# Patient Record
Sex: Female | Born: 1970 | Race: White | Hispanic: No | Marital: Married | Smoking: Never smoker
Health system: Southern US, Community
[De-identification: ages and names within clinical notes are randomized; demographics above are authoritative.]

## PROBLEM LIST (undated history)

## (undated) DIAGNOSIS — F32A Depression, unspecified: Secondary | ICD-10-CM

## (undated) DIAGNOSIS — E039 Hypothyroidism, unspecified: Secondary | ICD-10-CM

## (undated) DIAGNOSIS — E282 Polycystic ovarian syndrome: Secondary | ICD-10-CM

## (undated) DIAGNOSIS — F329 Major depressive disorder, single episode, unspecified: Secondary | ICD-10-CM

## (undated) DIAGNOSIS — R7303 Prediabetes: Secondary | ICD-10-CM

## (undated) DIAGNOSIS — F341 Dysthymic disorder: Secondary | ICD-10-CM

## (undated) HISTORY — PX: ENDOMETRIAL ABLATION: SHX621

## (undated) HISTORY — DX: Prediabetes: R73.03

## (undated) HISTORY — DX: Depression, unspecified: F32.A

## (undated) HISTORY — PX: FACIAL COSMETIC SURGERY: SHX629

## (undated) HISTORY — DX: Major depressive disorder, single episode, unspecified: F32.9

## (undated) HISTORY — DX: Polycystic ovarian syndrome: E28.2

## (undated) HISTORY — DX: Dysthymic disorder: F34.1

## (undated) HISTORY — PX: CARPAL TUNNEL RELEASE: SHX101

## (undated) HISTORY — DX: Hypothyroidism, unspecified: E03.9

## (undated) HISTORY — PX: COSMETIC SURGERY: SHX468

## (undated) HISTORY — PX: WISDOM TOOTH EXTRACTION: SHX21

## (undated) HISTORY — PX: FOOT SURGERY: SHX648

## (undated) HISTORY — PX: DILATION AND CURETTAGE OF UTERUS: SHX78

---

## 1997-11-16 DIAGNOSIS — F341 Dysthymic disorder: Secondary | ICD-10-CM

## 1997-11-16 HISTORY — DX: Dysthymic disorder: F34.1

## 1997-12-18 ENCOUNTER — Inpatient Hospital Stay (HOSPITAL_COMMUNITY): Admission: AD | Admit: 1997-12-18 | Discharge: 1997-12-18 | Payer: Self-pay | Admitting: Obstetrics and Gynecology

## 1998-04-19 ENCOUNTER — Inpatient Hospital Stay (HOSPITAL_COMMUNITY): Admission: AD | Admit: 1998-04-19 | Discharge: 1998-04-30 | Payer: Self-pay | Admitting: Obstetrics and Gynecology

## 1998-05-01 ENCOUNTER — Encounter (HOSPITAL_COMMUNITY): Admission: RE | Admit: 1998-05-01 | Discharge: 1998-07-30 | Payer: Self-pay | Admitting: *Deleted

## 1998-05-29 ENCOUNTER — Other Ambulatory Visit: Admission: RE | Admit: 1998-05-29 | Discharge: 1998-05-29 | Payer: Self-pay | Admitting: Obstetrics and Gynecology

## 1998-08-15 ENCOUNTER — Encounter (HOSPITAL_COMMUNITY): Admission: RE | Admit: 1998-08-15 | Discharge: 1998-09-12 | Payer: Self-pay | Admitting: *Deleted

## 1998-09-04 ENCOUNTER — Encounter (HOSPITAL_COMMUNITY): Admission: RE | Admit: 1998-09-04 | Discharge: 1998-12-03 | Payer: Self-pay | Admitting: *Deleted

## 1998-11-16 DIAGNOSIS — E282 Polycystic ovarian syndrome: Secondary | ICD-10-CM

## 1998-11-16 HISTORY — DX: Polycystic ovarian syndrome: E28.2

## 1999-03-21 ENCOUNTER — Encounter: Admission: RE | Admit: 1999-03-21 | Discharge: 1999-06-19 | Payer: Self-pay | Admitting: Endocrinology

## 2000-02-02 ENCOUNTER — Encounter: Admission: RE | Admit: 2000-02-02 | Discharge: 2000-02-02 | Payer: Self-pay | Admitting: Neurosurgery

## 2000-02-02 ENCOUNTER — Encounter: Payer: Self-pay | Admitting: Neurosurgery

## 2000-03-31 ENCOUNTER — Other Ambulatory Visit: Admission: RE | Admit: 2000-03-31 | Discharge: 2000-03-31 | Payer: Self-pay | Admitting: Obstetrics and Gynecology

## 2000-09-10 ENCOUNTER — Encounter (INDEPENDENT_AMBULATORY_CARE_PROVIDER_SITE_OTHER): Payer: Self-pay | Admitting: Specialist

## 2000-09-10 ENCOUNTER — Other Ambulatory Visit: Admission: RE | Admit: 2000-09-10 | Discharge: 2000-09-10 | Payer: Self-pay | Admitting: Gynecology

## 2001-11-10 ENCOUNTER — Ambulatory Visit (HOSPITAL_COMMUNITY): Admission: RE | Admit: 2001-11-10 | Discharge: 2001-11-10 | Payer: Self-pay | Admitting: Endocrinology

## 2002-03-21 ENCOUNTER — Encounter: Payer: Self-pay | Admitting: Family Medicine

## 2002-03-21 ENCOUNTER — Ambulatory Visit (HOSPITAL_COMMUNITY): Admission: RE | Admit: 2002-03-21 | Discharge: 2002-03-21 | Payer: Self-pay | Admitting: Family Medicine

## 2002-05-08 ENCOUNTER — Inpatient Hospital Stay (HOSPITAL_COMMUNITY): Admission: AD | Admit: 2002-05-08 | Discharge: 2002-05-08 | Payer: Self-pay | Admitting: Gynecology

## 2002-05-12 ENCOUNTER — Encounter (INDEPENDENT_AMBULATORY_CARE_PROVIDER_SITE_OTHER): Payer: Self-pay | Admitting: *Deleted

## 2002-05-12 ENCOUNTER — Ambulatory Visit (HOSPITAL_BASED_OUTPATIENT_CLINIC_OR_DEPARTMENT_OTHER): Admission: RE | Admit: 2002-05-12 | Discharge: 2002-05-12 | Payer: Self-pay

## 2003-02-09 ENCOUNTER — Encounter: Payer: Self-pay | Admitting: Family Medicine

## 2003-02-09 ENCOUNTER — Ambulatory Visit (HOSPITAL_COMMUNITY): Admission: RE | Admit: 2003-02-09 | Discharge: 2003-02-09 | Payer: Self-pay | Admitting: Family Medicine

## 2004-02-29 ENCOUNTER — Ambulatory Visit (HOSPITAL_COMMUNITY): Admission: RE | Admit: 2004-02-29 | Discharge: 2004-02-29 | Payer: Self-pay | Admitting: Obstetrics and Gynecology

## 2004-03-24 ENCOUNTER — Other Ambulatory Visit: Admission: RE | Admit: 2004-03-24 | Discharge: 2004-03-24 | Payer: Self-pay | Admitting: Obstetrics and Gynecology

## 2005-01-03 ENCOUNTER — Emergency Department (HOSPITAL_COMMUNITY): Admission: EM | Admit: 2005-01-03 | Discharge: 2005-01-03 | Payer: Self-pay | Admitting: Emergency Medicine

## 2005-03-16 HISTORY — PX: NECK SURGERY: SHX720

## 2005-03-20 ENCOUNTER — Ambulatory Visit (HOSPITAL_COMMUNITY): Admission: RE | Admit: 2005-03-20 | Discharge: 2005-03-20 | Payer: Self-pay | Admitting: Neurosurgery

## 2005-04-21 ENCOUNTER — Encounter: Admission: RE | Admit: 2005-04-21 | Discharge: 2005-04-21 | Payer: Self-pay | Admitting: Neurosurgery

## 2005-04-27 ENCOUNTER — Emergency Department (HOSPITAL_COMMUNITY): Admission: EM | Admit: 2005-04-27 | Discharge: 2005-04-28 | Payer: Self-pay | Admitting: Emergency Medicine

## 2005-05-12 ENCOUNTER — Ambulatory Visit (HOSPITAL_COMMUNITY): Admission: RE | Admit: 2005-05-12 | Discharge: 2005-05-12 | Payer: Self-pay | Admitting: Family Medicine

## 2005-05-15 ENCOUNTER — Ambulatory Visit (HOSPITAL_COMMUNITY): Admission: RE | Admit: 2005-05-15 | Discharge: 2005-05-15 | Payer: Self-pay | Admitting: Family Medicine

## 2005-05-22 ENCOUNTER — Encounter: Admission: RE | Admit: 2005-05-22 | Discharge: 2005-05-22 | Payer: Self-pay | Admitting: Neurosurgery

## 2005-05-28 ENCOUNTER — Encounter (HOSPITAL_COMMUNITY): Admission: RE | Admit: 2005-05-28 | Discharge: 2005-06-27 | Payer: Self-pay | Admitting: Neurosurgery

## 2005-07-02 ENCOUNTER — Encounter (HOSPITAL_COMMUNITY): Admission: RE | Admit: 2005-07-02 | Discharge: 2005-08-01 | Payer: Self-pay | Admitting: Neurosurgery

## 2006-02-02 ENCOUNTER — Encounter
Admission: RE | Admit: 2006-02-02 | Discharge: 2006-02-02 | Payer: Self-pay | Admitting: Physical Medicine and Rehabilitation

## 2006-03-18 ENCOUNTER — Ambulatory Visit (HOSPITAL_COMMUNITY): Admission: RE | Admit: 2006-03-18 | Discharge: 2006-03-18 | Payer: Self-pay | Admitting: Neurosurgery

## 2006-12-23 ENCOUNTER — Ambulatory Visit (HOSPITAL_COMMUNITY): Admission: RE | Admit: 2006-12-23 | Discharge: 2006-12-23 | Payer: Self-pay | Admitting: Obstetrics and Gynecology

## 2007-03-07 ENCOUNTER — Inpatient Hospital Stay (HOSPITAL_COMMUNITY): Admission: AD | Admit: 2007-03-07 | Discharge: 2007-03-08 | Payer: Self-pay | Admitting: Obstetrics and Gynecology

## 2007-03-15 ENCOUNTER — Encounter (INDEPENDENT_AMBULATORY_CARE_PROVIDER_SITE_OTHER): Payer: Self-pay | Admitting: Specialist

## 2007-03-15 ENCOUNTER — Ambulatory Visit (HOSPITAL_COMMUNITY): Admission: RE | Admit: 2007-03-15 | Discharge: 2007-03-15 | Payer: Self-pay | Admitting: Obstetrics and Gynecology

## 2008-06-21 ENCOUNTER — Encounter (INDEPENDENT_AMBULATORY_CARE_PROVIDER_SITE_OTHER): Payer: Self-pay | Admitting: Obstetrics and Gynecology

## 2008-06-21 ENCOUNTER — Ambulatory Visit (HOSPITAL_COMMUNITY): Admission: RE | Admit: 2008-06-21 | Discharge: 2008-06-21 | Payer: Self-pay | Admitting: Obstetrics and Gynecology

## 2010-12-07 ENCOUNTER — Encounter: Payer: Self-pay | Admitting: Family Medicine

## 2011-03-31 NOTE — Op Note (Signed)
[name], Bonnie [name]             ACCOUNT NO.:  [id number]   MEDICAL RECORD NO.:  [id number]          PATIENT TYPE:  AMB   LOCATION:  SDC                           FACILITY:  [facility]   PHYSICIAN:  Guy Sandifer. Henderson Cloud, M.D. DATE OF BIRTH:  [birth year]   DATE OF PROCEDURE:  06/21/2008  DATE OF DISCHARGE:                               OPERATIVE REPORT   PREOPERATIVE DIAGNOSIS:  Menometrorrhagia.   POSTOPERATIVE DIAGNOSIS:  Menometrorrhagia.   PROCEDURE:  Hysteroscopy, dilatation and curettage.   SURGEON:  Guy Sandifer. Henderson Cloud, MD   ANESTHESIA:  MAC.   SPECIMEN:  Endometrial curettings to pathology.   ESTIMATED BLOOD LOSS:  Minimal.   I&O'S AND SORBITOL DISTENDING MEDIA:  0 mL deficit.   INDICATIONS AND CONSENT:  This patient is a 40 year old white female  with heavy irregular menses.  Ultrasound in the office was suspicious  for polyps versus clot in the uterus.  Hysteroscopy and D&C is discussed  preoperatively.  Potential risks and complications were reviewed  including, but not limited to infection, uterine perforation, organ  damage, bleeding requiring transfusion of blood products with possible  transfusion reaction, HIV and hepatitis acquisition, DVT, PE, pneumonia,  recurrent abnormal bleeding, laparoscopy, and laparotomy.  All questions  were answered, and consent is signed on the chart.   FINDINGS:  Uterine cavity is without abnormal structure.   PROCEDURE:  The patient was taken to the operating room, where she was  identified, placed in dorsal supine position, and intravenous sedation  was given.  She was then placed in the dorsal lithotomy position, where  she was gently prepped, bladder straight catheterized, and she was  draped in a sterile fashion.  She did receive IV antibiotics.  Bivalve  speculum was placed in the vagina and anterior cervical lip was injected  with 1% plain Xylocaine.  This was then grasped with a single-tooth  tenaculum.  Paracervical block was  placed at 2, 4, 5, 7, 8, and 10  o'clock positions with approximately 20 mL total of 1% plain Xylocaine.  Cervix was gently progressively dilated with a 33 dilator.  The  resectoscope with a single right-angle wire loop was placed in the  endocervical canal, advanced under direct visualization with sorbitol  distending media.  The above findings were noted.  Hysteroscope was  withdrawn and then sharp curettage was carried out.  The hysteroscope  was again placed in the endocervical canal and  advanced under direct visualization.  Again, no abnormal structures were  noted in the endometrial canal.  The scope was then removed.  Procedure  was terminated.  All instruments were removed.  All counts were correct.  The patient was awakened and taken to recovery room in stable condition.      Guy Sandifer Henderson Cloud, M.D.  Electronically Signed     JET/MEDQ  D:  06/21/2008  T:  06/22/2008  Job:  [id number]

## 2011-04-03 NOTE — Op Note (Signed)
[facility]  Patient:    Bonnie [name], Bonnie [name] Visit Number: [id number] MRN: [id number]          Service Type: DSU Location: [facility] Attending Physician:  Meredith Leeds Dictated by:   Zigmund Daniel, M.D. Proc. Date: 05/12/02 Admit Date:  05/12/2002 Discharge Date: 05/12/2002   CC:         Leatha Gilding. Mezer, M.D.   Operative Report  PREOPERATIVE DIAGNOSIS:  Painful mass of the abdominal wall.  POSTOPERATIVE DIAGNOSIS:  Painful mass of the abdominal wall.  OPERATION PERFORMED:  Excision of mass.  SURGEON:  Zigmund Daniel, M.D.  ANESTHESIA:  General.  DESCRIPTION OF PROCEDURE:  After induction of anesthesia and routine preparation and draping of the abdomen, I palpated the mass which was above and to the left of the end of the Pfannenstiel incision.  I made an incision from the end of the Pfannenstiel incision laterally 3 to 4 cm and dissected down through the subcutaneous tissues toward the mass.  The mass was encountered in the deep subcutaneous tissues attached to the muscles of the abdominal wall and was quite firm and seemed to have a lot of scar tissue in it.  As I attempted to dissect around it, I entered it and some old what appeared to be bloody fluid drained out.  I then removed the mass right down to the abdominal wall muscles and found that it actually seemed to involve the muscles and tissues deep to it and so I excised that back to soft tissue which appeared to be free of any abnormality.  I then closed the muscles with 2-0 Prolene suture and approximated the deep subcutaneous tissues over that and closed the skin with intercuticular 4-0 Vicryl suture and Steri-Strips.  The patient tolerated the procedure well. Dictated by:   Zigmund Daniel, M.D. Attending Physician:  Meredith Leeds DD:  05/12/02 TD:  05/15/02 Job: [id number]

## 2011-04-03 NOTE — Op Note (Signed)
NAMECLETIS, MUMA             ACCOUNT NO.:  [id number]   MEDICAL RECORD NO.:  [id number]          PATIENT TYPE:  AMB   LOCATION:  SDC                           FACILITY:  [facility]   PHYSICIAN:  Guy Sandifer. Henderson Cloud, M.D. DATE OF BIRTH:  July 04, 1971   DATE OF PROCEDURE:  03/15/2007  DATE OF DISCHARGE:                               OPERATIVE REPORT   PREOPERATIVE DIAGNOSIS:  Missed abortion.   POSTOPERATIVE DIAGNOSIS:  Missed abortion.   PROCEDURE:  1. Dilatation evacuation.  2. 1% Xylocaine paracervical block.   SURGEON:  Guy Sandifer. Henderson Cloud, M.D.   ANESTHESIA:  MAC.   SPECIMENS:  Products of conception to pathology.   ESTIMATED BLOOD LOSS:  50 mL.   INDICATIONS AND CONSENT:  This patient is a 40 year old married white  female with an ultrasound revealing a crown-rump length of 10 weeks and  no fetal heartbeat.  After discussion of options, she is admitted for  dilatation evacuation.  Potential risks and complications discussed  preoperatively including but not limited to infection, uterine  perforation, organ damage, bleeding requiring transfusion of blood  products with possible transfusion reaction, HIV and hepatitis  acquisition, DVT, PE, pneumonia, intrauterine synechia, secondary  infertility, hysterectomy, laparotomy.  All questions were answered and  consent is signed on the chart.   PROCEDURE:  The patient taken to operating room where she is identified,  placed in dorsosupine position and she is given intravenous sedation.  She is then placed in dorsal lithotomy position where she is prepped,  bladder straight catheterized.  She is draped in sterile fashion.  Bivalve speculum was placed in vagina and the anterior cervical lip is  injected with 1% Xylocaine and grasped with single-tooth tenaculum.  Cervix was then gently dilated to a 29 dilator.  #9 curved curette was  placed in the endometrial cavity and suction curettage is carried out  for obvious products of  conception.  After the first passage of the  suction curette, bag of fluids with 20 units of Pitocin is started in  the IV.  Alternating sharp and suction curettage is carried out until  the cavity is clean.  The patient is given Methergine 0.2 mg IM at the  conclusion of the case as well.  All instruments were removed.  All  counts were correct.  Good hemostasis was noted.  The patient is taken  to the recovery room in stable condition.  Blood type is Rh positive.     Guy Sandifer Henderson Cloud, M.D.  Electronically Signed    JET/MEDQ  D:  03/15/2007  T:  03/16/2007  Job:  [id number]

## 2011-04-03 NOTE — Op Note (Signed)
NAMEVALORA, NORELL NO.:  [id number]   MEDICAL RECORD NO.:  [id number]          PATIENT TYPE:  OIB   LOCATION:  2899                         FACILITY:  [facility]   PHYSICIAN:  Donalee Citrin, M.D.        DATE OF BIRTH:  07-18-1971   DATE OF PROCEDURE:  03/20/2005  DATE OF DISCHARGE:                                 OPERATIVE REPORT   PREOPERATIVE DIAGNOSIS:  Cervical spondylosis, with right-sided C5-C6  radiculopathies.   PROCEDURE:  Anterior cervical diskectomy and fusion at C4-5 and C5-6 using a  6 mm LifeNet wedge at C4-5 and a 4mm LifeNet wedge at C5-6, and a 40 mm  Atlantis Vision plate with six 13 mm variable-angle screws.   SURGEON:  Donalee Citrin, M.D.   ASSISTANT:  Tia Alert, M.D.   ANESTHESIA:  General endotracheal.   HISTORY OF PRESENT ILLNESS:  The patient is a very pleasant 40 year old  female who has long-standing neck and right greater than left arm pain with  radiation down to the thumb and forefinger, with weakness in biceps and  triceps on preoperative exam.  Imaging showed spondylytic disease and  spondylytic compression of C5 and C6 nerve roots on the right, with a  central and rightward disk protrusion at C5-6.  The patient was recommended  anterior cervical diskectomy and fusion due to failure of all forms of  conservative treatment.  The patient understood the risks and benefits that  were explained to the patient.  She understands and agrees to proceed  forward.   The patient is brought to the OR and was induced under general anesthesia  and positioned supine, with the neck in slight extension in 5 pounds halter  traction.  The neck was prepped and draped in a routine sterile fashion.  Preoperative x-ray localized the C5-6 disk space.  A curvilinear incision  was made just off the midline to the anterior border of the  sternocleidomastoid.  The superficial layer of the platysma was dissected  out and divided longitudinally.  The avascular  plane between the  sternocleidomastoid and strap muscles was developed down to prevertebral  fascia.  The prevertebral fascia was dissected with the Kitners.  Intraoperative x-ray confirmed localization at C5-6 disk space.  Annulotomy  was made and marked at the disk space at C5-6 as well as C4-5.  The  remainder of __________ was reflected laterally.  A self-retaining retractor  was placed.  Annulotomies were extended.  The anterior margin of the annulus  was removed with pituitary rongeurs.  Then, using 2 and 3 mm Kerrison  punches, the anterior osteophytes were bitten off the C4 and C5 vertebral  bodies.  I exposed the disk space.  Then, using high-speed drill, both  interspaces were drilled down to the posterior annulus and the posterior  longitudinal ligament.  Then, the operating microscope was draped and  brought into the field.  With microscopic illumination for C5-6, posterior  annulus and posterior longitudinal ligament were removed in a piecemeal  fashion with the 1 and 2 mm Kerrison punches, exposing the thecal sac.  There was  noted to be soft disk herniation compressing the right side of the  thecal sac and extending to the foramen.  This was all removed in a  piecemeal fashion.  Then, the uncinate process was noted to be  hypertrophied.  It was underbitten with a 1 and 2 mm Kerrison punch,  decompressing the thecal sac and both C6 neural foramina.  Both foramina  were explored with angled nerve hook and had no further stenosis.  Then,  Gelfoam was placed in the disk space.  Attention was taken to C4-5.  The  procedure was repeated with the 1 and 2 mm Kerrison punches, removing the  posterior annulus and the posterior longitudinal ligament, exposing the  thecal sac.  There was noted to be osteophyte complex covering the C4  vertebral body, compressing the right side of the thecal sac at this level.  This was all underbitten, decompressing the central canal, as well as both  C5  neural foramina.  At the end of the diskectomy, there was no further  stenosis on the thecal sac or the C5 nerve roots.  Then, attention was taken  to the interspaces that were measured, scraped with a BA curette.  A 6 mm  graft was inserted at C4-5, and initially a 5 mm graft at C5-6.  However,  this graft fractured, and this was removed, and a C4 graft was inserted.  Both grafts were inserted 1-2 mm deep to the anterior vertebral body line,  and the 40 mm Atlantis Vision plate was sized, selected, and inserted.  Six  13 mm variable-angle screws were placed.  Set screws tightened.  The wound  was copiously irrigated.  Meticulous hemostasis was maintained.  The  platysma was reapproximated with 3-0 interrupted Vicryl, and the skin was  closed with a running 4-0 subcuticular.  Benzoin and Steri-Strips applied.  The patient went to the recovery room in stable condition.  The needle,  instrument, and sponge counts were correct.      GC/MEDQ  D:  03/20/2005  T:  03/20/2005  Job:  [id number]

## 2011-04-03 NOTE — H&P (Signed)
Bonnie [name], Bonnie [name]             ACCOUNT NO.:  [id number]   MEDICAL RECORD NO.:  [id number]          PATIENT TYPE:  MAT   LOCATION:  MATC                          FACILITY[facility]   PHYSICIAN:  Guy Sandifer. Henderson Cloud, M.D. DATE OF BIRTH:  Feb 13, 1971   DATE OF ADMISSION:  03/07/2007  DATE OF DISCHARGE:                              HISTORY & PHYSICAL   CHIEF COMPLAINT:  Possible incompetent cervix.   HISTORY OF PRESENT ILLNESS:  This patient is a 40 year old, married,  white female, G2, P1, with a history of admission with her first  pregnancy in 1999 at approximately 24-1/2 weeks with low amniotic fluid  and a question of oligohydramnios.  She later had frank rupture of  membranes and was taken to the operating room laboring with a breech  presentation at approximately 26 weeks for delivery.  For this  pregnancy, an ultrasound on 02/14/2007 was consistent with 6 weeks and 4  days giving her an EDC of 10/06/2007.  At that ultrasound, a 6 cm simple  cyst was noted rising from the left adnexa.  Subsequent ultrasound on  03/02/2007 revealed the left ovarian cyst to be resolving and now  measured 3.6 cm.  Cervix appeared to be normal at that time.  Over the  last couple of days over the weekend, the patient has had some spotting  and some uterine contraction-type cramping.  Ultrasound in the office  today confirms a fetal heart rate of 172 beats a minute.  The cervix is  funneling from the inside, and there is 8 mm of measurable closed  cervical length.  There is subchorionic hemorrhage and some blood seen  within the cervical canal.  The patient is being admitted to the  hospital for Trendelenburg and strict bedrest, possible cerclage  tomorrow.   PAST MEDICAL HISTORY:  1. History of migraine headache.  2. History of depression.  3. History of hypothyroidism.   PAST SURGICAL HISTORY:  1. Neck surgery in 2006.  2. Laparoscopy in 1990 revealing dense pelvic adhesions.   OBSTETRIC  HISTORY:  Classical cesarean section at approximately 26 weeks  in 1999.   FAMILY HISTORY:  Heart attack in paternal grandfather.  Chronic  hypertension in father.  Varicose veins in mother.  Insulin-dependent  diabetes in paternal grandmother.  Thyroid dysfunction in father.   MEDICATIONS:  Synthroid 100 mcg daily, Wellbutrin daily, prenatal  vitamins.   ALLERGIES:  QUESTION OF AN AUGMENTIN ALLERGY.   SOCIAL HISTORY:  She denies tobacco, alcohol or drug abuse.   REVIEW OF SYSTEMS:  NEURO:  History of headache as above.  CARDIAC:  Denied chest pain.  PULMONARY:  Denies shortness of breath.  GI:  Denies recent changes in bowel habits.   PHYSICAL EXAMINATION:  VITAL SIGNS:  Height 5 feet 6 inches, weight  197.8 pounds, blood pressure 112/72.  LUNGS:  Clear to auscultation.  HEART:  Regular rate and rhythm.  ABDOMEN:  Soft and nontender.  PELVIC:  Exam is deferred.  EXTREMITIES:  Grossly within normal limits.  NEUROLOGIC:  Grossly within normal limits.   LABORATORY:  Blood type is O positive.  ASSESSMENT:  Probable incompetent cervix.   PLAN:  Bedrest.  We will reevaluate the cervix in the morning with  ultrasound and possible cervical cerclage.      Guy Sandifer Henderson Cloud, M.D.  Electronically Signed     JET/MEDQ  D:  03/07/2007  T:  03/07/2007  Job:  [id number]

## 2011-08-14 LAB — COMPREHENSIVE METABOLIC PANEL
Albumin: 3.9
Alkaline Phosphatase: 43
BUN: 7
Chloride: 101
GFR calc Af Amer: >60
GFR calc non Af Amer: >60
Potassium: 3.9
Total Bilirubin: 0.8

## 2011-08-14 LAB — CBC
Hemoglobin: 13.4
MCV: 100.1 — ABNORMAL HIGH
Platelets: 307
RDW: 14.1

## 2012-09-19 ENCOUNTER — Ambulatory Visit (HOSPITAL_COMMUNITY)
Admission: RE | Admit: 2012-09-19 | Discharge: 2012-09-19 | Disposition: A | Discharge status: Home / Self Care | Payer: BC Managed Care – PPO | Source: Ambulatory Visit | Attending: Gastroenterology | Admitting: Gastroenterology

## 2012-09-19 ENCOUNTER — Encounter: Payer: Self-pay | Admitting: Gastroenterology

## 2012-09-19 ENCOUNTER — Ambulatory Visit (INDEPENDENT_AMBULATORY_CARE_PROVIDER_SITE_OTHER): Payer: BC Managed Care – PPO | Admitting: Gastroenterology

## 2012-09-19 ENCOUNTER — Other Ambulatory Visit: Payer: Self-pay | Admitting: Gastroenterology

## 2012-09-19 VITALS — BP 124/79 | HR 84 | Temp 98.4°F | Ht 66.0 in | Wt 228.8 lb

## 2012-09-19 DIAGNOSIS — R109 Unspecified abdominal pain: Secondary | ICD-10-CM

## 2012-09-19 DIAGNOSIS — R1031 Right lower quadrant pain: Secondary | ICD-10-CM | POA: Insufficient documentation

## 2012-09-19 DIAGNOSIS — K5710 Diverticulosis of small intestine without perforation or abscess without bleeding: Secondary | ICD-10-CM | POA: Insufficient documentation

## 2012-09-19 DIAGNOSIS — K625 Hemorrhage of anus and rectum: Secondary | ICD-10-CM

## 2012-09-19 LAB — PREGNANCY, URINE: Preg Test, Ur: NEGATIVE

## 2012-09-19 NOTE — Progress Notes (Signed)
Primary Care Physician:  Lilyan Punt, MD Primary Gastroenterologist:  Dr. Darrick Penna   Chief Complaint  Patient presents with  . Abdominal Pain    HPI:   41 year old female, self-referred secondary to abdominal pain, rectal bleeding. She is actually the cousin of one of the co-workers in the office, Diana Eves.   Been on period for over a year. Went last Thursday to discuss hysterectomy options, etc. Tentatively scheduled for Nov. Fri am about 4 am, woke up with abdominal cramping, increasing in severity, worse than labor pains. Was on toilet, got nauseated, started sweating, felt like couldn't breathe but was short breaths.Had a BM (very soft), felt like something needed to come out of rectum or vagina. In the afternoon cramping started again, not as bad, had BM again. Yesterday no pain. This morning started cramping again, like a contraction, enough to need to hold abdomen. When started hurting again, had another BM.   Saturday evening, felt like had gas, went to strain, thought had gone to bathroom on self. Looked in pants and was mucusy, gel-like blood, happened X 3. Like a blob. Was in her pad. When would wipe, could tell it was blood.   GYN calling progesterone in for the bleeding. Had a procedure with GYN (dye into uterus) to check for source of bleeding.   Felt feverish the morning it happened, felt cold but was sweating. Scared to eat. States felt like eating was associated one day, started having pain a few hours later after eating. Feels like when it is digesting, starts to act up. Today ate cheese and crackers. No n/v. No sick contacts. No rectal pain, itching. Has had issues with rectal pain and itching in remote past.   No NSAIDs routine. Been taking Pamprin religiously.   Bowel habits unpredictable. Sometimes every day, sometimes every 2-3 days, sometimes 3 times in one day. Hates milk.   Past Medical History  Diagnosis Date  . Depression   . Hypothyroidism     Past  Surgical History  Procedure Date  . Facial cosmetic surgery     oralmaxillofacial surgery, broke jaws   . Cesarean section   . Dilation and curettage of uterus   . Wisdom tooth extraction     Current Outpatient Prescriptions  Medication Sig Dispense Refill  . amphetamine-dextroamphetamine (ADDERALL) 20 MG tablet Take 20 mg by mouth 2 (two) times daily.      Marland Kitchen APAP-Parabrom-Pyrilamine (PAMPRIN MAX PAIN FORMULA PO) Take by mouth daily as needed.      Marland Kitchen aspirin 81 MG tablet Take 81 mg by mouth as needed.      Marland Kitchen buPROPion (WELLBUTRIN SR) 150 MG 12 hr tablet Take 150 mg by mouth 2 (two) times daily.      Marland Kitchen levothyroxine (SYNTHROID, LEVOTHROID) 125 MCG tablet Take 125 mcg by mouth daily.        Allergies as of 09/19/2012 - Review Complete 09/19/2012  Allergen Reaction Noted  . Iohexol  12/22/2006    Family History  Problem Relation Age of Onset  . Colon cancer Neg Hx     History   Social History  . Marital Status: Married    Spouse Name: N/A    Number of Children: 1  . Years of Education: N/A   Occupational History  . stay at home    Social History Main Topics  . Smoking status: Never Smoker   . Smokeless tobacco: Not on file  . Alcohol Use: No  . Drug Use: No  . Sexually  Active: Not on file   Other Topics Concern  . Not on file   Social History Narrative  . No narrative on file    Review of Systems: Gen: Denies any fever, chills, fatigue, weight loss, lack of appetite.  CV: Denies chest pain, heart palpitations, peripheral edema, syncope.  Resp: Denies shortness of breath at rest or with exertion. Denies wheezing or cough.  GI: Denies dysphagia or odynophagia. Denies jaundice, hematemesis, fecal incontinence. GU : Denies urinary burning, urinary frequency, urinary hesitancy MS: Denies joint pain, muscle weakness, cramps, or limitation of movement.  Derm: Denies rash, itching, dry skin Psych: Denies depression, anxiety, memory loss, and confusion Heme: Denies  bruising, bleeding, and enlarged lymph nodes.  Physical Exam: BP 124/79  Pulse 84  Temp 98.4 F (36.9 C) (Temporal)  Ht 5\' 6"  (1.676 m)  Wt 228 lb 12.8 oz (103.783 kg)  BMI 36.93 kg/m2  LMP 09/19/2012 General:   Alert and oriented. Pleasant and cooperative. Well-nourished and well-developed.  Head:  Normocephalic and atraumatic. Eyes:  Without icterus, sclera clear and conjunctiva pink.  Ears:  Normal auditory acuity. Nose:  No deformity, discharge,  or lesions. Mouth:  No deformity or lesions, oral mucosa pink.  Neck:  Supple, without mass or thyromegaly. Lungs:  Clear to auscultation bilaterally. No wheezes, rales, or rhonchi. No distress.  Heart:  S1, S2 present without murmurs appreciated.  Abdomen:  +BS, soft, SIGNIFICANTLY TTP RLQ, jumped off table. non-distended. No HSM noted.LLQ mild discomfort Rectal:  Deferred  Msk:  Symmetrical without gross deformities. Normal posture. Extremities:  Without clubbing or edema. Neurologic:  Alert and  oriented x4;  grossly normal neurologically. Skin:  Intact without significant lesions or rashes. Cervical Nodes:  No significant cervical adenopathy. Psych:  Alert and cooperative. Normal mood and affect.

## 2012-09-19 NOTE — Patient Instructions (Addendum)
We have set you up for a CT scan today.   Further recommendations once this is completed.

## 2012-09-19 NOTE — Progress Notes (Signed)
Quick Note:  Reviewed with pt.  Unclear etiology of lower abdominal pain. ?GYN related at this point.  Needs TCS due to hematochezia with Dr. Darrick Penna in near future.  Please cc Dr. Huntley Dec (sp?) with [facility].    ______

## 2012-09-20 ENCOUNTER — Ambulatory Visit: Payer: Self-pay | Admitting: Gastroenterology

## 2012-09-20 ENCOUNTER — Other Ambulatory Visit: Payer: Self-pay | Admitting: Gastroenterology

## 2012-09-20 ENCOUNTER — Encounter (HOSPITAL_COMMUNITY): Payer: Self-pay | Admitting: Pharmacy Technician

## 2012-09-20 DIAGNOSIS — K921 Melena: Secondary | ICD-10-CM

## 2012-09-20 MED ORDER — PEG 3350-KCL-NA BICARB-NACL 420 G PO SOLR: 4000.0000 mL | ORAL | Status: DC

## 2012-09-20 NOTE — Progress Notes (Signed)
Patient is scheduled for TCS w/[facility] on Tuesday October 12th and I have mailed her instructions and she is aware

## 2012-09-20 NOTE — Progress Notes (Signed)
Faxed to PCP

## 2012-09-20 NOTE — Progress Notes (Signed)
Patient is scheduled with [facility] for TCS on Tuesday Oct 12 I have mailed her instructions and she is aware

## 2012-09-22 ENCOUNTER — Telehealth: Payer: Self-pay | Admitting: Gastroenterology

## 2012-09-22 DIAGNOSIS — K625 Hemorrhage of anus and rectum: Secondary | ICD-10-CM | POA: Insufficient documentation

## 2012-09-22 DIAGNOSIS — R109 Unspecified abdominal pain: Secondary | ICD-10-CM | POA: Insufficient documentation

## 2012-09-22 NOTE — Assessment & Plan Note (Signed)
41 year old female with acute onset of severe lower abdominal pain, followed by several normal BMs, no diarrhea. "blobs" of blood several times the next day. Worse than labor. Hx somewhat complicated as she has been having GYN issues with chronic bleeding. Although she notes moderate rectal bleeding, the two may or may not be related. Concerning on physical exam is the extreme TTP in RLQ. Differentials vast at this point. CT abd/pelvis stat today. Needs TCS in near future. Needs close GYN f/u as well.

## 2012-09-22 NOTE — Progress Notes (Signed)
CT: IMPRESSION:  1. Suspected left ovarian cyst.  2. Progressive facet arthropathy and endplate sclerosis at L4-5  with grade 1 anterolisthesis at L4-5. There is also progressive  degenerative facet arthropathy at L5-S1.  3. Diverticulum of the transverse duodenum.  4. No renal stone. Appendix unremarkable.  I discussed this with pt around 9pm after CT scan completed. No further rectal bleeding. TCS to be scheduled in near future.

## 2012-09-22 NOTE — Telephone Encounter (Signed)
Patient called to cancel her TCS. She is having a DNC and ultrasound done tomorrow and wants to hold off until she gets this done first. Patient will call us back to [facility]

## 2012-09-22 NOTE — Progress Notes (Signed)
Faxed to PCP

## 2012-09-24 NOTE — Progress Notes (Signed)
REVIEWED.  CT: NAIAP. TCS W/ CS WITHIN THE NEXT 7 DAYS FOR RECTAL BLEEDING

## 2012-09-27 ENCOUNTER — Ambulatory Visit (HOSPITAL_COMMUNITY)
Admission: RE | Admit: 2012-09-27 | Payer: BC Managed Care – PPO | Source: Ambulatory Visit | Admitting: Gastroenterology

## 2012-09-27 ENCOUNTER — Encounter (HOSPITAL_COMMUNITY): Admission: RE | Payer: Self-pay | Source: Ambulatory Visit

## 2012-09-27 SURGERY — COLONOSCOPY
Anesthesia: Moderate Sedation

## 2012-11-18 NOTE — Progress Notes (Signed)
Please contact pt to see how she is doing. Needs f/u with [place] for ?TCS.

## 2012-11-21 NOTE — Progress Notes (Signed)
LMOM to call.

## 2012-11-22 NOTE — Progress Notes (Signed)
LMOM to call. Mailing a letter to call also.  

## 2012-11-30 NOTE — Progress Notes (Signed)
LMOM to call.

## 2013-05-19 ENCOUNTER — Emergency Department (HOSPITAL_COMMUNITY): Payer: Managed Care, Other (non HMO)

## 2013-05-19 ENCOUNTER — Emergency Department (HOSPITAL_COMMUNITY)
Admission: EM | Admit: 2013-05-19 | Discharge: 2013-05-19 | Disposition: A | Discharge status: Home / Self Care | Payer: Managed Care, Other (non HMO) | Attending: Emergency Medicine | Admitting: Emergency Medicine

## 2013-05-19 ENCOUNTER — Encounter (HOSPITAL_COMMUNITY): Payer: Self-pay | Admitting: *Deleted

## 2013-05-19 DIAGNOSIS — F3289 Other specified depressive episodes: Secondary | ICD-10-CM | POA: Insufficient documentation

## 2013-05-19 DIAGNOSIS — S86912A Strain of unspecified muscle(s) and tendon(s) at lower leg level, left leg, initial encounter: Secondary | ICD-10-CM

## 2013-05-19 DIAGNOSIS — Y929 Unspecified place or not applicable: Secondary | ICD-10-CM | POA: Insufficient documentation

## 2013-05-19 DIAGNOSIS — IMO0002 Reserved for concepts with insufficient information to code with codable children: Secondary | ICD-10-CM | POA: Insufficient documentation

## 2013-05-19 DIAGNOSIS — X500XXA Overexertion from strenuous movement or load, initial encounter: Secondary | ICD-10-CM | POA: Insufficient documentation

## 2013-05-19 DIAGNOSIS — E039 Hypothyroidism, unspecified: Secondary | ICD-10-CM | POA: Insufficient documentation

## 2013-05-19 DIAGNOSIS — Z79899 Other long term (current) drug therapy: Secondary | ICD-10-CM | POA: Insufficient documentation

## 2013-05-19 DIAGNOSIS — Y9389 Activity, other specified: Secondary | ICD-10-CM | POA: Insufficient documentation

## 2013-05-19 DIAGNOSIS — F329 Major depressive disorder, single episode, unspecified: Secondary | ICD-10-CM | POA: Insufficient documentation

## 2013-05-19 MED ORDER — OXYCODONE-ACETAMINOPHEN 5-325 MG PO TABS: 1.0000 | ORAL_TABLET | Freq: Four times a day (QID) | ORAL | Status: DC | PRN

## 2013-05-19 MED ORDER — HYDROMORPHONE HCL PF 1 MG/ML IJ SOLN
1.0000 mg | Freq: Once | INTRAMUSCULAR | Status: AC
  Administered 2013-05-19: 1 mg via INTRAMUSCULAR
  Filled 2013-05-19: qty 1

## 2013-05-19 NOTE — ED Notes (Addendum)
Pt was in a squatting position & went to stand up & had a cramping pain in her left leg.

## 2013-05-19 NOTE — ED Provider Notes (Signed)
History    This chart was scribed for [employer]. Rubin Payor, MD by Toya Smothers, ED Scribe. The patient was seen in room APA18/APA18. Patient's care was started at 2133.   CSN: [id number] Arrival date & time 05/19/13  2133  First MD Initiated Contact with Patient 05/19/13 2143     Chief Complaint  Patient presents with  . Leg Pain   Patient is a 42 y.o. female presenting with leg pain. The history is provided by the patient. No language interpreter was used.  Leg Pain Associated symptoms: no back pain and no fatigue     HPI Comments:   ARMYA WESTERHOFF is a 42 y.o. female with h/o hyperthyroidism and depression, brought in by ambulance who presents to the Emergency Department c/o 2 hours of new, sudden onset, constant lateral right leg pain. Pain is severe, cramping, and worse with movement. Onset occured whilst Pt was standing from a squatted position. Per Pt, "I could hear a pop as soon as I stood up, and I was knocked back immediately." No cephalic injury, LOC, dizziness, or confusion. Symptoms have not been treated PTA. Pt denies headache, diaphoresis, fever, chills, nausea, vomiting, diarrhea, weakness, cough, SOB and any other pain. Pt denies use of tobacco, alcohol, and illicit drug use.    Past Medical History  Diagnosis Date  . Depression   . Hypothyroidism    Past Surgical History  Procedure Laterality Date  . Facial cosmetic surgery      oralmaxillofacial surgery, broke jaws   . Cesarean section    . Dilation and curettage of uterus    . Wisdom tooth extraction    . Cosmetic surgery     Family History  Problem Relation Age of Onset  . Colon cancer Neg Hx    History  Substance Use Topics  . Smoking status: Never Smoker   . Smokeless tobacco: Not on file  . Alcohol Use: No    Review of Systems  Constitutional: Negative for appetite change and fatigue.  HENT: Negative for congestion, sinus pressure and ear discharge.   Eyes: Negative for discharge.   Respiratory: Negative for cough.   Cardiovascular: Negative for chest pain.  Gastrointestinal: Negative for abdominal pain and diarrhea.  Genitourinary: Negative for frequency and hematuria.  Musculoskeletal: Negative for back pain.  Skin: Negative for rash.  Neurological: Negative for seizures and headaches.  Psychiatric/Behavioral: Negative for hallucinations.    Allergies  Review of patient's allergies indicates no known allergies.  Home Medications   Current Outpatient Rx  Name  Route  Sig  Dispense  Refill  . amphetamine-dextroamphetamine (ADDERALL) 20 MG tablet   Oral   Take 20 mg by mouth daily as needed.          Marland Kitchen APAP-Parabrom-Pyrilamine (PAMPRIN MAX PAIN FORMULA PO)   Oral   Take by mouth daily as needed.         Marland Kitchen buPROPion (WELLBUTRIN SR) 150 MG 12 hr tablet   Oral   Take 150 mg by mouth 2 (two) times daily.         Marland Kitchen ibuprofen (ADVIL,MOTRIN) 200 MG tablet   Oral   Take 200 mg by mouth every 6 (six) hours as needed for pain.         Marland Kitchen levothyroxine (SYNTHROID, LEVOTHROID) 125 MCG tablet   Oral   Take 125 mcg by mouth every morning.          Marland Kitchen oxyCODONE-acetaminophen (PERCOCET/ROXICET) 5-325 MG per tablet   Oral  Take 1-2 tablets by mouth every 6 (six) hours as needed for pain.   12 tablet   0    BP 144/90  Pulse 82  Temp(Src) 98.2 F (36.8 C) (Oral)  Resp 20  Ht 5\' 6"  (1.676 m)  Wt 220 lb (99.791 kg)  BMI 35.53 kg/m2  SpO2 99%  LMP 05/10/2013 Physical Exam  Nursing note and vitals reviewed. Constitutional: She is oriented to person, place, and time. She appears well-developed and well-nourished. No distress.  HENT:  Head: Normocephalic and atraumatic.  Mouth/Throat: No oropharyngeal exudate.  Eyes: EOM are normal. Pupils are equal, round, and reactive to light.  Neck: Neck supple. No tracheal deviation present.  Cardiovascular: Normal rate.   Pulmonary/Chest: Effort normal. No respiratory distress. She exhibits no tenderness.   Musculoskeletal: Normal range of motion.  Tender lateral and posterior aspect of R knee Tenderness with ROM R knee is grossly  Neurological: She is alert and oriented to person, place, and time.  R foot is neurovascularly intact distally   Skin: Skin is warm and dry.  Psychiatric: She has a normal mood and affect. Her behavior is normal.    ED Course  Procedures DIAGNOSTIC STUDIES:  COORDINATION OF CARE: 21:51- Evaluated Pt. Pt is awake, alert, and without distress. Pt appears uncomfortable. 21:56- Ordered DG Knee Complete 4 Views Left 1 time. 21:57- Patient understand an agree with initial ED impression and plan with expectations set for ED visit.  Results for orders placed in visit on 09/19/12  PREGNANCY, URINE      Result Value Range   Preg Test, Ur NEG     No results found.    Labs Reviewed - No data to display Dg Knee Complete 4 Views Left  05/19/2013   *RADIOLOGY REPORT*  Clinical Data: Left knee and leg pain post squatting  LEFT KNEE - COMPLETE 4+ VIEW  Comparison: None  Findings: Bone mineralization normal. Joint spaces preserved. No fracture, dislocation, or bone destruction. No joint effusion.  IMPRESSION: No acute osseous abnormalities.   Original Report Authenticated By: Ulyses Southward, M.D.   1. Knee strain, left, initial encounter     MDM  Patient with left knee pain laterally. Acute onset. Moderate tenderness. Neurovascular intact distally with palpable pulse. Negative x-ray. Patient does not want a knee immobilizer. Will discharge home with pain medicines, crutches and ortho followup.   I personally performed the services described in this documentation, which was scribed in my presence. The recorded information has been reviewed and is accurate.     Juliet Rude. Rubin Payor, MD 05/19/13 [phone]

## 2013-05-19 NOTE — ED Notes (Signed)
Pt given crutches and demonstration. Pt returned crutch walking demonstration. No needs voiced at this time.

## 2013-09-30 ENCOUNTER — Encounter: Payer: Self-pay | Admitting: *Deleted

## 2013-10-02 ENCOUNTER — Ambulatory Visit (INDEPENDENT_AMBULATORY_CARE_PROVIDER_SITE_OTHER): Payer: Managed Care, Other (non HMO) | Admitting: Nurse Practitioner

## 2013-10-02 ENCOUNTER — Encounter: Payer: Self-pay | Admitting: Nurse Practitioner

## 2013-10-02 VITALS — BP 128/70 | Temp 98.6°F | Ht 66.0 in | Wt 236.0 lb

## 2013-10-02 DIAGNOSIS — E039 Hypothyroidism, unspecified: Secondary | ICD-10-CM

## 2013-10-02 DIAGNOSIS — R5381 Other malaise: Secondary | ICD-10-CM

## 2013-10-02 DIAGNOSIS — Z0000 Encounter for general adult medical examination without abnormal findings: Secondary | ICD-10-CM

## 2013-10-02 DIAGNOSIS — N920 Excessive and frequent menstruation with regular cycle: Secondary | ICD-10-CM

## 2013-10-02 DIAGNOSIS — E282 Polycystic ovarian syndrome: Secondary | ICD-10-CM

## 2013-10-02 DIAGNOSIS — J309 Allergic rhinitis, unspecified: Secondary | ICD-10-CM

## 2013-10-02 MED ORDER — CEFUROXIME AXETIL 500 MG PO TABS: 500.0000 mg | ORAL_TABLET | Freq: Two times a day (BID) | ORAL | Status: DC

## 2013-10-02 NOTE — Patient Instructions (Signed)
Nasacort AQ as directed Loratadine 10 mg in the morning Benadryl 25 mg at night Saline nasal spray

## 2013-10-03 LAB — LIPID PANEL
HDL: 77 mg/dL (ref >39)
LDL Cholesterol: 151 mg/dL — ABNORMAL HIGH (ref 0–99)
Total CHOL/HDL Ratio: 3.5 Ratio
Triglycerides: 223 mg/dL — ABNORMAL HIGH (ref <150)
VLDL: 45 mg/dL — ABNORMAL HIGH (ref 0–40)

## 2013-10-03 LAB — CBC WITH DIFFERENTIAL/PLATELET
Basophils Absolute: 0 10*3/uL (ref 0.0–0.1)
HCT: 36.8 % (ref 36.0–46.0)
Lymphocytes Relative: 32 % (ref 12–46)
Monocytes Absolute: 0.4 10*3/uL (ref 0.1–1.0)
Neutro Abs: 1.9 10*3/uL (ref 1.7–7.7)
RDW: 15.3 % (ref 11.5–15.5)
WBC: 3.6 10*3/uL — ABNORMAL LOW (ref 4.0–10.5)

## 2013-10-03 LAB — HEPATIC FUNCTION PANEL
ALT: 12 U/L (ref 0–35)
Bilirubin, Direct: 0.1 mg/dL (ref 0.0–0.3)
Indirect Bilirubin: 0.5 mg/dL (ref 0.0–0.9)

## 2013-10-03 LAB — BASIC METABOLIC PANEL
Calcium: 9 mg/dL (ref 8.4–10.5)
Glucose, Bld: 104 mg/dL — ABNORMAL HIGH (ref 70–99)
Sodium: 136 mEq/L (ref 135–145)

## 2013-10-05 ENCOUNTER — Encounter: Payer: Self-pay | Admitting: Nurse Practitioner

## 2013-10-05 NOTE — Progress Notes (Signed)
Subjective:  Presents for complaints of a flareup of her head congestion. Was seen in urgent care about 3 weeks ago. Given Levaquin and prednisone. Had bad cramping with Levaquin, stopped after one week. Symptoms were slightly better but came back. Now having frontal area headache. Extreme nasal itching. Using a coolmist humidifier. No fever. Increased cough producing clear mucus. No sore throat. No ear pain. Also requesting routine lab work.  Objective:   BP 128/70  Temp(Src) 98.6 F (37 C) (Oral)  Ht 5\' 6"  (1.676 m)  Wt 236 lb (107.049 kg)  BMI 38.11 kg/m2 NAD. Alert, oriented. TMs clear effusion, no erythema. Pharynx injected with PND noted. Neck supple with mild soft nontender adenopathy. Lungs clear. Heart regular rate rhythm.  Assessment:Allergic rhinitis  Routine general medical examination at a health care facility - Plan: Basic metabolic panel, Lipid panel  Other malaise and fatigue - Plan: Basic metabolic panel, CBC with Differential, Hepatic function panel, Vit D  25 hydroxy (rtn osteoporosis monitoring)  Menorrhagia - Plan: CBC with Differential  Hypothyroidism - Plan: TSH  PCOS (polycystic ovarian syndrome) - Plan: Testosterone, Insulin, fasting  Plan: Meds ordered this encounter  Medications  . DISCONTD: HYDROMET 5-1.5 MG/5ML syrup    Sig:   . cefUROXime (CEFTIN) 500 MG tablet    Sig: Take 1 tablet (500 mg total) by mouth 2 (two) times daily with a meal.    Dispense:  20 tablet    Refill:  0    Order Specific Question:  Supervising Provider    Answer:  Merlyn Albert [[id number]]  Nasacort AQ as directed Loratadine 10 mg in the morning Benadryl 25 mg at night Saline nasal spray Take antihistamines especially for nasal itching. Stop Benadryl once this has improved. Call back if symptoms worsen or persist.

## 2013-10-18 ENCOUNTER — Ambulatory Visit (INDEPENDENT_AMBULATORY_CARE_PROVIDER_SITE_OTHER): Payer: Managed Care, Other (non HMO) | Admitting: Nurse Practitioner

## 2013-10-18 ENCOUNTER — Encounter: Payer: Self-pay | Admitting: Nurse Practitioner

## 2013-10-18 VITALS — BP 130/80 | Ht 66.0 in | Wt 239.0 lb

## 2013-10-18 DIAGNOSIS — R7301 Impaired fasting glucose: Secondary | ICD-10-CM

## 2013-10-18 DIAGNOSIS — E282 Polycystic ovarian syndrome: Secondary | ICD-10-CM

## 2013-10-18 DIAGNOSIS — E161 Other hypoglycemia: Secondary | ICD-10-CM

## 2013-10-18 DIAGNOSIS — E559 Vitamin D deficiency, unspecified: Secondary | ICD-10-CM

## 2013-10-18 DIAGNOSIS — E039 Hypothyroidism, unspecified: Secondary | ICD-10-CM

## 2013-10-18 MED ORDER — METFORMIN HCL 500 MG PO TABS: 500.0000 mg | ORAL_TABLET | Freq: Two times a day (BID) | ORAL | Status: DC

## 2013-10-18 NOTE — Patient Instructions (Signed)
Vitamin D 1000 units per day Start Metformin 500 mg with supper for at least 2 weeks then if tolerated increase to twice a day with meals

## 2013-10-18 NOTE — Assessment & Plan Note (Signed)
Discussed at length the importance of regular activity and avoiding simple carbs/sugars. Discussed multiple programs or Consultations available to help with weight loss. Recommend weight loss of 24 pounds or 10% of her body weight. Start metformin 1 at bedtime with supper and if tolerated increase to one twice a day with meals. Vitamin D 1000 units per day. Patient to call back if we can assist with any referrals for weight. Discussed risk of developing type 2 diabetes. Recheck in 6 months, sooner if any problems.

## 2013-10-18 NOTE — Assessment & Plan Note (Signed)
Discussed at length the importance of regular activity and avoiding simple carbs/sugars. Discussed multiple programs or Consultations available to help with weight loss. Recommend weight loss of 24 pounds or 10% of her body weight. Start metformin 1 at bedtime with supper and if tolerated increase to one twice a day with meals. Vitamin D 1000 units per day. Patient to call back if we can assist with any referrals for weight. Discussed risk of developing type 2 diabetes. Recheck in 6 months, sooner if any problems. 

## 2013-10-18 NOTE — Progress Notes (Signed)
Subjective:  Presents for followup. Has been diagnosed with PCOS by her gynecologist. Has had great difficulty losing weight. Even with regular activity and watch her diet the lowest her weight is been is around 170. Her blood sugars at home have run less than 100. Does have extreme fatigue at times. Also occasional hypoglycemia.  Objective:   BP 130/80  Ht 5\' 6"  (1.676 m)  Wt 239 lb (108.41 kg)  BMI 38.59 kg/m2 Reviewed lab work with patient dated 10/02/2013. FBS at that time 104, today 99. Has elevated fasting insulin level and slightly low vitamin D level. Significant central obesity noted. Large waist circumference.  Assessment: Impaired fasting glucose - Plan: POCT glucose (manual entry)  PCOS (polycystic ovarian syndrome)  Hypothyroidism  Morbid obesity  Hyperinsulinemia  Vitamin D insufficiency  Plan: Meds ordered this encounter  Medications  . metFORMIN (GLUCOPHAGE) 500 MG tablet    Sig: Take 1 tablet (500 mg total) by mouth 2 (two) times daily with a meal.    Dispense:  60 tablet    Refill:  2    Order Specific Question:  Supervising Provider    Answer:  Merlyn Albert [[id number]]   Discussed at length the importance of regular activity and avoiding simple carbs/sugars. Discussed multiple programs or Consultations available to help with weight loss. Recommend weight loss of 24 pounds or 10% of her body weight. Start metformin 1 at bedtime with supper and if tolerated increase to one twice a day with meals. Vitamin D 1000 units per day. Patient to call back if we can assist with any referrals for weight. Discussed risk of developing type 2 diabetes. Recheck in 6 months, sooner if any problems.

## 2013-11-14 ENCOUNTER — Ambulatory Visit (INDEPENDENT_AMBULATORY_CARE_PROVIDER_SITE_OTHER): Payer: Managed Care, Other (non HMO) | Admitting: Podiatry

## 2013-11-14 ENCOUNTER — Ambulatory Visit (INDEPENDENT_AMBULATORY_CARE_PROVIDER_SITE_OTHER): Payer: Managed Care, Other (non HMO)

## 2013-11-14 ENCOUNTER — Encounter: Payer: Self-pay | Admitting: Podiatry

## 2013-11-14 VITALS — BP 112/96 | HR 93 | Resp 16 | Ht 66.0 in | Wt 230.0 lb

## 2013-11-14 DIAGNOSIS — M79672 Pain in left foot: Secondary | ICD-10-CM

## 2013-11-14 DIAGNOSIS — M722 Plantar fascial fibromatosis: Secondary | ICD-10-CM

## 2013-11-14 DIAGNOSIS — M7750 Other enthesopathy of unspecified foot: Secondary | ICD-10-CM

## 2013-11-14 DIAGNOSIS — M79609 Pain in unspecified limb: Secondary | ICD-10-CM

## 2013-11-14 DIAGNOSIS — L84 Corns and callosities: Secondary | ICD-10-CM

## 2013-11-14 MED ORDER — TRIAMCINOLONE ACETONIDE 10 MG/ML IJ SUSP: 10.0000 mg | Freq: Once | INTRAMUSCULAR | Status: DC

## 2013-11-14 NOTE — Patient Instructions (Signed)

## 2013-11-14 NOTE — Progress Notes (Signed)
   Subjective:    Patient ID: Bonnie [name], female    DOB: 06-16-1971, 42 y.o.   MRN: [id number]  HPI Comments: N pain L left  Heel , dorsal and lateral D 1 -2 m O sudden C worse A am bad, after sitting T went to urgent care- x-rays, advil, wrap ace wrap  Foot Pain      Review of Systems  Constitutional: Negative.   HENT: Positive for sneezing.   Eyes: Negative.   Respiratory: Negative.   Cardiovascular: Negative.   Gastrointestinal: Negative.   Endocrine: Negative.   Genitourinary: Negative.   Musculoskeletal:       Difficulty walking  Skin: Negative.   Allergic/Immunologic: Negative.   Neurological: Negative.   Hematological: Negative.   Psychiatric/Behavioral: Negative.        Objective:   Physical Exam        Assessment & Plan:

## 2013-11-14 NOTE — Progress Notes (Signed)
Subjective:     Patient ID: Bonnie [name], female   DOB: July 11, 1971, 42 y.o.   MRN: [id number]  Foot Pain   patient presents stating I have been having all full pain in my left heel for several months and I am now getting pain on the outside of my foot that is making walking extremely difficult. Does not remember specific injury only a worsening of pain over this last several months   Review of Systems  All other systems reviewed and are negative.       Objective:   Physical Exam  Nursing note and vitals reviewed. Constitutional: She is oriented to person, place, and time.  Cardiovascular: Intact distal pulses.   Musculoskeletal: Normal range of motion.  Neurological: She is oriented to person, place, and time.  Skin: Skin is warm.   neurovascular status intact with normal range of motion and no muscle strength loss. Mild equinus condition noted both feet and I noted intense discomfort left plantar fascia and at the lateral side the left foot with in the peroneal group     Assessment:     Plantar fasciitis left heel with inflammation and probable compensation tendinitis left lateral foot that is intense in its own way    Plan:     H&P performed and x-ray reviewed. Injected the left plantar fascia 3 mg Kenalog 5 mg Xylocaine Marcaine mixture and injected separately the lateral tendon complex 3 mg Kenalog 5 mg Xylocaine Marcaine mixture. Dispensed fascially brace with instructions on usage

## 2013-11-21 ENCOUNTER — Ambulatory Visit (INDEPENDENT_AMBULATORY_CARE_PROVIDER_SITE_OTHER): Payer: BC Managed Care – PPO | Admitting: Podiatry

## 2013-11-21 ENCOUNTER — Encounter: Payer: Self-pay | Admitting: Podiatry

## 2013-11-21 VITALS — BP 149/95 | HR 89 | Resp 16

## 2013-11-21 DIAGNOSIS — M7750 Other enthesopathy of unspecified foot: Secondary | ICD-10-CM

## 2013-11-21 DIAGNOSIS — M722 Plantar fascial fibromatosis: Secondary | ICD-10-CM

## 2013-11-21 MED ORDER — TRIAMCINOLONE ACETONIDE 10 MG/ML IJ SUSP
10.0000 mg | Freq: Once | INTRAMUSCULAR | Status: AC
  Administered 2013-11-21: 10 mg

## 2013-11-21 NOTE — Progress Notes (Signed)
   Subjective:    Patient ID: Bonnie [name], female    DOB: 02-19-1971, 43 y.o.   MRN: [id number]  HPI Comments: Follow up plantar fasciitis left heel, still hurts around the sides but it is much much better      Review of Systems     Objective:   Physical Exam        Assessment & Plan:

## 2013-11-21 NOTE — Progress Notes (Signed)
Subjective:     Patient ID: Bonnie [name], female   DOB: 10/23/1971, 43 y.o.   MRN: [id number]  HPI patient states that she is doing much better and her heel pain and outside pain seems to be improved but there still is pain with ambulation and after sitting   Review of Systems     Objective:   Physical Exam Neurovascular status intact no health history changes noted with pain still noted plantar   heel and mild discomfort on the lateral foot noted Assessment:     Plantar fasciitis still noted left heel and tendinitis left    Plan:     Reviewed conditions and reinjected the plantar fascial left 3 mg Kenalog 5 mg Xylocaine Marcaine mixture and then went ahead and advised on ice physical therapy and scanned for custom orthotic devices. Reappoint when orthotics returned

## 2013-11-23 ENCOUNTER — Ambulatory Visit: Payer: Managed Care, Other (non HMO) | Admitting: Podiatry

## 2013-12-04 ENCOUNTER — Encounter: Payer: Self-pay | Admitting: *Deleted

## 2013-12-04 NOTE — Progress Notes (Signed)
Sent pt post card letting them know orthotics are in.

## 2013-12-29 ENCOUNTER — Telehealth: Payer: Self-pay | Admitting: Family Medicine

## 2013-12-29 ENCOUNTER — Ambulatory Visit: Payer: BC Managed Care – PPO | Admitting: Podiatry

## 2013-12-29 MED ORDER — BUPROPION HCL ER (SR) 150 MG PO TB12: 150.0000 mg | ORAL_TABLET | Freq: Two times a day (BID) | ORAL | Status: DC

## 2013-12-29 NOTE — Telephone Encounter (Signed)
30 day supply sent to pharmacy. Patient was notified. 

## 2013-12-29 NOTE — Telephone Encounter (Signed)
Patient usually gets her Wellbutrin through patient assistance program at [facility] but she ordered it earlier in the week and it still has not come in. They are also closed today. She is hoping we can give her a refill. She needs Rx for Welbutrin SR 150 mg 1 time morning, 1 time afternoon She is completely out.   [facility]

## 2014-01-02 ENCOUNTER — Ambulatory Visit: Payer: BC Managed Care – PPO | Admitting: Podiatry

## 2014-02-07 ENCOUNTER — Telehealth: Payer: Self-pay | Admitting: Family Medicine

## 2014-02-07 ENCOUNTER — Other Ambulatory Visit: Payer: Self-pay | Admitting: Nurse Practitioner

## 2014-02-07 DIAGNOSIS — E039 Hypothyroidism, unspecified: Secondary | ICD-10-CM

## 2014-02-07 DIAGNOSIS — E161 Other hypoglycemia: Secondary | ICD-10-CM

## 2014-02-07 DIAGNOSIS — E282 Polycystic ovarian syndrome: Secondary | ICD-10-CM

## 2014-02-07 NOTE — Telephone Encounter (Signed)
Pt LMOVM for me, wants referral to [facility]owell, [facility].  Was seen early December 2014 for some abnormal labs, pt's thought about it and would like to see specialist.  If "ok" to refer, need Dx and please initiate referral in system so that I may process.  Or does pt NTBS for more labs?  Please advise

## 2014-02-26 ENCOUNTER — Encounter: Payer: Self-pay | Admitting: Family Medicine

## 2014-02-27 NOTE — Telephone Encounter (Signed)
Appointment scheduled, pt aware.

## 2014-03-06 ENCOUNTER — Ambulatory Visit: Payer: BC Managed Care – PPO | Admitting: Podiatry

## 2014-03-16 ENCOUNTER — Ambulatory Visit: Payer: BC Managed Care – PPO | Admitting: Podiatry

## 2014-03-23 ENCOUNTER — Ambulatory Visit (INDEPENDENT_AMBULATORY_CARE_PROVIDER_SITE_OTHER): Payer: BC Managed Care – PPO | Admitting: Podiatry

## 2014-03-23 ENCOUNTER — Ambulatory Visit (INDEPENDENT_AMBULATORY_CARE_PROVIDER_SITE_OTHER): Payer: BC Managed Care – PPO

## 2014-03-23 VITALS — BP 137/92 | HR 96 | Resp 16

## 2014-03-23 DIAGNOSIS — M79609 Pain in unspecified limb: Secondary | ICD-10-CM

## 2014-03-23 DIAGNOSIS — M7750 Other enthesopathy of unspecified foot: Secondary | ICD-10-CM

## 2014-03-23 DIAGNOSIS — M79673 Pain in unspecified foot: Secondary | ICD-10-CM

## 2014-03-23 DIAGNOSIS — M722 Plantar fascial fibromatosis: Secondary | ICD-10-CM

## 2014-03-23 DIAGNOSIS — M2010 Hallux valgus (acquired), unspecified foot: Secondary | ICD-10-CM

## 2014-03-23 MED ORDER — TRIAMCINOLONE ACETONIDE 10 MG/ML IJ SUSP
10.0000 mg | Freq: Once | INTRAMUSCULAR | Status: AC
  Administered 2014-03-23: 10 mg

## 2014-03-23 NOTE — Patient Instructions (Signed)

## 2014-03-25 NOTE — Progress Notes (Signed)
Subjective:     Patient ID: Bonnie [name], female   DOB: 02/11/1971, 43 y.o.   MRN: [id number]  HPI patient states my left heel still hurts at the insertion but improved over where it was previously   Review of Systems     Objective:   Physical Exam Plantar fasciitis still noted left with obesity is complicating    Assessment:     Chronic plantar fasciitis left with inflammation    Plan:     Reinjected the plantar fascia 3 mg Kenalog 50 g like Marcaine mixture dispensed orthotics with instructions on usage and reappoint in 3-4 weeks

## 2014-03-30 DIAGNOSIS — E8881 Metabolic syndrome: Secondary | ICD-10-CM | POA: Insufficient documentation

## 2014-03-30 DIAGNOSIS — E010 Iodine-deficiency related diffuse (endemic) goiter: Secondary | ICD-10-CM | POA: Insufficient documentation

## 2014-04-06 ENCOUNTER — Ambulatory Visit (INDEPENDENT_AMBULATORY_CARE_PROVIDER_SITE_OTHER): Payer: BC Managed Care – PPO | Admitting: Nurse Practitioner

## 2014-04-06 VITALS — BP 122/78 | Ht 66.0 in | Wt 228.0 lb

## 2014-04-06 DIAGNOSIS — E161 Other hypoglycemia: Secondary | ICD-10-CM

## 2014-04-06 DIAGNOSIS — E039 Hypothyroidism, unspecified: Secondary | ICD-10-CM

## 2014-04-09 ENCOUNTER — Encounter: Payer: Self-pay | Admitting: Nurse Practitioner

## 2014-04-09 NOTE — Progress Notes (Signed)
Subjective:  Presents for recheck. Was seen by endocrinologist at [facility] recently. States her TSH was 4.6. Her Levothyroxine was increased to 150 mcg. Labs unavailable during office visit. Her endocrinologist recommends starting med such as Victoza. Has been referred back to our office to discuss. Has cut down her calorie intake. Limited activity. Difficulty losing weight.  Objective:   BP 122/78  Ht 5\' 6"  (1.676 m)  Wt 228 lb (103.42 kg)  BMI 36.82 kg/m2 NAD. Alert, oriented. Lungs clear. Heart RRR. Significant central obesity.   Assessment:  Problem List Items Addressed This Visit     Endocrine   Hyperinsulinemia - Primary   Hypothyroidism   Relevant Medications      levothyroxine (SYNTHROID, LEVOTHROID) 150 MCG tablet     Other   Morbid obesity   Relevant Medications      magnesium oxide (MAG-OX) 400 MG tablet     Plan:  Recommend Tanzeum; given coupon and information. Because this is off label use since patient is not diabetic (according to our labs), patient to contact her specialist about starting medication. Recheck here as needed. Advised patient to eat adequate amount of calories.

## 2014-04-16 ENCOUNTER — Other Ambulatory Visit: Payer: Self-pay | Admitting: Nurse Practitioner

## 2014-04-20 ENCOUNTER — Ambulatory Visit: Payer: BC Managed Care – PPO | Admitting: Podiatry

## 2014-04-23 ENCOUNTER — Other Ambulatory Visit: Payer: Self-pay | Admitting: Nurse Practitioner

## 2014-04-23 MED ORDER — METFORMIN HCL 500 MG PO TABS: ORAL_TABLET | ORAL | Status: DC

## 2014-07-10 ENCOUNTER — Ambulatory Visit: Payer: BC Managed Care – PPO | Admitting: Family Medicine

## 2014-08-29 ENCOUNTER — Other Ambulatory Visit: Payer: Self-pay | Admitting: Obstetrics and Gynecology

## 2014-09-11 ENCOUNTER — Other Ambulatory Visit: Payer: Self-pay | Admitting: Obstetrics and Gynecology

## 2014-10-01 ENCOUNTER — Ambulatory Visit: Payer: BC Managed Care – PPO | Admitting: Nurse Practitioner

## 2014-11-28 ENCOUNTER — Telehealth: Payer: Self-pay | Admitting: Family Medicine

## 2014-11-28 DIAGNOSIS — E039 Hypothyroidism, unspecified: Secondary | ICD-10-CM

## 2014-11-28 NOTE — Telephone Encounter (Signed)
This patient gets her medications through the prescription assistance program with the [place]. We sign off on these prescriptions. This particular prescription is for thyroid medicine. Please let the patient know that the last time she did that TSH was November 2014. Please let her know in order for us to continue to sign the prescriptions that the [place] assistance program does she must go do a TSH. If she does not do the TSH we will not be able to continue to prescribe this medication. Please put in order for TSH. Let the patient know that it is her responsibility for her health to get this done within the next 3 weeks. Failure to do this not only will jeopardize her getting her medications signed off by us but also could undermine her health.

## 2014-11-29 ENCOUNTER — Telehealth: Payer: Self-pay | Admitting: Family Medicine

## 2014-11-29 NOTE — Telephone Encounter (Signed)
Patient states [facility] and [facility] and she will just get him to sign off on her thyroid med.

## 2014-11-29 NOTE — Telephone Encounter (Signed)
Please send the script back to Bonnie [name] with the message that future refills should be via her [facility] doctor ( I signed it for a 90 day script and 1 refill

## 2014-11-29 NOTE — Addendum Note (Signed)
Addended by: Margaretha SheffieldBROWN, AUTUMN S on: 11/29/2014 09:41 AM   Modules accepted: Orders

## 2015-01-21 ENCOUNTER — Ambulatory Visit (INDEPENDENT_AMBULATORY_CARE_PROVIDER_SITE_OTHER): Payer: BLUE CROSS/BLUE SHIELD | Admitting: Nurse Practitioner

## 2015-01-21 ENCOUNTER — Encounter: Payer: Self-pay | Admitting: Nurse Practitioner

## 2015-01-21 VITALS — BP 112/70 | Ht 66.0 in | Wt 220.4 lb

## 2015-01-21 DIAGNOSIS — L509 Urticaria, unspecified: Secondary | ICD-10-CM | POA: Diagnosis not present

## 2015-01-21 DIAGNOSIS — J302 Other seasonal allergic rhinitis: Secondary | ICD-10-CM

## 2015-01-21 DIAGNOSIS — J3089 Other allergic rhinitis: Principal | ICD-10-CM

## 2015-01-21 DIAGNOSIS — J309 Allergic rhinitis, unspecified: Secondary | ICD-10-CM | POA: Diagnosis not present

## 2015-01-21 MED ORDER — METHYLPREDNISOLONE ACETATE 40 MG/ML IJ SUSP
40.0000 mg | Freq: Once | INTRAMUSCULAR | Status: AC
  Administered 2015-01-21: 40 mg via INTRAMUSCULAR

## 2015-01-21 MED ORDER — TRIAMCINOLONE ACETONIDE 0.1 % EX CREA: 1.0000 "application " | TOPICAL_CREAM | Freq: Two times a day (BID) | CUTANEOUS | Status: DC

## 2015-01-21 NOTE — Patient Instructions (Addendum)
nasacort AQ as directed Claritin 10 mg in the morning Benadryl 25 mg at night Zaditor eye drops as directed Zantac OTC as directed

## 2015-01-22 ENCOUNTER — Encounter: Payer: Self-pay | Admitting: Nurse Practitioner

## 2015-01-22 ENCOUNTER — Encounter: Payer: Self-pay | Admitting: Family Medicine

## 2015-01-22 NOTE — Progress Notes (Signed)
Subjective:  Presents for c/o sinus symptoms x 3 d. Cough at night. Sore throat. Sneezing. Sinus pressure. Clear mucus. No fever or ear pain. History of seasonal allergies. Also recurrent itching on both arms x 6 months. Starts with itching; patient will scratch; redness noted which will fade. Only on arms. No known allergens. No known contacts. [facility].  Objective:   BP 112/70 mmHg  Ht 5\' 6"  (1.676 m)  Wt 220 lb 6 oz (99.961 kg)  BMI 35.59 kg/m2 NAD. Alert, oriented. TMs retracted, no erythema. Pharynx clear. Neck supple with mild anterior adenopathy. Lungs clear. Heart RRR. A few scattered slightly raised pink papules only on arms. Hands are clear.   Assessment:  Seasonal and perennial allergic rhinitis - Plan: methylPREDNISolone acetate (DEPO-MEDROL) injection 40 mg  Urticaria  Plan:  Meds ordered this encounter  Medications  . Cyanocobalamin (VITAMIN B-12 CR PO)    Sig: Take by mouth.  . Multiple Vitamin (MULTIVITAMIN) tablet    Sig: Take 1 tablet by mouth daily.  Marland Kitchen. Fe Cbn-Fe Gluc-FA-B12-C-DSS (FERRALET 90) 90-1 MG TABS    Sig: daily.     Refill:  2  . triamcinolone cream (KENALOG) 0.1 %    Sig: Apply 1 application topically 2 (two) times daily. Prn rash; use up to 2 weeks    Dispense:  30 g    Refill:  0    Order Specific Question:  Supervising Provider    Answer:  Merlyn AlbertLUKING, WILLIAM S [[id number]]  . methylPREDNISolone acetate (DEPO-MEDROL) injection 40 mg    Sig:    nasacort AQ as directed Claritin 10 mg in the morning Benadryl 25 mg at night Zaditor eye drops as directed Zantac OTC as directed Call back if worsens or persists.

## 2015-01-24 ENCOUNTER — Encounter: Payer: Self-pay | Admitting: Nurse Practitioner

## 2015-02-06 ENCOUNTER — Other Ambulatory Visit: Payer: Self-pay | Admitting: Nurse Practitioner

## 2015-02-06 MED ORDER — HYDROCODONE-HOMATROPINE 5-1.5 MG/5ML PO SYRP: 5.0000 mL | ORAL_SOLUTION | ORAL | Status: DC | PRN

## 2015-02-06 MED ORDER — BENZONATATE 100 MG PO CAPS: 100.0000 mg | ORAL_CAPSULE | Freq: Three times a day (TID) | ORAL | Status: DC | PRN

## 2015-02-15 ENCOUNTER — Ambulatory Visit: Payer: BLUE CROSS/BLUE SHIELD | Admitting: Nurse Practitioner

## 2015-02-15 ENCOUNTER — Ambulatory Visit (INDEPENDENT_AMBULATORY_CARE_PROVIDER_SITE_OTHER): Payer: BLUE CROSS/BLUE SHIELD | Admitting: Nurse Practitioner

## 2015-02-15 ENCOUNTER — Encounter: Payer: Self-pay | Admitting: Nurse Practitioner

## 2015-02-15 VITALS — BP 128/88 | Temp 98.2°F | Ht 66.0 in | Wt 221.0 lb

## 2015-02-15 DIAGNOSIS — J00 Acute nasopharyngitis [common cold]: Secondary | ICD-10-CM | POA: Diagnosis not present

## 2015-02-15 DIAGNOSIS — J208 Acute bronchitis due to other specified organisms: Secondary | ICD-10-CM

## 2015-02-15 DIAGNOSIS — J329 Chronic sinusitis, unspecified: Secondary | ICD-10-CM | POA: Diagnosis not present

## 2015-02-15 DIAGNOSIS — J310 Chronic rhinitis: Secondary | ICD-10-CM

## 2015-02-15 DIAGNOSIS — B9689 Other specified bacterial agents as the cause of diseases classified elsewhere: Secondary | ICD-10-CM

## 2015-02-15 MED ORDER — HYDROCODONE-HOMATROPINE 5-1.5 MG/5ML PO SYRP: 5.0000 mL | ORAL_SOLUTION | ORAL | Status: DC | PRN

## 2015-02-15 MED ORDER — AZITHROMYCIN 250 MG PO TABS: ORAL_TABLET | ORAL | Status: DC

## 2015-02-15 MED ORDER — PREDNISONE 20 MG PO TABS: ORAL_TABLET | ORAL | Status: DC

## 2015-02-18 ENCOUNTER — Encounter: Payer: Self-pay | Admitting: Nurse Practitioner

## 2015-02-18 NOTE — Progress Notes (Signed)
Subjective:  Presents for c/o cough x one month. No fevers. Facial area headache. Watery runny nose. Spells of cough, worse at night. Non productive. No sore throat or ear pain. Went to Urgent care recently. Prescribed steroid nasal spray and tessalon perles.   Objective:   BP 128/88 mmHg  Temp(Src) 98.2 F (36.8 C) (Oral)  Ht 5\' 6"  (1.676 m)  Wt 221 lb (100.245 kg)  BMI 35.69 kg/m2 NAD. Alert, oriented. TMs clear effusion. Pharynx injected with PND noted. Neck supple with mild anterior adenopathy. Lungs: scattered expiratory crackles posterior, anterior clear. Occasional non productive cough noted. No wheezing or tachypnea. Heart RRR.   Assessment: Rhinosinusitis  Acute bacterial bronchitis  Plan:  Meds ordered this encounter  Medications  . flunisolide (NASALIDE) 25 MCG/ACT (0.025%) SOLN    Sig:     Refill:  0  . DISCONTD: benzonatate (TESSALON) 200 MG capsule    Sig:     Refill:  0  . Liraglutide -Weight Management 18 MG/3ML SOPN    Sig: Inject into the skin.  Marland Kitchen. VIRTUSSIN A/C 100-10 MG/5ML syrup    Sig:     Refill:  0  . azithromycin (ZITHROMAX Z-PAK) 250 MG tablet    Sig: Take 2 tablets (500 mg) on  Day 1,  followed by 1 tablet (250 mg) once daily on Days 2 through 5.    Dispense:  6 each    Refill:  0    Order Specific Question:  Supervising Provider    Answer:  Merlyn AlbertLUKING, WILLIAM S [[id number]]  . predniSONE (DELTASONE) 20 MG tablet    Sig: 3 po qd x 3 d then 2 po qd x 3 d then 1 po qd x 3 d    Dispense:  18 tablet    Refill:  0    Order Specific Question:  Supervising Provider    Answer:  Merlyn AlbertLUKING, WILLIAM S [[id number]]  . HYDROcodone-homatropine (HYCODAN) 5-1.5 MG/5ML syrup    Sig: Take 5 mLs by mouth every 4 (four) hours as needed.    Dispense:  120 mL    Refill:  0    Order Specific Question:  Supervising Provider    Answer:  Merlyn AlbertLUKING, WILLIAM S [[id number]]   Call back in 7-10 days if no improvement, sooner if worse.

## 2015-04-04 ENCOUNTER — Other Ambulatory Visit: Payer: Self-pay | Admitting: Family Medicine

## 2015-06-11 ENCOUNTER — Other Ambulatory Visit: Payer: Self-pay | Admitting: Obstetrics and Gynecology

## 2015-06-12 LAB — CYTOLOGY - PAP

## 2015-06-13 ENCOUNTER — Other Ambulatory Visit: Payer: Self-pay | Admitting: Obstetrics and Gynecology

## 2015-06-13 DIAGNOSIS — R928 Other abnormal and inconclusive findings on diagnostic imaging of breast: Secondary | ICD-10-CM

## 2015-06-19 ENCOUNTER — Other Ambulatory Visit: Payer: Self-pay | Admitting: Obstetrics and Gynecology

## 2015-06-19 ENCOUNTER — Ambulatory Visit
Admission: RE | Admit: 2015-06-19 | Discharge: 2015-06-19 | Disposition: A | Discharge status: Home / Self Care | Payer: BLUE CROSS/BLUE SHIELD | Source: Ambulatory Visit | Attending: Obstetrics and Gynecology | Admitting: Obstetrics and Gynecology

## 2015-06-19 DIAGNOSIS — R928 Other abnormal and inconclusive findings on diagnostic imaging of breast: Secondary | ICD-10-CM

## 2015-06-24 ENCOUNTER — Other Ambulatory Visit: Payer: Self-pay | Admitting: Obstetrics and Gynecology

## 2015-06-24 ENCOUNTER — Ambulatory Visit
Admission: RE | Admit: 2015-06-24 | Discharge: 2015-06-24 | Disposition: A | Discharge status: Home / Self Care | Payer: BLUE CROSS/BLUE SHIELD | Source: Ambulatory Visit | Attending: Obstetrics and Gynecology | Admitting: Obstetrics and Gynecology

## 2015-06-24 DIAGNOSIS — R928 Other abnormal and inconclusive findings on diagnostic imaging of breast: Secondary | ICD-10-CM

## 2015-06-24 DIAGNOSIS — N6310 Unspecified lump in the right breast, unspecified quadrant: Secondary | ICD-10-CM

## 2015-06-25 ENCOUNTER — Encounter: Payer: Self-pay | Admitting: Nurse Practitioner

## 2015-06-26 NOTE — Telephone Encounter (Signed)
Just same day appointments

## 2015-08-14 ENCOUNTER — Encounter: Payer: Self-pay | Admitting: Nurse Practitioner

## 2015-09-03 ENCOUNTER — Encounter: Payer: Self-pay | Admitting: Nurse Practitioner

## 2015-09-09 ENCOUNTER — Encounter: Payer: Self-pay | Admitting: Nurse Practitioner

## 2015-09-12 DIAGNOSIS — L298 Other pruritus: Secondary | ICD-10-CM | POA: Insufficient documentation

## 2015-09-15 ENCOUNTER — Encounter: Payer: Self-pay | Admitting: Nurse Practitioner

## 2015-09-23 ENCOUNTER — Encounter: Payer: Self-pay | Admitting: Nurse Practitioner

## 2015-09-30 ENCOUNTER — Encounter: Payer: Self-pay | Admitting: Nurse Practitioner

## 2015-10-03 NOTE — Telephone Encounter (Signed)
Spoke with mother and offer her appt today 10/03/15 with Dr Lorin PicketScott for Bonnie [name] but she declined and scheduled Office visit with you 10/07/15. Mother states she is not displaying any signs of anemia.

## 2015-10-17 HISTORY — PX: CARPAL TUNNEL RELEASE: SHX101

## 2015-10-31 ENCOUNTER — Encounter: Payer: Self-pay | Admitting: Nurse Practitioner

## 2015-11-01 ENCOUNTER — Ambulatory Visit (INDEPENDENT_AMBULATORY_CARE_PROVIDER_SITE_OTHER): Payer: BLUE CROSS/BLUE SHIELD | Admitting: Nurse Practitioner

## 2015-11-01 ENCOUNTER — Encounter: Payer: Self-pay | Admitting: Nurse Practitioner

## 2015-11-01 VITALS — BP 112/82 | Ht 66.0 in | Wt 221.0 lb

## 2015-11-01 DIAGNOSIS — M65851 Other synovitis and tenosynovitis, right thigh: Secondary | ICD-10-CM | POA: Diagnosis not present

## 2015-11-01 DIAGNOSIS — M76891 Other specified enthesopathies of right lower limb, excluding foot: Secondary | ICD-10-CM

## 2015-11-01 MED ORDER — PREDNISONE 20 MG PO TABS: ORAL_TABLET | ORAL | Status: DC

## 2015-11-05 ENCOUNTER — Encounter: Payer: Self-pay | Admitting: Nurse Practitioner

## 2015-11-05 NOTE — Progress Notes (Signed)
Subjective:  Presents for c/o right hip pain x 2 days. Began after walking up some stairs but no specific history of injury. Some stiffness localized to right hip. Has improved slightly.   Objective:   BP 112/82 mmHg  Ht 5\' 6"  (1.676 m)  Wt 221 lb (100.245 kg)  BMI 35.69 kg/m2 NAD. Alert, oriented. Lungs clear. Heart RRR. Tenderness with palpation of the right hip area. SLR neg bilat. Tenderness with ROM of the hip. Difficulty getting on and off exam table. Gait steady.  Assessment: Hip tendonitis, right  Plan:  Meds ordered this encounter  Medications  . predniSONE (DELTASONE) 20 MG tablet    Sig: 3 po qd x 3 d then 2 po qd x 3 d then 1 po qd x 3 d    Dispense:  18 tablet    Refill:  0    Order Specific Question:  Supervising Provider    Answer:  Merlyn AlbertLUKING, WILLIAM S [[id number]]   Continue heat applications and OTC meds. Stretching exercises. Call back next week if no better, sooner if worse.

## 2015-11-07 ENCOUNTER — Encounter: Payer: Self-pay | Admitting: Nurse Practitioner

## 2015-11-08 ENCOUNTER — Other Ambulatory Visit: Payer: Self-pay | Admitting: Nurse Practitioner

## 2015-11-08 MED ORDER — HYDROCODONE-ACETAMINOPHEN 5-325 MG PO TABS: 1.0000 | ORAL_TABLET | ORAL | Status: DC | PRN

## 2015-11-13 ENCOUNTER — Other Ambulatory Visit: Payer: Self-pay | Admitting: Nurse Practitioner

## 2015-11-13 MED ORDER — HYDROCODONE-ACETAMINOPHEN 5-325 MG PO TABS: 1.0000 | ORAL_TABLET | ORAL | Status: DC | PRN

## 2015-12-30 ENCOUNTER — Encounter: Payer: Self-pay | Admitting: Podiatry

## 2015-12-30 ENCOUNTER — Ambulatory Visit (INDEPENDENT_AMBULATORY_CARE_PROVIDER_SITE_OTHER): Payer: BLUE CROSS/BLUE SHIELD | Admitting: Podiatry

## 2015-12-30 ENCOUNTER — Ambulatory Visit (INDEPENDENT_AMBULATORY_CARE_PROVIDER_SITE_OTHER): Payer: BLUE CROSS/BLUE SHIELD

## 2015-12-30 ENCOUNTER — Ambulatory Visit: Payer: Self-pay

## 2015-12-30 VITALS — BP 131/87 | HR 95 | Resp 16

## 2015-12-30 DIAGNOSIS — M779 Enthesopathy, unspecified: Secondary | ICD-10-CM

## 2015-12-30 DIAGNOSIS — M79671 Pain in right foot: Secondary | ICD-10-CM

## 2015-12-30 DIAGNOSIS — M2041 Other hammer toe(s) (acquired), right foot: Secondary | ICD-10-CM

## 2015-12-30 MED ORDER — DICLOFENAC SODIUM 75 MG PO TBEC: 75.0000 mg | DELAYED_RELEASE_TABLET | Freq: Two times a day (BID) | ORAL | Status: DC

## 2015-12-30 MED ORDER — TRIAMCINOLONE ACETONIDE 10 MG/ML IJ SUSP
10.0000 mg | Freq: Once | INTRAMUSCULAR | Status: AC
  Administered 2015-12-30: 10 mg

## 2015-12-30 NOTE — Progress Notes (Signed)
Subjective:     Patient ID: Bonnie [name], female   DOB: 1970/11/26, 45 y.o.   MRN: [id number]  HPI patient states I'm having a lot of pain in this right foot for the last several months and I don't remember a specific injury   Review of Systems     Objective:   Physical Exam  neurovascular status intact muscle strength adequate with inflammation and pain second MPJ right with movement of the second toe in a dorsal medial direction with fluid buildup noted around the joint surface    Assessment:      inflammatory capsulitis second MPJ right with possibility for flexor plate stretch or tear    Plan:      H&P and x-rays of both feet reviewed. At this point I did a proximal nerve block and explained risk of injection of the joint and went ahead and aspirated the second MPJ getting out of small amount of clear fluid and injected with a quarter cc dexamethasone Kenalog and applied thick plantar pad to reduce pressure on the joint surface. Reappoint 2 weeks to reevaluate   X-ray report of both feet indicated there is dorsal and medial dislocation second digit right consistent with probable flexor plate injury. No other pathology was noted and patient is also placed on diclofenac 75 mg twice a day

## 2016-01-01 ENCOUNTER — Telehealth: Payer: Self-pay | Admitting: *Deleted

## 2016-01-01 MED ORDER — ACETAMINOPHEN-CODEINE #3 300-30 MG PO TABS: ORAL_TABLET | ORAL | Status: DC

## 2016-01-01 NOTE — Telephone Encounter (Addendum)
Pt states saw Dr. Charlsie Merles 12/30/2015 and he drew fluid off her foot and gave an injection, pt states the area is still painful.  I encouraged pt to go into a stiff bottom shoe to decrease the bending and continuing to injure the site, ice for 15 minutes 3-4 times a day to decrease swelling, inflammation and pain, rest to decrease chances of continued injury and wear offloading pads as directed by Dr.Regal and take the Voltaren. I offered pt a stiff bottom surgical shoe if she didn't have one. Pt states she gets terrible heartburn from the Voltaren and heart races.  Pt states she will come by after lunch for the surgical shoe.  DR. Charlsie Merles STATES IF VOLTAREN gives heartburn, and causes racing heart, stop the medication and treat symptoms with rest/offloading with pads and darco shoe, ice.  PT PRESENTS FOR [facility] SHOE fitting. I fitted pt with a small darco and pt states after wearing momentarily, that it made the pain in the 2nd MPJ worse.  Pt is instructed not to take the Voltaren and or other antiinflammatory medications,because they may cause the same symptoms.  Pt asked for a pain medication. Dr. Charlsie Merles ordered Tylenol #3 #30 1 tablet every 4-6 hours prn foot pain.  Encouraged pt to continue with rest, ice and offloading pads.

## 2016-01-13 ENCOUNTER — Encounter: Payer: Self-pay | Admitting: Podiatry

## 2016-01-13 ENCOUNTER — Ambulatory Visit (INDEPENDENT_AMBULATORY_CARE_PROVIDER_SITE_OTHER): Payer: BLUE CROSS/BLUE SHIELD | Admitting: Podiatry

## 2016-01-13 VITALS — BP 128/90 | HR 95 | Resp 16

## 2016-01-13 DIAGNOSIS — M779 Enthesopathy, unspecified: Secondary | ICD-10-CM | POA: Diagnosis not present

## 2016-01-13 DIAGNOSIS — M2041 Other hammer toe(s) (acquired), right foot: Secondary | ICD-10-CM

## 2016-01-13 MED ORDER — ACETAMINOPHEN-CODEINE #3 300-30 MG PO TABS: 1.0000 | ORAL_TABLET | ORAL | Status: DC | PRN

## 2016-01-13 NOTE — Patient Instructions (Signed)
Pre-Operative Instructions  Congratulations, you have decided to take an important step to improving your quality of life.  You can be assured that the doctors of [facility] will be with you every step of the way.  1. Plan to be at the surgery center/hospital at least 1 (one) hour prior to your scheduled time unless otherwise directed by the surgical center/hospital staff.  You must have a responsible adult accompany you, remain during the surgery and drive you home.  Make sure you have directions to the surgical center/hospital and know how to get there on time. 2. For hospital based surgery you will need to obtain a history and physical form from your family physician within 1 month prior to the date of surgery- we will give you a form for you primary physician.  3. We make every effort to accommodate the date you request for surgery.  There are however, times where surgery dates or times have to be moved.  We will contact you as soon as possible if a change in schedule is required.   4. No Aspirin/Ibuprofen for one week before surgery.  If you are on aspirin, any non-steroidal anti-inflammatory medications (Mobic, Aleve, Ibuprofen) you should stop taking it 7 days prior to your surgery.  You make take Tylenol  For pain prior to surgery.  5. Medications- If you are taking daily heart and blood pressure medications, seizure, reflux, allergy, asthma, anxiety, pain or diabetes medications, make sure the surgery center/hospital is aware before the day of surgery so they may notify you which medications to take or avoid the day of surgery. 6. No food or drink after midnight the night before surgery unless directed otherwise by surgical center/hospital staff. 7. No alcoholic beverages 24 hours prior to surgery.  No smoking 24 hours prior to or 24 hours after surgery. 8. Wear loose pants or shorts- loose enough to fit over bandages, boots, and casts. 9. No slip on shoes, sneakers are best. 10. Bring  your boot with you to the surgery center/hospital.  Also bring crutches or a walker if your physician has prescribed it for you.  If you do not have this equipment, it will be provided for you after surgery. 11. If you have not been contracted by the surgery center/hospital by the day before your surgery, call to confirm the date and time of your surgery. 12. Leave-time from work may vary depending on the type of surgery you have.  Appropriate arrangements should be made prior to surgery with your employer. 13. Prescriptions will be provided immediately following surgery by your doctor.  Have these filled as soon as possible after surgery and take the medication as directed. 14. Remove nail polish on the operative foot. 15. Wash the night before surgery.  The night before surgery wash the foot and leg well with the antibacterial soap provided and water paying special attention to beneath the toenails and in between the toes.  Rinse thoroughly with water and dry well with a towel.  Perform this wash unless told not to do so by your physician.  Enclosed: 1 Ice pack (please put in freezer the night before surgery)   1 Hibiclens skin cleaner   Pre-op Instructions  If you have any questions regarding the instructions, do not hesitate to call our office.  [place]: [address] [place], [place] [address] [phone]  [place]: [address] , [place] [address] [phone]  [place]: [address]  [place], [place] [address] [phone]  Dr. Richard   Tuchman DPM, Dr. Norman Regal DPM Dr. Richard Sikora DPM, Dr. M. Todd Hyatt DPM, Dr. Kathryn Egerton DPM 

## 2016-01-14 ENCOUNTER — Encounter: Payer: Self-pay | Admitting: Nurse Practitioner

## 2016-01-14 NOTE — Progress Notes (Signed)
Subjective:     Patient ID: Bonnie [name], female   DOB: Feb 03, 1971, 45 y.o.   MRN: [id number]  HPI patient states that this is killing me and I'm probably give need to have something done as it's not gotten better   Review of Systems     Objective:   Physical Exam Neurovascular status intact muscle strength adequate inflammatory changes second MPJ right with fluid buildup around the joint with medial movement of the second toe but most of the problem at the metatarsophalangeal joint    Assessment:     Inflammatory capsulitis second MPJ right that is very painful when pressed with probable flexor plate stretch with mild movement of the toe    Plan:     H&P and x-rays condition reviewed with patient. Due to long-term nature and severe pain and failure to respond to conservative care I have recommended utilization of osteotomy with possible digital stabilization procedure. Patient wants this done and I allowed her to read consent form reviewing alternative treatments complications associated with problem. Patient signed consent form after extensive review and is scheduled for outpatient surgery understanding total recovery can take upwards of 6 months and there is no long-term guarantees as far as success

## 2016-01-20 ENCOUNTER — Telehealth: Payer: Self-pay | Admitting: *Deleted

## 2016-01-20 MED ORDER — TRAMADOL HCL 50 MG PO TABS: 50.0000 mg | ORAL_TABLET | Freq: Three times a day (TID) | ORAL | Status: DC | PRN

## 2016-01-20 NOTE — Telephone Encounter (Addendum)
Pt sent email to scheduler D. Miner, requesting stronger and longer lasting pain medication to use until she was able to return to our office and schedule surgery.  Dr. Charlsie Merlesegal ordered Tramadol 50mg  #30 1 tablet every 8 hours prn foot pain.  Pt states she is driving in the boot.  I told pt to drive in a stiff sandal, because it illegal to drive in [place] with a cast/brace or surgical shoe and dangerous.  Pt states understanding.  01/21/2016-LEFT MESSAGE APOLOGIZING that she had a untoward reaction to the Tramadol, that it is a good pain relieving medication, but like other medications may not work for everyone.  I encouraged pt to call with concerns or if there was anything else I could do.  I place Tramadol in pt's allergy section as a unable to tolerate.  01/21/2016-PT CALLED STATES SHE WOULD LIKE a stronger medication to get her to the surgery date.  Dr. Charlsie Merlesegal ordered hydrocodone 10/325 #30 one tablet every 8 hours prn foot pain.  Informed pt she would have to pick up in the [place] office to take to pharmacy.  Pt states understanding.  02/04/2016-Pt states she is still in a lot of pain and would like refill of the last pain medication, and would like to set up surgery, she has set up help for her recovery.  Dr. Charlsie Merlesegal ordered refill Hydrocodone 10/325 as previously. I transferred pt to D. [name] to schedule surgery.

## 2016-01-21 MED ORDER — HYDROCODONE-ACETAMINOPHEN 10-325 MG PO TABS: 1.0000 | ORAL_TABLET | Freq: Three times a day (TID) | ORAL | Status: DC | PRN

## 2016-02-04 ENCOUNTER — Telehealth: Payer: Self-pay | Admitting: *Deleted

## 2016-02-04 MED ORDER — HYDROCODONE-ACETAMINOPHEN 10-325 MG PO TABS: 1.0000 | ORAL_TABLET | Freq: Three times a day (TID) | ORAL | Status: DC | PRN

## 2016-02-04 NOTE — Telephone Encounter (Signed)
"  I need to schedule my surgery with Dr. Charlsie Merlesegal.  I can't do it too soon because I have to make arrangements for my handicap child."  When would you like to schedule?  "I'd like to do it on April 4."  That date is fine.  I'll get it scheduled.  "Do I need to do anything else?"  You need to register with the surgical center.  You can do it online or you can call, instructions are in your brochure from the surgical center in your blue bag.  Someone from the surgical center will call you with the arrival time either the Friday or Monday before.

## 2016-02-09 ENCOUNTER — Encounter: Payer: Self-pay | Admitting: Nurse Practitioner

## 2016-02-17 ENCOUNTER — Telehealth: Payer: Self-pay | Admitting: *Deleted

## 2016-02-17 NOTE — Telephone Encounter (Signed)
When is she going to reschedule?

## 2016-02-17 NOTE — Telephone Encounter (Signed)
"  I'm calling to let you know that Bonnie [name] called and canceled surgery for tomorrow."  Did she say why?  "She said she has a handicap kid and can't find anyone to take care of him."  I'll let Dr. Charlsie Merlesegal know.

## 2016-02-20 ENCOUNTER — Other Ambulatory Visit: Payer: Self-pay | Admitting: *Deleted

## 2016-02-20 ENCOUNTER — Telehealth: Payer: Self-pay | Admitting: *Deleted

## 2016-02-20 MED ORDER — HYDROCODONE-ACETAMINOPHEN 10-325 MG PO TABS: 1.0000 | ORAL_TABLET | Freq: Three times a day (TID) | ORAL | Status: DC | PRN

## 2016-02-20 MED ORDER — METFORMIN HCL 500 MG PO TABS: ORAL_TABLET | ORAL | Status: DC

## 2016-02-20 NOTE — Telephone Encounter (Signed)
Ok but she needs to get scheduled

## 2016-02-20 NOTE — Telephone Encounter (Addendum)
Pt states she called and canceled her surgery for 02/18/2016 with Dr. Charlsie Merlesegal, because she did not have reliable help with her dtr, Ava.  Pt states she is currently working with [facility] to get reliable help with her dtr, so that she can have surgery, but she would like a refill of the pain medication to get her to that point.  Unable to leave message [phone], but will wait until Dr. Charlsie Merlesegal advises to call alternate number.  Left message informing pt Dr. Charlsie Merlesegal refilled the Vicodin and stated pt needs to be scheduled for surgery, and she can pick up the rx [facility].

## 2016-02-26 ENCOUNTER — Other Ambulatory Visit: Payer: Self-pay

## 2016-03-17 ENCOUNTER — Telehealth: Payer: Self-pay | Admitting: *Deleted

## 2016-03-17 NOTE — Telephone Encounter (Signed)
I left patient a message that Dr. Charlsie Merlesegal wanted me to call and see how she was doing.  He said if there's anything that he can do to help you to give him a call.

## 2016-03-17 NOTE — Telephone Encounter (Addendum)
Pt communication through email with D. Miner, that she is unable to schedule surgery until summer, and hopefully will have help with her dtr, but has begun to have problem with both feet, plantar fasciitis - left, and right foot hammer toe and capsulitis.  I encouraged pt to make an appt to discuss her situation with Dr. Charlsie Merlesegal and to get possible treatment for both to give her relief.  Pt agreed and was transferred to schedulers.  03/31/2016-Dr. Regal ordered Hydrocodone 10/325mg  #20 one tablet every 8 hours.  Orders to pt. 06/04/2016-Pt emailed request for more pain medication after multiple surgery cancellations.  Dr. Charlsie Merlesegal states pt can make an appt or set up surgery but no more pain medication will be dispensed. Left message informing pt of Dr. Beverlee Nimsegal's orders. 06/08/2016-Pt asked if she could use the boot Dr. Charlsie Merlesegal gave her for this problem months ago.  I left message for pt to bring the boot to the surgery all they can say is no she needs another boot. 06/12/2016-Pt states she's trying not to be up on the foot more than 15 mins/hour or dangling. I told pt to remain in the boot at all times and keep the dressing clean and dry they were both there to protect, and position the surgery foot. Pt states she's doing okay and really hasn't taken any of the new pain medication it is too strong.  I told pt it sounded like she was doing her job and to call with concerns. 06/16/2016-Pt states since the dressing change yesterday, her foot has itched terribly, and she told the surgical center she had problems with adhesives. I called pt and had her describe the dressing. Pt states Dr. Charlsie Merlesegal covered the surgery site with gauze then wrapped with an ace, and once she removed the ace the itching was not as bad.  I asked pt if she had an allergy to Latex and she said she didn't know, but both feet were itching.  I told pt it could be dry skin from being in the same dressing for about a week, and pt states it may be.  I told pt to  use Benadryl when she was going to need to be awake, lotion to the feet, but no closer than 1 1/2 inches to the surgery sites and not between the toes. Pt states understanding. 07/07/2016-Pt asked for refill of Tylenol #3.  Dr. Everlena Cooperegal okayed refill. Left message for pt to pick up in the [place] office. 07/13/2016-Pt request refill of Tylenol #3, states also having plantar fasciitis flare in the left foot. Pt states the Tylenol #3 every 4 hours and can still function, would like Tylenol #4 if available. 08/26/2016-Pt States she is still having pain in the evening after work, asked if icing may help. I told pt I ice periodically when my feet hurt to head off inflammation. Pt states she will try after work and request refill of Tylenol #3.  Dr. Everlena Cooperegal okayed refill as previously. I gave pt her next appt date 09/10/2016 at 3:45pm. Orders for Tylenol #3 called to [facility].

## 2016-03-20 ENCOUNTER — Ambulatory Visit (INDEPENDENT_AMBULATORY_CARE_PROVIDER_SITE_OTHER): Payer: BLUE CROSS/BLUE SHIELD | Admitting: Podiatry

## 2016-03-20 ENCOUNTER — Encounter: Payer: Self-pay | Admitting: Podiatry

## 2016-03-20 VITALS — BP 142/99 | HR 82 | Resp 12

## 2016-03-20 DIAGNOSIS — M779 Enthesopathy, unspecified: Secondary | ICD-10-CM | POA: Diagnosis not present

## 2016-03-20 MED ORDER — TRIAMCINOLONE ACETONIDE 10 MG/ML IJ SUSP
10.0000 mg | Freq: Once | INTRAMUSCULAR | Status: AC
  Administered 2016-03-20: 10 mg

## 2016-03-20 MED ORDER — HYDROCODONE-ACETAMINOPHEN 10-325 MG PO TABS: 1.0000 | ORAL_TABLET | Freq: Three times a day (TID) | ORAL | Status: DC | PRN

## 2016-03-22 NOTE — Progress Notes (Signed)
Subjective:     Patient ID: Bonnie [name], female   DOB: 04-28-1971, 45 y.o.   MRN: [id number]  HPI patient states right foot feeling pretty good with some discomfort in the left foot   Review of Systems     Objective:   Physical Exam Neurovascular status intact muscle strength adequate with pain in the left lateral foot with inflammation of the tendon complex    Assessment:     Tendinitis left with inflammation noted    Plan:     Careful injection of the painful area 3 mg Kenalog 5 mill grams Xylocaine and advised on reduced activity heat and ice therapy and reappoint to recheck

## 2016-03-31 MED ORDER — HYDROCODONE-ACETAMINOPHEN 10-325 MG PO TABS: 1.0000 | ORAL_TABLET | Freq: Three times a day (TID) | ORAL | Status: DC | PRN

## 2016-04-10 ENCOUNTER — Other Ambulatory Visit: Payer: Self-pay | Admitting: Family Medicine

## 2016-04-10 NOTE — Telephone Encounter (Signed)
Refusal of this prescription-reason is patient is not had lab work or office visit regarding this in well over 12 months

## 2016-04-14 ENCOUNTER — Encounter: Payer: Self-pay | Admitting: Podiatry

## 2016-04-14 ENCOUNTER — Telehealth: Payer: Self-pay | Admitting: *Deleted

## 2016-04-14 ENCOUNTER — Encounter: Payer: Self-pay | Admitting: Nurse Practitioner

## 2016-04-14 NOTE — Telephone Encounter (Signed)
I called and left her a message that we can schedule surgery for 04/21/2016.  If that date is okay with you, give me a call and let me know.  Someone from the surgical center will call you with the arrival time on Friday or Monday prior to surgery date.  Dr. Charlsie Merlesegal is out of the office on today.  Please give me a call and advice if this date will be okay for your surgery.

## 2016-04-14 NOTE — Telephone Encounter (Signed)
"  I'm a patient of Dr. Charlsie Merlesegal.  I wanted to call and get my surgery rescheduled again if I can.  Call me when you get at chance, thanks."

## 2016-04-15 MED ORDER — HYDROCODONE-ACETAMINOPHEN 10-325 MG PO TABS: 1.0000 | ORAL_TABLET | Freq: Three times a day (TID) | ORAL | Status: DC | PRN

## 2016-04-15 NOTE — Telephone Encounter (Signed)
"  I'm not going to be able to do my surgery on June 6.  I've got too much I'm trying to get done.  I just noticed on my calendar I have a doctor's appointment the very next day in [place].  If I have the surgery on the 6th, I know I can't drive.  My husband works third shift.  So, I won't have anyone to take me.  The next week, the 13th is my daughter's 18th birthday so I can't do it that day.  So could you please look and see if he has something available on the 20th.  That is what I'm going to push for.  I been dealing with this foot for months now so I guess I'll have to wait a couple weeks.  See what he's got on the 20th.  Let me know.  I hope this is the day that works for me.  I hope this works out.  I'm taking care of my handicap daughter, my mom is going through chemo for cancer.  So much is going on.

## 2016-04-15 NOTE — Telephone Encounter (Signed)
I called and left patient a message that we can do surgery on June 20.  Dr. Charlsie Merlesegal said it's okay for you to have a refill of your medication.  You will have to come by the office to pick it up.  It can't be called into the pharmacy.

## 2016-04-15 NOTE — Telephone Encounter (Signed)
"  I'm calling you back.  I got your message yesterday evening.  If everything goes as I hope it will, June 6 will be fine for the date for my surgery.  I'm still waiting on somebody to get back with me.  I sent a message through MyChart asking Dr. Charlsie Merlesegal if he will fill my prescription one more time before the surgery.  I know they usually call later in the afternoon.  If you need anymore information give me a call.  I know you said the surgical center will call me with a time."

## 2016-04-22 ENCOUNTER — Other Ambulatory Visit: Payer: Self-pay | Admitting: Family Medicine

## 2016-04-27 ENCOUNTER — Encounter: Payer: Self-pay | Admitting: Nurse Practitioner

## 2016-05-01 ENCOUNTER — Telehealth: Payer: Self-pay | Admitting: *Deleted

## 2016-05-01 NOTE — Telephone Encounter (Signed)
"  I need to cancel my surgery scheduled for 06/20.  My daughter didn't receive a SSI check this month, nothing.  So we're short on money.  And I have to pay my part of the surgery up front.  We just don't have it.  My toe is about the same.  I'[name] sorry for any inconvenience.  I will contact the office when I cn reschedule.  And I haven't notified the surgical center yet either.  Would you do that please.  Thanks, Bonnie [name]  I called and canceled surgery with [name] at [facility].

## 2016-05-04 ENCOUNTER — Telehealth: Payer: Self-pay | Admitting: *Deleted

## 2016-05-04 NOTE — Telephone Encounter (Signed)
"  I sent a message through the website last week.  I want to make sure you got it.  I had to cancel my surgery a second time.  I had talked to the anesthesia people.  I wasn't sure which surgical place we had planned to do it at.  So unless someone from there has called them I still need to call them.  It's the same reason as before.  I still haven't gotten all my hand surgery bill paid from December.  I still owe about $900 on this.  Having this deductible this year, we're just having to pay too much money.  It's not that I don't want to get it done.  I don't want any more damage to my foot.  I'm in a bind with my special need daughter too.  We're going to have to do it eventually.  Next time I schedule, it's not going to get canceled.  I know Dr. Charlsie Merlesegal may not want to do this but I'm going to ask if he'll give me a refill of my pain medication.  If he won't, I understand.  I know it doesn't look good that I keep cancelling but I think he understands the situation with my daughter and I'm the only one working.  It's too much right now.  Don't have the money.  Give me a call and let me know what he says and if I need to call the surgical center.  Thanks for your understanding.

## 2016-05-05 MED ORDER — HYDROCODONE-ACETAMINOPHEN 10-325 MG PO TABS: 1.0000 | ORAL_TABLET | Freq: Three times a day (TID) | ORAL | Status: DC | PRN

## 2016-05-05 NOTE — Telephone Encounter (Signed)
I called and left her a message that Dr. Charlsie Merlesegal wants her to schedule an appointment to see him.  He also said you can have a refill.  You will have to come by the office to pick it up.  It can't be called into the pharmacy.

## 2016-05-05 NOTE — Telephone Encounter (Signed)
She can have another refill and would probably be worth it for me to see her and try to help her until the surgery can be done

## 2016-05-06 ENCOUNTER — Telehealth: Payer: Self-pay | Admitting: *Deleted

## 2016-05-06 ENCOUNTER — Other Ambulatory Visit: Payer: Self-pay | Admitting: Family Medicine

## 2016-05-06 NOTE — Telephone Encounter (Signed)
Bonnie [name]. [name] ordered Hydrocodone and contacted pt.

## 2016-05-06 NOTE — Telephone Encounter (Addendum)
Thanks Helaina Stefano for getting back to me.  I will call tomorrow and make an appointment to see Dr. Charlsie Merlesegal.  When I picked today 06/20 for the surgery I had no idea about an upcoming court date.  That was set for this Thursday.  So it's a good thing I did cancel.  Because I wouldn't have been able to drive.  People don't realize all there is to caring for a child with special needs.  Ava turned 18 last week.  So I have to go to court and take Ava to claim guardianship of her.  Because I'm declaring her incompetent.  So this time when I schedule the surgery, I'm going to have things planned out better.  I'm just going to have to move my mother in law in with us to help me.  I've been jumping the gun wanting this surgery done.  I didn't think it through.  The surgery is one thing but coming home after surgery is something a lot more complicated.  Just getting around to going to pee will be hard.  LOL!!!  Any way thank you for understanding my dilemma.  Thanks, Selena BattenKim

## 2016-05-11 ENCOUNTER — Ambulatory Visit (INDEPENDENT_AMBULATORY_CARE_PROVIDER_SITE_OTHER): Payer: BLUE CROSS/BLUE SHIELD | Admitting: Podiatry

## 2016-05-11 ENCOUNTER — Encounter: Payer: Self-pay | Admitting: Podiatry

## 2016-05-11 DIAGNOSIS — M779 Enthesopathy, unspecified: Secondary | ICD-10-CM

## 2016-05-11 MED ORDER — HYDROCODONE-ACETAMINOPHEN 10-325 MG PO TABS: 1.0000 | ORAL_TABLET | Freq: Three times a day (TID) | ORAL | Status: DC | PRN

## 2016-05-13 NOTE — Progress Notes (Signed)
Subjective:     Patient ID: Bonnie [name], female   DOB: 13-Oct-1971, 45 y.o.   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 Patient wants surgery and signs consent form again and is scheduled for outpatient surgery and I did write her a small prescription of Vicodin to use prior to procedure

## 2016-05-14 ENCOUNTER — Other Ambulatory Visit: Payer: Self-pay

## 2016-05-22 ENCOUNTER — Telehealth: Payer: Self-pay | Admitting: *Deleted

## 2016-05-22 NOTE — Telephone Encounter (Signed)
"  I'm calling you again to get a date for surgery.  Lord willing this one will actually happen.  There's just been so much happening.  I told Dr. Charlsie Merlesegal on my last visit that we were going to try and do it on the 11th.  My husband is having some major issues with his Type 1 Diabetes.  He has a sensor on and we have to go back on the 11th.  So I said that's out.  I want to try and schedule it for the 18th.  Call me when you get a chance.  If I don't answer, leave me a message.  Let me know if it's open or not for the 18th.  We'll go from there.  Thanks so much."

## 2016-05-26 NOTE — Telephone Encounter (Signed)
I attempted to return her call.  I left her a message to call me back. 

## 2016-06-02 NOTE — Telephone Encounter (Signed)
I attempted to return her call again.  I left another message for her to call me back.

## 2016-06-03 ENCOUNTER — Telehealth: Payer: Self-pay | Admitting: *Deleted

## 2016-06-03 NOTE — Telephone Encounter (Signed)
"  I don't know if you got my message or not.  I want to do my surgery on July 25.  Does he have time available?"  Yes he does have time available.  I'll go ahead and get it scheduled.  Have you registered with the surgical center.  "I did it before when I had scheduled surgery on 05/05/2016.  I figures I better go ahead and get this done.  I'm about to run out of my pain pills."  I'll get it scheduled.

## 2016-06-03 NOTE — Telephone Encounter (Signed)
"  I'm returning your call.  I'm sorry we're playing phone tag.  I didn't realize you called the home phone.  It's just a lot going on with my husband's Diabetes and all.  I left a message of why I couldn't do it on the 11th.  I didn't hear back from you myself to schedule for the 18th either.  I apologize for that, that was my fault.  I see where you called this morning.  If you get a chance, call me by the end of the day before you leave today, that will be fine.  If not, call me back in the morning but call my cell phone, [phone].  I'm going to shoot for the 25th now.  Hopefully nothing else will come up.  I only have a couple of my pain pills left anyway.  He gave me enough for 15 days from my last visit.  Call me as soon as you can.  I'll do my best to get your call so I won't miss it.  Sorry for this long message.

## 2016-06-09 ENCOUNTER — Encounter: Payer: Self-pay | Admitting: Podiatry

## 2016-06-09 DIAGNOSIS — M21541 Acquired clubfoot, right foot: Secondary | ICD-10-CM | POA: Diagnosis not present

## 2016-06-09 DIAGNOSIS — M2041 Other hammer toe(s) (acquired), right foot: Secondary | ICD-10-CM | POA: Diagnosis not present

## 2016-06-15 ENCOUNTER — Ambulatory Visit (INDEPENDENT_AMBULATORY_CARE_PROVIDER_SITE_OTHER): Payer: BLUE CROSS/BLUE SHIELD

## 2016-06-15 ENCOUNTER — Ambulatory Visit (INDEPENDENT_AMBULATORY_CARE_PROVIDER_SITE_OTHER): Payer: BLUE CROSS/BLUE SHIELD | Admitting: Podiatry

## 2016-06-15 ENCOUNTER — Telehealth: Payer: Self-pay | Admitting: Podiatry

## 2016-06-15 VITALS — HR 98

## 2016-06-15 DIAGNOSIS — Z9889 Other specified postprocedural states: Secondary | ICD-10-CM

## 2016-06-15 DIAGNOSIS — M2041 Other hammer toe(s) (acquired), right foot: Secondary | ICD-10-CM | POA: Diagnosis not present

## 2016-06-15 MED ORDER — HYDROCODONE-ACETAMINOPHEN 10-325 MG PO TABS: 1.0000 | ORAL_TABLET | Freq: Three times a day (TID) | ORAL | 0 refills | Status: DC | PRN

## 2016-06-15 NOTE — Telephone Encounter (Signed)
Pt had requested a call back on the wordpress because she is having issues with her surgical foot and the pain medication is to strong. Per Val pt needs to be seen today. I called at 830am, 916am and 941am and her vm is full so I could not leave a message. I called pt back at 1009am and finally got a hold of pt she is being seen at 3pm today.

## 2016-06-16 NOTE — Progress Notes (Signed)
Subjective:     Patient ID: Bonnie [name], female   DOB: 07-Apr-1971, 45 y.o.   MRN: [id number]  HPI patient presents stating my foot is doing okay but I feel like I been crying all the time   Review of Systems     Objective:   Physical Exam Neurovascular status intact muscle strength adequate with patient found to have good alignment second digit with pin in place metatarsal and good place and wound edges well coapted with mild edema noted and negative Homans sign    Assessment:     Doing well post fusion digit 2 right shortening osteotomy second metatarsal right with wound edges well coapted and good alignment    Plan:     Reviewed x-rays and reapplied sterile dressing and discussed instructed on continued elevation and also compression. If any issues should occur or any problems patient's to call us immediately and if not we'll see back in 2 weeks  X-ray report indicates that the pin is in place the second metatarsals in good position with screw in place. Also did prescribe Vicodin for pain at this time

## 2016-06-18 ENCOUNTER — Other Ambulatory Visit: Payer: Self-pay

## 2016-07-02 ENCOUNTER — Ambulatory Visit (INDEPENDENT_AMBULATORY_CARE_PROVIDER_SITE_OTHER): Payer: BLUE CROSS/BLUE SHIELD

## 2016-07-02 ENCOUNTER — Ambulatory Visit (INDEPENDENT_AMBULATORY_CARE_PROVIDER_SITE_OTHER): Payer: BLUE CROSS/BLUE SHIELD | Admitting: Podiatry

## 2016-07-02 DIAGNOSIS — Z9889 Other specified postprocedural states: Secondary | ICD-10-CM

## 2016-07-02 DIAGNOSIS — M2041 Other hammer toe(s) (acquired), right foot: Secondary | ICD-10-CM

## 2016-07-02 MED ORDER — ACETAMINOPHEN-CODEINE #3 300-30 MG PO TABS: ORAL_TABLET | ORAL | 0 refills | Status: DC

## 2016-07-02 NOTE — Progress Notes (Signed)
Subjective:     Patient ID: Bonnie [name], female   DOB: [birth year], 45 y.o.   MRN: [id number]  HPI patient states she's doing well but she still feels depressed and cries at times   Review of Systems     Objective:   Physical Exam Neurovascular status intact with good alignment of the second digit right foot with moderate elevation of the toe upon analysis    Assessment:     Doing well postop with mild elevation of the second digit    Plan:     Instructed on continued elevation and went ahead and applied a above ankle brace to lower the second toe and keep a plantarflexed. Reappoint for pin removal  X-ray indicated good alignment good positional component with mild elevation of the toe

## 2016-07-07 MED ORDER — ACETAMINOPHEN-CODEINE #3 300-30 MG PO TABS: 1.0000 | ORAL_TABLET | ORAL | 0 refills | Status: DC | PRN

## 2016-07-15 ENCOUNTER — Ambulatory Visit (INDEPENDENT_AMBULATORY_CARE_PROVIDER_SITE_OTHER): Payer: BLUE CROSS/BLUE SHIELD

## 2016-07-15 ENCOUNTER — Ambulatory Visit (INDEPENDENT_AMBULATORY_CARE_PROVIDER_SITE_OTHER): Payer: BLUE CROSS/BLUE SHIELD | Admitting: Podiatry

## 2016-07-15 DIAGNOSIS — Z9889 Other specified postprocedural states: Secondary | ICD-10-CM

## 2016-07-15 DIAGNOSIS — M722 Plantar fascial fibromatosis: Secondary | ICD-10-CM

## 2016-07-15 DIAGNOSIS — M2041 Other hammer toe(s) (acquired), right foot: Secondary | ICD-10-CM

## 2016-07-15 MED ORDER — HYDROCODONE-ACETAMINOPHEN 5-325 MG PO TABS: 1.0000 | ORAL_TABLET | Freq: Four times a day (QID) | ORAL | Status: DC | PRN

## 2016-07-15 MED ORDER — HYDROCODONE-ACETAMINOPHEN 5-325 MG PO TABS: 1.0000 | ORAL_TABLET | Freq: Four times a day (QID) | ORAL | 0 refills | Status: DC | PRN

## 2016-07-15 MED ORDER — HYDROCODONE-ACETAMINOPHEN 5-325 MG PO TABS
1.0000 | ORAL_TABLET | Freq: Four times a day (QID) | ORAL | Status: DC | PRN
Start: 2016-07-15 — End: 2016-07-15

## 2016-07-15 NOTE — Patient Instructions (Signed)

## 2016-07-15 NOTE — Progress Notes (Signed)
Subjective:     Patient ID: Bonnie [name], female   DOB: [birth year], 45 y.o.   MRN: [id number]  HPI patient presents stating I'm doing well but I am still having discomfort in my right foot   Review of Systems     Objective:   Physical Exam Neurovascular status intact negative Homans sign noted pin in place second digit right with good alignment of the toe metatarsal with wound edges that are coapted well and no erythema or drainage currently    Assessment:     Doing well post foot surgery right    Plan:     It removed second digit sterile dressing applied and took x-rays and instructed on continued lowering of the second toe. Reappoint to recheck  X-ray report indicates good alignment with everything in good alignment and slight elevation second digit that we will continue to work with bracing

## 2016-07-16 ENCOUNTER — Other Ambulatory Visit: Payer: BLUE CROSS/BLUE SHIELD

## 2016-07-16 NOTE — Progress Notes (Signed)
DOS 06/09/2016 Shortening osteotomy w/screw 2nd right, possible fusion w/pin 2nd toe right

## 2016-07-21 ENCOUNTER — Encounter: Payer: Self-pay | Admitting: Nurse Practitioner

## 2016-07-24 ENCOUNTER — Other Ambulatory Visit: Payer: Self-pay | Admitting: *Deleted

## 2016-07-24 DIAGNOSIS — M2041 Other hammer toe(s) (acquired), right foot: Secondary | ICD-10-CM

## 2016-07-24 MED ORDER — TRAMADOL HCL 50 MG PO TABS: 50.0000 mg | ORAL_TABLET | Freq: Three times a day (TID) | ORAL | 0 refills | Status: DC | PRN

## 2016-07-24 NOTE — Telephone Encounter (Signed)
I left patient a message that she can come by office to pick up her prescription.

## 2016-07-27 ENCOUNTER — Other Ambulatory Visit: Payer: Self-pay

## 2016-07-27 MED ORDER — ACETAMINOPHEN-CODEINE #3 300-30 MG PO TABS: 1.0000 | ORAL_TABLET | ORAL | 0 refills | Status: DC | PRN

## 2016-08-05 ENCOUNTER — Other Ambulatory Visit: Payer: Self-pay

## 2016-08-05 MED ORDER — ACETAMINOPHEN-CODEINE #3 300-30 MG PO TABS: 1.0000 | ORAL_TABLET | ORAL | 0 refills | Status: DC | PRN

## 2016-08-11 ENCOUNTER — Other Ambulatory Visit: Payer: BLUE CROSS/BLUE SHIELD

## 2016-08-13 ENCOUNTER — Ambulatory Visit (INDEPENDENT_AMBULATORY_CARE_PROVIDER_SITE_OTHER): Payer: BLUE CROSS/BLUE SHIELD | Admitting: Podiatry

## 2016-08-13 ENCOUNTER — Ambulatory Visit (INDEPENDENT_AMBULATORY_CARE_PROVIDER_SITE_OTHER): Payer: BLUE CROSS/BLUE SHIELD

## 2016-08-13 ENCOUNTER — Other Ambulatory Visit: Payer: BLUE CROSS/BLUE SHIELD

## 2016-08-13 DIAGNOSIS — M2041 Other hammer toe(s) (acquired), right foot: Secondary | ICD-10-CM

## 2016-08-13 DIAGNOSIS — Z9889 Other specified postprocedural states: Secondary | ICD-10-CM

## 2016-08-13 DIAGNOSIS — M722 Plantar fascial fibromatosis: Secondary | ICD-10-CM | POA: Diagnosis not present

## 2016-08-13 DIAGNOSIS — M779 Enthesopathy, unspecified: Secondary | ICD-10-CM | POA: Diagnosis not present

## 2016-08-13 MED ORDER — TRIAMCINOLONE ACETONIDE 10 MG/ML IJ SUSP
10.0000 mg | Freq: Once | INTRAMUSCULAR | Status: AC
  Administered 2016-08-13: 10 mg

## 2016-08-13 MED ORDER — ACETAMINOPHEN-CODEINE #3 300-30 MG PO TABS: 1.0000 | ORAL_TABLET | Freq: Three times a day (TID) | ORAL | 0 refills | Status: DC | PRN

## 2016-08-13 NOTE — Progress Notes (Signed)
Subjective:     Patient ID: Bonnie [name], female   DOB: 1971/06/01, 45 y.o.   MRN: [id number]  HPI patient states I'm doing well with my right foot but I am having pain in the outside of the left foot without specific injury. Patient states that the second toe right will swell she's been on it all day   Review of Systems     Objective:   Physical Exam Neurovascular status intact muscle strength adequate with inflammation around the right second MPJ low-grade but quite a bit of discomfort lateral side left foot with inflammation around the peroneal complex and into the fifth metatarsal head    Assessment:     Gradual improvement with right foot with inflammatory tendinitis left    Plan:     Reviewed condition and continue using brace to lower the second toe right and careful injection administered left 3 Milligan Kenalog 5 mg Xylocaine and did write a small prescription for Tylenol 3 due to the continuing pain she experiences. Reappoint to recheck  X-ray indicates that there is good healing of the digit with slight gapping at the proximal interphalangeal joint but I do think this will solidify and clinically there is no motion with good position of the second metatarsal as far screw goes

## 2016-08-17 ENCOUNTER — Other Ambulatory Visit: Payer: BLUE CROSS/BLUE SHIELD

## 2016-08-28 ENCOUNTER — Other Ambulatory Visit: Payer: Self-pay | Admitting: Family Medicine

## 2016-08-29 ENCOUNTER — Emergency Department (HOSPITAL_COMMUNITY): Payer: No Typology Code available for payment source

## 2016-08-29 ENCOUNTER — Encounter (HOSPITAL_COMMUNITY): Payer: Self-pay | Admitting: *Deleted

## 2016-08-29 ENCOUNTER — Emergency Department (HOSPITAL_COMMUNITY)
Admission: EM | Admit: 2016-08-29 | Discharge: 2016-08-29 | Disposition: A | Discharge status: Home / Self Care | Payer: No Typology Code available for payment source | Attending: Emergency Medicine | Admitting: Emergency Medicine

## 2016-08-29 DIAGNOSIS — Y92410 Unspecified street and highway as the place of occurrence of the external cause: Secondary | ICD-10-CM | POA: Insufficient documentation

## 2016-08-29 DIAGNOSIS — E039 Hypothyroidism, unspecified: Secondary | ICD-10-CM | POA: Diagnosis not present

## 2016-08-29 DIAGNOSIS — Y939 Activity, unspecified: Secondary | ICD-10-CM | POA: Diagnosis not present

## 2016-08-29 DIAGNOSIS — S46819A Strain of other muscles, fascia and tendons at shoulder and upper arm level, unspecified arm, initial encounter: Secondary | ICD-10-CM | POA: Insufficient documentation

## 2016-08-29 DIAGNOSIS — Y999 Unspecified external cause status: Secondary | ICD-10-CM | POA: Diagnosis not present

## 2016-08-29 DIAGNOSIS — S29012A Strain of muscle and tendon of back wall of thorax, initial encounter: Secondary | ICD-10-CM | POA: Diagnosis present

## 2016-08-29 DIAGNOSIS — Z7984 Long term (current) use of oral hypoglycemic drugs: Secondary | ICD-10-CM | POA: Diagnosis not present

## 2016-08-29 DIAGNOSIS — S46819S Strain of other muscles, fascia and tendons at shoulder and upper arm level, unspecified arm, sequela: Secondary | ICD-10-CM

## 2016-08-29 MED ORDER — HYDROCODONE-ACETAMINOPHEN 5-325 MG PO TABS
1.0000 | ORAL_TABLET | Freq: Once | ORAL | Status: AC
  Administered 2016-08-29: 1 via ORAL
  Filled 2016-08-29: qty 1

## 2016-08-29 MED ORDER — METHOCARBAMOL 500 MG PO TABS: 500.0000 mg | ORAL_TABLET | Freq: Three times a day (TID) | ORAL | 0 refills | Status: DC

## 2016-08-29 MED ORDER — ONDANSETRON HCL 4 MG PO TABS
4.0000 mg | ORAL_TABLET | Freq: Once | ORAL | Status: AC
  Administered 2016-08-29: 4 mg via ORAL
  Filled 2016-08-29: qty 1

## 2016-08-29 MED ORDER — METHOCARBAMOL 500 MG PO TABS
1000.0000 mg | ORAL_TABLET | Freq: Once | ORAL | Status: AC
  Administered 2016-08-29: 1000 mg via ORAL
  Filled 2016-08-29: qty 2

## 2016-08-29 MED ORDER — HYDROCODONE-ACETAMINOPHEN 5-325 MG PO TABS: 1.0000 | ORAL_TABLET | ORAL | 0 refills | Status: DC | PRN

## 2016-08-29 NOTE — ED Triage Notes (Signed)
C/o pain in back of head, right arm, left thumb and right hip, pain in neck
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
## 2016-08-29 NOTE — Discharge Instructions (Signed)
Vital signs within normal limits. X-ray of the cervical spine is negative for fracture or dislocation. There are some degenerative changes present. There is noted some muscle spasm involving the neck and shoulder area. X-ray of the hip and pelvis are negative for acute problem. Please use Robaxin daily for spasm pain. Use Tylenol or ibuprofen for mild discomfort. Use Norco for more severe pain. Norco and Robaxin may cause drowsiness, please do not drive, drink alcohol, operate machinery, or participate in activities requiring concentration when taking this medication.

## 2016-09-04 ENCOUNTER — Emergency Department (HOSPITAL_COMMUNITY)
Admission: EM | Admit: 2016-09-04 | Discharge: 2016-09-04 | Disposition: A | Discharge status: Home / Self Care | Payer: BLUE CROSS/BLUE SHIELD | Attending: Emergency Medicine | Admitting: Emergency Medicine

## 2016-09-04 ENCOUNTER — Encounter (HOSPITAL_COMMUNITY): Payer: Self-pay | Admitting: Emergency Medicine

## 2016-09-04 ENCOUNTER — Encounter: Payer: Self-pay | Admitting: Nurse Practitioner

## 2016-09-04 DIAGNOSIS — S161XXA Strain of muscle, fascia and tendon at neck level, initial encounter: Secondary | ICD-10-CM | POA: Diagnosis not present

## 2016-09-04 DIAGNOSIS — S199XXA Unspecified injury of neck, initial encounter: Secondary | ICD-10-CM | POA: Diagnosis present

## 2016-09-04 DIAGNOSIS — X58XXXA Exposure to other specified factors, initial encounter: Secondary | ICD-10-CM | POA: Insufficient documentation

## 2016-09-04 DIAGNOSIS — E039 Hypothyroidism, unspecified: Secondary | ICD-10-CM | POA: Insufficient documentation

## 2016-09-04 DIAGNOSIS — Y939 Activity, unspecified: Secondary | ICD-10-CM | POA: Diagnosis not present

## 2016-09-04 DIAGNOSIS — Y999 Unspecified external cause status: Secondary | ICD-10-CM | POA: Insufficient documentation

## 2016-09-04 DIAGNOSIS — Z79899 Other long term (current) drug therapy: Secondary | ICD-10-CM | POA: Diagnosis not present

## 2016-09-04 DIAGNOSIS — Y929 Unspecified place or not applicable: Secondary | ICD-10-CM | POA: Diagnosis not present

## 2016-09-04 DIAGNOSIS — M7918 Myalgia, other site: Secondary | ICD-10-CM

## 2016-09-04 DIAGNOSIS — Z7984 Long term (current) use of oral hypoglycemic drugs: Secondary | ICD-10-CM | POA: Diagnosis not present

## 2016-09-04 MED ORDER — METHOCARBAMOL 500 MG PO TABS: 500.0000 mg | ORAL_TABLET | Freq: Three times a day (TID) | ORAL | 0 refills | Status: DC

## 2016-09-04 MED ORDER — HYDROCODONE-ACETAMINOPHEN 5-325 MG PO TABS: 1.0000 | ORAL_TABLET | ORAL | 0 refills | Status: DC | PRN

## 2016-09-04 NOTE — ED Provider Notes (Signed)
[facility] Provider Note   CSN: [id number] Arrival date & time: 09/04/16  0950     History   Chief Complaint Chief Complaint  Patient presents with  . Neck Pain    HPI Paulene FloorKimberly S Sopko is a 45 y.o. female.  The history is provided by the patient. No language interpreter was used.  Neck Pain   This is a new problem. The current episode started more than 1 week ago. The problem has not changed since onset.The pain is associated with nothing. There has been no fever. The pain is present in the generalized neck. The pain radiates to the right hand and left hand. The pain is moderate. The pain is the same all the time. Pertinent negatives include no weakness. She has tried nothing for the symptoms.  Pt complains of pain in bilat hands.  Pt reports neck is very sore.  Pt had good relief with robaxin and hydrocodone.  Pt is out of both.  Past Medical History:  Diagnosis Date  . Depression   . Dysthymia 1999  . Hypothyroidism   . PCOS (polycystic ovarian syndrome) 2000    Patient Active Problem List   Diagnosis Date Noted  . Big thyroid 03/30/2014  . Dysmetabolic syndrome 03/30/2014  . 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 Morbid obesity (HCC) 10/18/2013  . Hypothyroidism 10/18/2013  . PCOS (polycystic ovarian syndrome) 10/18/2013  . Abdominal pain 09/22/2012  . Rectal bleeding 09/22/2012    Past Surgical History:  Procedure Laterality Date  . CARPAL TUNNEL RELEASE Right 10/17/2015   DR. Whitfield  . CESAREAN SECTION    . COSMETIC SURGERY    . DILATION AND CURETTAGE OF UTERUS    . FACIAL COSMETIC SURGERY     oralmaxillofacial surgery, broke jaws   . NECK SURGERY  May 2006  . WISDOM TOOTH EXTRACTION      OB History    Gravida Para Term Preterm AB Living   5       4 1    SAB TAB Ectopic Multiple Live Births   4               Home Medications    Prior to Admission medications   Medication Sig Start Date End Date Taking? Authorizing Provider    amphetamine-dextroamphetamine (ADDERALL) 30 MG tablet Take 30 mg by mouth daily.  08/14/16  Yes Historical Provider, MD  buPROPion (WELLBUTRIN SR) 200 MG 12 hr tablet Take 200 mg by mouth 2 (two) times daily.   Yes Historical Provider, MD  Fe Cbn-Fe Gluc-FA-B12-C-DSS (FERRALET 90) 90-1 MG TABS Take 1 tablet by mouth daily.  08/24/16  Yes Historical Provider, MD  fexofenadine (ALLEGRA) 180 MG tablet Take 180 mg by mouth daily.   Yes Historical Provider, MD  gabapentin (NEURONTIN) 300 MG capsule Take 600 mg by mouth daily.  08/21/16  Yes Historical Provider, MD  imipramine (TOFRANIL) 50 MG tablet Take 50 mg by mouth daily as needed.  08/27/16  Yes Historical Provider, MD  levothyroxine (SYNTHROID, LEVOTHROID) 150 MCG tablet Take 150 mcg by mouth daily before breakfast.   Yes Historical Provider, MD  metFORMIN (GLUCOPHAGE) 500 MG tablet TAKE 1 TABLET BY MOUTH TWICE DAILY WITH A MEAL 02/20/16  Yes Merlyn AlbertWilliam S Luking, MD  Multiple Vitamin (MULTIVITAMIN) tablet Take 2 tablets by mouth daily.    Yes Historical Provider, MD  acetaminophen-codeine (TYLENOL #3) 300-30 MG tablet Take 1 tablet by mouth every 8 (eight) hours as needed for moderate pain. 08/13/16   Lenn SinkNorman S Regal,  DPM  HYDROcodone-acetaminophen (NORCO/VICODIN) 5-325 MG tablet Take 1 tablet by mouth every 4 (four) hours as needed. 08/29/16   Ivery Quale, PA-C  methocarbamol (ROBAXIN) 500 MG tablet Take 1 tablet (500 mg total) by mouth 3 (three) times daily. 08/29/16   Ivery Quale, PA-C    Family History Family History  Problem Relation Age of Onset  . Diabetes Other   . Heart disease Other   . Hypertension Father   . Colon cancer Neg Hx     Social History Social History  Substance Use Topics  . Smoking status: Never Smoker  . Smokeless tobacco: Never Used  . Alcohol use No     Allergies   Augmentin [amoxicillin-pot clavulanate]; Diclofenac; Levaquin [levofloxacin in d5w]; and Tramadol   Review of Systems Review of Systems   Musculoskeletal: Positive for neck pain.  Neurological: Negative for weakness.  All other systems reviewed and are negative.    Physical Exam Updated Vital Signs BP 130/90   Pulse 80   Temp 97.9 F (36.6 C) (Oral)   Resp 18   Ht 5\' 6"  (1.676 m)   Wt 104.3 kg   SpO2 98%   BMI 37.12 kg/m   Physical Exam  Constitutional: She appears well-developed and well-nourished. No distress.  HENT:  Head: Normocephalic and atraumatic.  Right Ear: External ear normal.  Left Ear: External ear normal.  Eyes: Conjunctivae are normal.  Neck: Normal range of motion. Neck supple.  Diffusely tender c spine,  bilat hands and wrist no deformity,  Pain with range of motion  Cardiovascular: Normal rate and regular rhythm.   No murmur heard. Pulmonary/Chest: Effort normal and breath sounds normal. No respiratory distress.  Abdominal: Soft. Bowel sounds are normal. There is no tenderness.  Musculoskeletal: She exhibits no edema.  Neurological: She is alert.  Skin: Skin is warm and dry.  Psychiatric: She has a normal mood and affect.  Nursing note and vitals reviewed.    ED Treatments / Results  Labs (all labs ordered are listed, but only abnormal results are displayed) Labs Reviewed - No data to display  EKG  EKG Interpretation None       Radiology No results found.  Procedures Procedures (including critical care time)  Medications Ordered in ED Medications - No data to display   Initial Impression / Assessment and Plan / ED Course  I have reviewed the triage vital signs and the nursing notes.  Pertinent labs & imaging results that were available during my care of the patient were reviewed by me and considered in my medical decision making (see chart for details).  Clinical Course    Meds ordered this encounter  Medications  . fexofenadine (ALLEGRA) 180 MG tablet    Sig: Take 180 mg by mouth daily.  Marland Kitchen HYDROcodone-acetaminophen (NORCO/VICODIN) 5-325 MG tablet    Sig:  Take 1 tablet by mouth every 4 (four) hours as needed.    Dispense:  15 tablet    Refill:  0    Order Specific Question:   Supervising Provider    Answer:   MILLER, BRIAN [[id number]]  . methocarbamol (ROBAXIN) 500 MG tablet    Sig: Take 1 tablet (500 mg total) by mouth 3 (three) times daily.    Dispense:  30 tablet    Refill:  0    Order Specific Question:   Supervising Provider    Answer:   Eber Hong [[id number]]    Final Clinical Impressions(s) / ED Diagnoses   Final diagnoses:  Acute strain of neck muscle, initial encounter  Musculoskeletal pain    New Prescriptions New Prescriptions   No medications on file  An After Visit Summary was printed and given to the patient.   Elson Areas, PA-C 09/04/16 1109    Samuel Jester, DO 09/04/16 [phone]

## 2016-09-04 NOTE — ED Triage Notes (Signed)
PT c/o continued neck pain and right wrist pain. PT states pain was decreased with muscle relaxer that was prescribed in the ED x1 week ago. PT was ambulatory from triage into ED today.

## 2016-09-08 ENCOUNTER — Other Ambulatory Visit: Payer: Self-pay | Admitting: Family Medicine

## 2016-09-10 ENCOUNTER — Encounter: Payer: BLUE CROSS/BLUE SHIELD | Admitting: Podiatry

## 2016-09-23 ENCOUNTER — Encounter (INDEPENDENT_AMBULATORY_CARE_PROVIDER_SITE_OTHER): Payer: Self-pay | Admitting: Orthopaedic Surgery

## 2016-09-23 ENCOUNTER — Ambulatory Visit (INDEPENDENT_AMBULATORY_CARE_PROVIDER_SITE_OTHER): Payer: BLUE CROSS/BLUE SHIELD

## 2016-09-23 ENCOUNTER — Ambulatory Visit (INDEPENDENT_AMBULATORY_CARE_PROVIDER_SITE_OTHER): Payer: BLUE CROSS/BLUE SHIELD | Admitting: Orthopaedic Surgery

## 2016-09-23 VITALS — BP 124/88 | HR 85 | Ht 66.0 in | Wt 220.0 lb

## 2016-09-23 DIAGNOSIS — M79642 Pain in left hand: Secondary | ICD-10-CM | POA: Diagnosis not present

## 2016-09-23 MED ORDER — METHYLPREDNISOLONE ACETATE 40 MG/ML IJ SUSP
40.0000 mg | INTRAMUSCULAR | Status: AC | PRN
  Administered 2016-09-23: 40 mg via INTRA_ARTICULAR

## 2016-09-23 MED ORDER — LIDOCAINE HCL 1 % IJ SOLN
0.5000 mL | INTRAMUSCULAR | Status: AC | PRN
  Administered 2016-09-23: .5 mL

## 2016-09-23 NOTE — Progress Notes (Signed)
Office Visit Note   Patient: Bonnie [name]           Date of Birth: 13-Aug-1971           MRN: [id number] Visit Date:Paulene Floor 09/23/2016              Requested by: Babs SciaraScott A Luking, MD [address] [place], [place] [address] PCP: Lilyan PuntScott Luking, MD   Assessment & Plan: Visit Diagnoses: No diagnosis found.  Plan: Inject base of left thumb.  Follow-Up Instructions: No Follow-up on file.   Orders:  No orders of the defined types were placed in this encounter.  No orders of the defined types were placed in this encounter.     Procedures: Small Joint Inj Date/Time: 09/23/2016 10:39 AM Performed by: Valeria BatmanWHITFIELD, Calissa Swenor W Authorized by: Valeria BatmanWHITFIELD, Llewellyn Schoenberger W   Consent Given by:  Patient Timeout: prior to procedure the correct patient, procedure, and site was verified   Indications:  Pain Location:  Thumb Site:  L thumb CMC Needle Size:  27 G Approach:  Dorsal Ultrasound Guided: No   Fluoroscopic Guidance: No   Medications:  0.5 mL lidocaine 1 %; 40 mg methylPREDNISolone acetate 40 MG/ML Aspiration Attempted: No       Clinical Data: No additional findings.   Subjective: No chief complaint on file.   Pt has recurring Carpel Tunnel pain BIL hand pain.  Pt has Carpel Tunnel surgery 10/2015 BIL hands.    Review of Systems  Constitutional: Negative.   HENT: Negative.   Eyes: Negative.   Respiratory: Negative.   Cardiovascular: Negative.   Gastrointestinal: Negative.   Genitourinary: Negative.   Musculoskeletal: Negative.   Skin: Negative.   Neurological: Negative.   Hematological: Negative.   Psychiatric/Behavioral: Negative.      Objective: Vital Signs: There were no vitals taken for this visit.  Physical Exam  Ortho Exam exam of the left thumb demonstrates a positive grind test. There was tenderness about the thumb CMC joint without obvious deformity. She has had a prior carpal tunnel release without any symptoms. Incision is nicely healed. No swelling  of her thumb neurovascularly intact.  Specialty Comments:  No specialty comments available.  Imaging: No results found.   PMFS History: Patient Active Problem List   Diagnosis Date Noted  . Big thyroid 03/30/2014  . Dysmetabolic syndrome 03/30/2014  . 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 Morbid obesity (HCC) 10/18/2013  . Hypothyroidism 10/18/2013  . PCOS (polycystic ovarian syndrome) 10/18/2013  . Abdominal pain 09/22/2012  . Rectal bleeding 09/22/2012   Past Medical History:  Diagnosis Date  . Depression   . Dysthymia 1999  . Hypothyroidism   . PCOS (polycystic ovarian syndrome) 2000    Family History  Problem Relation Age of Onset  . Diabetes Other   . Heart disease Other   . Hypertension Father   . Colon cancer Neg Hx     Past Surgical History:  Procedure Laterality Date  . CARPAL TUNNEL RELEASE Right 10/17/2015   DR. Makenzee Choudhry  . CESAREAN SECTION    . COSMETIC SURGERY    . DILATION AND CURETTAGE OF UTERUS    . FACIAL COSMETIC SURGERY     oralmaxillofacial surgery, broke jaws   . NECK SURGERY  May 2006  . WISDOM TOOTH EXTRACTION     Social History   Occupational History  . stay at home    Social History Main Topics  . Smoking status: Never Smoker  . Smokeless tobacco: Never Used  . Alcohol  use No  . Drug use: No  . Sexual activity: Not on file

## 2016-09-24 ENCOUNTER — Other Ambulatory Visit (INDEPENDENT_AMBULATORY_CARE_PROVIDER_SITE_OTHER): Payer: Self-pay

## 2016-09-24 ENCOUNTER — Ambulatory Visit: Payer: BLUE CROSS/BLUE SHIELD | Admitting: Podiatry

## 2016-10-01 ENCOUNTER — Encounter: Payer: BLUE CROSS/BLUE SHIELD | Admitting: Podiatry

## 2016-10-15 NOTE — Progress Notes (Signed)
This encounter was created in error - please disregard.

## 2016-10-19 ENCOUNTER — Ambulatory Visit: Payer: BLUE CROSS/BLUE SHIELD | Admitting: Podiatry

## 2016-10-20 ENCOUNTER — Encounter: Payer: Self-pay | Admitting: Nurse Practitioner

## 2016-10-21 ENCOUNTER — Ambulatory Visit: Payer: BLUE CROSS/BLUE SHIELD | Admitting: Podiatry

## 2016-10-28 ENCOUNTER — Ambulatory Visit (INDEPENDENT_AMBULATORY_CARE_PROVIDER_SITE_OTHER): Payer: BLUE CROSS/BLUE SHIELD

## 2016-10-28 ENCOUNTER — Encounter: Payer: Self-pay | Admitting: Podiatry

## 2016-10-28 ENCOUNTER — Ambulatory Visit (INDEPENDENT_AMBULATORY_CARE_PROVIDER_SITE_OTHER): Payer: BLUE CROSS/BLUE SHIELD | Admitting: Podiatry

## 2016-10-28 DIAGNOSIS — Z9889 Other specified postprocedural states: Secondary | ICD-10-CM

## 2016-10-28 DIAGNOSIS — M7750 Other enthesopathy of unspecified foot: Secondary | ICD-10-CM | POA: Diagnosis not present

## 2016-10-28 DIAGNOSIS — M79672 Pain in left foot: Secondary | ICD-10-CM | POA: Diagnosis not present

## 2016-10-28 DIAGNOSIS — M2041 Other hammer toe(s) (acquired), right foot: Secondary | ICD-10-CM

## 2016-10-28 DIAGNOSIS — M779 Enthesopathy, unspecified: Secondary | ICD-10-CM

## 2016-10-28 MED ORDER — TRIAMCINOLONE ACETONIDE 10 MG/ML IJ SUSP
10.0000 mg | Freq: Once | INTRAMUSCULAR | Status: AC
  Administered 2016-10-28: 10 mg

## 2016-10-28 NOTE — Progress Notes (Signed)
Subjective:     Patient ID: Bonnie [name], female   DOB: 01/02/1971, 45 y.o.   MRN: [id number]  HPI patient states I'm doing well but I've had some pain that's developed in my left foot around the outside that makes it hard for me to walk comfortably. Patient states the right foot that we did surgery on is doing well with mild numbness   Review of Systems     Objective:   Physical Exam Neurovascular status intact muscle strength adequate with forefoot inflammation right that's mild in nature with well-healed surgical site second metatarsal second digit with good alignment and no longer has pain underneath the metatarsal phalangeal joint. For the left foot I did note quite a bit of discomfort in the peroneal insertion    Assessment:     Peroneal tendinitis left with well-healed surgical site right with mild numbness which should go away over time    Plan:     H&P x-rays reviewed and at this time careful sheath injection administered left 3 mg dexamethasone Kenalog 5 mg Xylocaine and advised on fascial brace usage. Instructed on heat ice therapy and reappoint to recheck again in the next several weeks  X-ray report indicated that there is well-healed surgical site second metatarsal right second digit right and that the left foot shows no indications of stress fracture fracture

## 2016-11-03 ENCOUNTER — Other Ambulatory Visit: Payer: Self-pay | Admitting: Podiatry

## 2016-11-16 ENCOUNTER — Encounter (HOSPITAL_COMMUNITY): Payer: Self-pay

## 2016-11-16 ENCOUNTER — Emergency Department (HOSPITAL_COMMUNITY): Payer: BLUE CROSS/BLUE SHIELD

## 2016-11-16 ENCOUNTER — Emergency Department (HOSPITAL_COMMUNITY)
Admission: EM | Admit: 2016-11-16 | Discharge: 2016-11-16 | Disposition: A | Discharge status: Home / Self Care | Payer: BLUE CROSS/BLUE SHIELD | Attending: Emergency Medicine | Admitting: Emergency Medicine

## 2016-11-16 DIAGNOSIS — R1030 Lower abdominal pain, unspecified: Secondary | ICD-10-CM

## 2016-11-16 DIAGNOSIS — R1032 Left lower quadrant pain: Secondary | ICD-10-CM | POA: Insufficient documentation

## 2016-11-16 DIAGNOSIS — E039 Hypothyroidism, unspecified: Secondary | ICD-10-CM | POA: Diagnosis not present

## 2016-11-16 DIAGNOSIS — Z7984 Long term (current) use of oral hypoglycemic drugs: Secondary | ICD-10-CM | POA: Diagnosis not present

## 2016-11-16 LAB — COMPREHENSIVE METABOLIC PANEL
ALT: 18 U/L (ref 14–54)
AST: 19 U/L (ref 15–41)
Albumin: 3.4 g/dL — ABNORMAL LOW (ref 3.5–5.0)
Alkaline Phosphatase: 94 U/L (ref 38–126)
Anion gap: 7 (ref 5–15)
BUN: 10 mg/dL (ref 6–20)
CO2: 23 mmol/L (ref 22–32)
Calcium: 8.5 mg/dL — ABNORMAL LOW (ref 8.9–10.3)
Chloride: 104 mmol/L (ref 101–111)
Creatinine, Ser: 0.73 mg/dL (ref 0.44–1.00)
GFR calc Af Amer: >60 mL/min (ref >60)
GFR calc non Af Amer: >60 mL/min (ref >60)
Glucose, Bld: 125 mg/dL — ABNORMAL HIGH (ref 65–99)
Potassium: 3.6 mmol/L (ref 3.5–5.1)
Sodium: 134 mmol/L — ABNORMAL LOW (ref 135–145)
Total Bilirubin: 0.5 mg/dL (ref 0.3–1.2)
Total Protein: 6.4 g/dL — ABNORMAL LOW (ref 6.5–8.1)

## 2016-11-16 LAB — CBC WITH DIFFERENTIAL/PLATELET
Basophils Absolute: 0 10*3/uL (ref 0.0–0.1)
Basophils Relative: 0 %
Eosinophils Absolute: 0.3 10*3/uL (ref 0.0–0.7)
Eosinophils Relative: 5 %
HCT: 39.2 % (ref 36.0–46.0)
Hemoglobin: 13.1 g/dL (ref 12.0–15.0)
Lymphocytes Relative: 29 %
Lymphs Abs: 1.8 10*3/uL (ref 0.7–4.0)
MCH: 31.7 pg (ref 26.0–34.0)
MCHC: 33.4 g/dL (ref 30.0–36.0)
MCV: 94.9 fL (ref 78.0–100.0)
Monocytes Absolute: 0.7 10*3/uL (ref 0.1–1.0)
Monocytes Relative: 11 %
Neutro Abs: 3.4 10*3/uL (ref 1.7–7.7)
Neutrophils Relative %: 55 %
Platelets: 284 10*3/uL (ref 150–400)
RBC: 4.13 MIL/uL (ref 3.87–5.11)
RDW: 13.2 % (ref 11.5–15.5)
WBC: 6.3 10*3/uL (ref 4.0–10.5)

## 2016-11-16 LAB — URINALYSIS, ROUTINE W REFLEX MICROSCOPIC
Bilirubin Urine: NEGATIVE
Glucose, UA: NEGATIVE mg/dL
Hgb urine dipstick: NEGATIVE
Ketones, ur: NEGATIVE mg/dL
Leukocytes, UA: NEGATIVE
Nitrite: NEGATIVE
Protein, ur: NEGATIVE mg/dL
Specific Gravity, Urine: 1.015 (ref 1.005–1.030)
pH: 5.5 (ref 5.0–8.0)

## 2016-11-16 LAB — PREGNANCY, URINE: Preg Test, Ur: NEGATIVE

## 2016-11-16 MED ORDER — DIATRIZOATE MEGLUMINE & SODIUM 66-10 % PO SOLN
ORAL | Status: AC
  Filled 2016-11-16: qty 30

## 2016-11-16 MED ORDER — HYDROCODONE-ACETAMINOPHEN 5-325 MG PO TABS: 2.0000 | ORAL_TABLET | ORAL | 0 refills | Status: DC | PRN

## 2016-11-16 MED ORDER — ONDANSETRON HCL 4 MG/2ML IJ SOLN
4.0000 mg | Freq: Once | INTRAMUSCULAR | Status: AC
  Administered 2016-11-16: 4 mg via INTRAVENOUS
  Filled 2016-11-16: qty 2

## 2016-11-16 MED ORDER — HYDROMORPHONE HCL 1 MG/ML IJ SOLN
1.0000 mg | Freq: Once | INTRAMUSCULAR | Status: AC
  Administered 2016-11-16: 1 mg via INTRAVENOUS
  Filled 2016-11-16: qty 1

## 2016-11-16 MED ORDER — SODIUM CHLORIDE 0.9 % IV SOLN
Freq: Once | INTRAVENOUS | Status: AC
  Administered 2016-11-16: 1000 mL/h via INTRAVENOUS

## 2016-11-16 MED ORDER — IOPAMIDOL (ISOVUE-300) INJECTION 61%
100.0000 mL | Freq: Once | INTRAVENOUS | Status: AC | PRN
  Administered 2016-11-16: 100 mL via INTRAVENOUS

## 2016-11-16 MED ORDER — POLYETHYLENE GLYCOL 3350 17 G PO PACK: PACK | ORAL | 0 refills | Status: DC

## 2016-11-16 NOTE — ED Provider Notes (Signed)
[facility]Y DEPT Provider Note   CSN: [id number] Arrival date & time: 11/16/16  0749     History   Chief Complaint Chief Complaint  Patient presents with  . Abdominal Pain    HPI Bonnie [name] is a 46 y.o. female.  The history is provided by the patient. No language interpreter was used.  Abdominal Pain   This is a new problem. The current episode started yesterday. The problem occurs constantly. The problem has been gradually worsening. The pain is located in the LLQ. The pain is moderate. Pertinent negatives include anorexia, fever, nausea and constipation. Nothing aggravates the symptoms. Nothing relieves the symptoms. Her past medical history does not include irritable bowel syndrome.  Pt complains of lower abdominal pain.  Pain became worse overnight and is mostly in her left lower abdomen  Past Medical History:  Diagnosis Date  . Depression   . Dysthymia 1999  . Hypothyroidism   . PCOS (polycystic ovarian syndrome) 2000    Patient Active Problem List   Diagnosis Date Noted  . Big thyroid 03/30/2014  . Dysmetabolic syndrome 03/30/2014  . 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 Morbid obesity (HCC) 10/18/2013  . Hypothyroidism 10/18/2013  . PCOS (polycystic ovarian syndrome) 10/18/2013  . Abdominal pain 09/22/2012  . Rectal bleeding 09/22/2012    Past Surgical History:  Procedure Laterality Date  . CARPAL TUNNEL RELEASE Right 10/17/2015   DR. Whitfield  . CESAREAN SECTION    . COSMETIC SURGERY    . DILATION AND CURETTAGE OF UTERUS    . FACIAL COSMETIC SURGERY     oralmaxillofacial surgery, broke jaws   . NECK SURGERY  May 2006  . WISDOM TOOTH EXTRACTION      OB History    Gravida Para Term Preterm AB Living   5       4 1    SAB TAB Ectopic Multiple Live Births   4               Home Medications    Prior to Admission medications   Medication Sig Start Date End Date Taking? Authorizing Provider  amphetamine-dextroamphetamine (ADDERALL) 30 MG tablet  Take 30 mg by mouth daily.  08/14/16   Historical Provider, MD  buPROPion (WELLBUTRIN SR) 200 MG 12 hr tablet Take 200 mg by mouth 2 (two) times daily.    Historical Provider, MD  Fe Cbn-Fe Gluc-FA-B12-C-DSS (FERRALET 90) 90-1 MG TABS Take 1 tablet by mouth daily.  08/24/16   Historical Provider, MD  fexofenadine (ALLEGRA) 180 MG tablet Take 180 mg by mouth daily.    Historical Provider, MD  gabapentin (NEURONTIN) 300 MG capsule Take 600 mg by mouth daily.  08/21/16   Historical Provider, MD  HYDROcodone-acetaminophen (NORCO/VICODIN) 5-325 MG tablet Take 2 tablets by mouth every 4 (four) hours as needed. 11/16/16   Elson AreasLeslie K Sofia, PA-C  imipramine (TOFRANIL) 50 MG tablet Take 50 mg by mouth daily as needed.  08/27/16   Historical Provider, MD  levothyroxine (SYNTHROID, LEVOTHROID) 150 MCG tablet Take 150 mcg by mouth daily before breakfast.    Historical Provider, MD  metFORMIN (GLUCOPHAGE) 500 MG tablet TAKE 1 TABLET BY MOUTH TWICE DAILY WITH A MEAL 09/09/16   Campbell Richesarolyn C Hoskins, NP  methocarbamol (ROBAXIN) 500 MG tablet Take 1 tablet (500 mg total) by mouth 3 (three) times daily. Patient not taking: Reported on 09/23/2016 09/04/16   Elson AreasLeslie K Sofia, PA-C  polyethylene glycol [facility](MIRALAX) packet Take one pack twice a day for 2 days then once a  day 11/16/16   Elson Areas, PA-C    Family History Family History  Problem Relation Age of Onset  . Diabetes Other   . Heart disease Other   . Hypertension Father   . Colon cancer Neg Hx     Social History Social History  Substance Use Topics  . Smoking status: Never Smoker  . Smokeless tobacco: Never Used  . Alcohol use No     Allergies   Augmentin [amoxicillin-pot clavulanate]; Diclofenac; Levaquin [levofloxacin in d5w]; and Tramadol   Review of Systems Review of Systems  Constitutional: Negative for fever.  Gastrointestinal: Positive for abdominal pain. Negative for anorexia, constipation and nausea.  All other systems reviewed and are  negative.    Physical Exam Updated Vital Signs BP 119/84 (BP Location: Left Arm)   Pulse 71   Temp 97.4 F (36.3 C) (Oral)   Resp 16   Ht 5\' 6"  (1.676 m)   Wt 97.5 kg   SpO2 96%   BMI 34.70 kg/m   Physical Exam  Constitutional: She appears well-developed and well-nourished. No distress.  HENT:  Head: Normocephalic and atraumatic.  Eyes: Conjunctivae are normal.  Neck: Neck supple.  Cardiovascular: Normal rate and regular rhythm.   No murmur heard. Pulmonary/Chest: Effort normal and breath sounds normal. No respiratory distress.  Abdominal: Soft. There is tenderness.  Tender suprapubic and left lower quadrant.   Musculoskeletal: She exhibits no edema.  Neurological: She is alert.  Skin: Skin is warm and dry.  Psychiatric: She has a normal mood and affect.  Nursing note and vitals reviewed.    ED Treatments / Results  Labs (all labs ordered are listed, but only abnormal results are displayed) Labs Reviewed  COMPREHENSIVE METABOLIC PANEL - Abnormal; Notable for the following:       Result Value   Sodium 134 (*)    Glucose, Bld 125 (*)    Calcium 8.5 (*)    Total Protein 6.4 (*)    Albumin 3.4 (*)    All other components within normal limits  CBC WITH DIFFERENTIAL/PLATELET  URINALYSIS, ROUTINE W REFLEX MICROSCOPIC  PREGNANCY, URINE    EKG  EKG Interpretation None       Radiology Ct Abdomen Pelvis W Contrast  Result Date: 11/16/2016 CLINICAL DATA:  Pt reports lower abd pain, left side worse than right x 3 days. Reports feels like something is "going" fall out. Denies any n/v/d or abnormal vaginal bleeding. LBM 2 days ago. EXAM: CT ABDOMEN AND PELVIS WITH CONTRAST TECHNIQUE: Multidetector CT imaging of the abdomen and pelvis was performed using the standard protocol following bolus administration of intravenous contrast. CONTRAST:  ISOVUE-300 IOPAMIDOL (ISOVUE-300) INJECTION 61% COMPARISON:  09/19/2012 FINDINGS: Lower chest: Clear lung bases.  Heart  normal size. Hepatobiliary: No focal liver abnormality is seen. No gallstones, gallbladder wall thickening, or biliary dilatation. Pancreas: Unremarkable. No pancreatic ductal dilatation or surrounding inflammatory changes. Spleen: Normal in size without focal abnormality. Adrenals/Urinary Tract: Adrenal glands are unremarkable. Kidneys are normal, without renal calculi, focal lesion, or hydronephrosis. Bladder is unremarkable. Stomach/Bowel: Stomach is within normal limits. Appendix appears normal. No evidence of bowel wall thickening, distention, or inflammatory changes. Vascular/Lymphatic: No significant vascular findings are present. No enlarged abdominal or pelvic lymph nodes. Reproductive: Uterus and bilateral adnexa are unremarkable. Other: No abdominal wall hernia or abnormality. No abdominopelvic ascites. Musculoskeletal: No acute finding. There are disc degenerative changes at L4-L5 with a grade 1 anterolisthesis, stable from the prior CT. IMPRESSION: 1. No acute  findings within the abdomen pelvis. No findings to account for the patient's abdominal pain. 2. Disc degenerative changes at L4-L5, stable. No other abnormalities. Electronically Signed   By: Amie Portland M.D.   On: 11/16/2016 09:30    Procedures Procedures (including critical care time)  Medications Ordered in ED Medications  diatrizoate meglumine-sodium (GASTROGRAFIN) 66-10 % solution (not administered)  0.9 %  sodium chloride infusion ( Intravenous Stopped 11/16/16 1015)  ondansetron (ZOFRAN) injection 4 mg (4 mg Intravenous Given 11/16/16 0853)  HYDROmorphone (DILAUDID) injection 1 mg (1 mg Intravenous Given 11/16/16 0845)  iopamidol (ISOVUE-300) 61 % injection 100 mL (100 mLs Intravenous Contrast Given 11/16/16 0913)     Initial Impression / Assessment and Plan / ED Course  I have reviewed the triage vital signs and the nursing notes.  Pertinent labs & imaging results that were available during my care of the patient were reviewed  by me and considered in my medical decision making (see chart for details).  Clinical Course     Labs and ct reviewed with Dr. Juleen China.  Ct shows significant stool.   I will try giving pt miralax.   I have advised her to recheck with Dr. Gerda Diss in two days if not resolved.  Final Clinical Impressions(s) / ED Diagnoses   Final diagnoses:  Lower abdominal pain    New Prescriptions Discharge Medication List as of 11/16/2016 10:02 AM    START taking these medications   Details  polyethylene glycol (MIRALAX) packet Take one pack twice a day for 2 days then once a day, Print         Elson Areas, PA-C 11/16/16 1122    Raeford Razor, MD 11/19/16 1353

## 2016-11-16 NOTE — ED Triage Notes (Signed)
Pt reports lower abd pain, left side worse than right x 3 days. Reports feels like something is "going" fall out.  Denies any n/v/d or abnormal vaginal bleeding.  LBM 2 days ago.

## 2016-11-16 NOTE — ED Notes (Signed)
EDP at bedside updating patient. 

## 2016-11-16 NOTE — ED Notes (Signed)
Pt transported to CT ?

## 2016-11-16 NOTE — Discharge Instructions (Signed)
See Dr. Gerda DissLuking for recheck in 2 days.

## 2016-11-19 ENCOUNTER — Encounter: Payer: Self-pay | Admitting: Gastroenterology

## 2016-11-19 ENCOUNTER — Ambulatory Visit (INDEPENDENT_AMBULATORY_CARE_PROVIDER_SITE_OTHER): Payer: BLUE CROSS/BLUE SHIELD | Admitting: Gastroenterology

## 2016-11-19 DIAGNOSIS — R140 Abdominal distension (gaseous): Secondary | ICD-10-CM | POA: Insufficient documentation

## 2016-11-19 DIAGNOSIS — R1032 Left lower quadrant pain: Secondary | ICD-10-CM | POA: Diagnosis not present

## 2016-11-19 MED ORDER — CIPROFLOXACIN HCL 500 MG PO TABS: 500.0000 mg | ORAL_TABLET | Freq: Two times a day (BID) | ORAL | 0 refills | Status: DC

## 2016-11-19 MED ORDER — HYDROCODONE-ACETAMINOPHEN 5-325 MG PO TABS: 1.0000 | ORAL_TABLET | ORAL | 0 refills | Status: DC | PRN

## 2016-11-19 MED ORDER — METRONIDAZOLE 500 MG PO TABS: 500.0000 mg | ORAL_TABLET | Freq: Three times a day (TID) | ORAL | 0 refills | Status: DC

## 2016-11-19 NOTE — Patient Instructions (Addendum)
1. Take Cipro and Flagyl for 10 days with food. Call if you have worsening abdominal pain, diarrhea, any signs of intolerance to the medication.  2. Consume soft foods/liquids for next 2 days. Avoid high fiber foods until you feel better.  3. Return to the office in two weeks.

## 2016-11-19 NOTE — Progress Notes (Signed)
Primary Care Physician:  Lilyan PuntScott Luking, MD  Primary Gastroenterologist:  Jonette EvaSandi Fields, MD   Chief Complaint  Patient presents with  . Abdominal Pain    LLQ, went to ER 11/16/16. Pain started 11/14/16  . Bloated    HPI:  Bonnie [name] is a 46 y.o. female here For further evaluation of left lower quadrant pain. Seen once by our practice back in 2013. Rectal bleeding at the time. Had plan on colonoscopy but she never followed up for that. [facility], PCOS, prediabetes. She is on Saxenda shot every day, for weight loss management. She has diminished appetite on this medication. If she tries to eat too much she feels bloated. Bloating occurs in the upper abdomen. On December 30, GI symptoms which went to a "new level". She felt miserable, bigger and more bloated than when pregnant. Symptoms associated with left lower quadrant pain worse with movement. Symptoms progressive over 2 days. Seen in the emergency department. Nothing on labs or CT to explain her symptoms. States the ER provider put her on MiraLAX thinking maybe she was backed up. She states she disagrees with this because she typically has 1-2 stools daily, [place] 4-7 on metformin. Took MiraLAX and have watery diarrhea. No improvement in her bloating or left lower quadrant pain. She describes left lower quadrant pain worse and severe menstrual cramps or labor. She thought it was a GYN issue so she saw her gynecologist yesterday who performed pelvic ultrasound without etiology to her symptoms. They directed her to us. Denies fever, dysuria. She has occasional heartburn but nothing severe. Takes Tums with relief. No NSAIDs or aspirin. A rectal bleeding or melena.    Current Outpatient Prescriptions  Medication Sig Dispense Refill  . amphetamine-dextroamphetamine (ADDERALL) 30 MG tablet Take 20 mg by mouth daily.     Marland Kitchen. buPROPion (WELLBUTRIN SR) 200 MG 12 hr tablet Take 200 mg by mouth 2  (two) times daily.    . fexofenadine (ALLEGRA) 180 MG tablet Take 180 mg by mouth daily.    Marland Kitchen. levothyroxine (SYNTHROID, LEVOTHROID) 150 MCG tablet Take 150 mcg by mouth daily before breakfast.    . Liraglutide -Weight Management (SAXENDA [place]) Inject into the skin.    . metFORMIN (GLUCOPHAGE) 500 MG tablet TAKE 1 TABLET BY MOUTH TWICE DAILY WITH A MEAL (Patient taking differently: TAKE 1 TABLET BY MOUTH TWICE DAILY WITH A MEAL. Taking once a day) 60 tablet 0  . polyethylene glycol (MIRALAX) packet Take one pack twice a day for 2 days then once a day 14 each 0  .     0  .    0  .        Current Facility-Administered Medications  Medication Dose Route Frequency Provider Last Rate Last Dose  . triamcinolone acetonide (KENALOG) 10 MG/ML injection 10 mg  10 mg Other Once Lenn SinkNorman S Regal, DPM        Allergies as of 11/19/2016 - Review Complete 11/19/2016  Allergen Reaction Noted  . Augmentin [amoxicillin-pot clavulanate]  09/30/2013  . Diclofenac Other (See Comments) 01/13/2016  . Levaquin [levofloxacin in d5w]  10/02/2013  . Tramadol  01/21/2016    Past Medical History:  Diagnosis Date  . Depression   . Dysthymia 1999  . Hypothyroidism   . PCOS (polycystic ovarian syndrome) 2000  . Prediabetes     Past Surgical History:  Procedure Laterality Date  . CARPAL TUNNEL RELEASE Right 10/17/2015   DR. Whitfield  .  CESAREAN SECTION    . COSMETIC SURGERY    . DILATION AND CURETTAGE OF UTERUS    . ENDOMETRIAL ABLATION    . FACIAL COSMETIC SURGERY     oralmaxillofacial surgery, broke jaws   . NECK SURGERY  May 2006  . WISDOM TOOTH EXTRACTION      Family History  Problem Relation Age of Onset  . Diabetes Other   . Heart disease Other   . Hypertension Father   . Cancer Mother     adrenal, lung, pituitary, lymph nodes, ?primary  . Colon cancer Neg Hx     Social History   Social History  . Marital status: Married    Spouse name: N/A  . Number of children: 1  . Years of  education: N/A   Occupational History  . stay at home    Social History Main Topics  . Smoking status: Never Smoker  . Smokeless tobacco: Never Used  . Alcohol use No  . Drug use: No  . Sexual activity: Not on file   Other Topics Concern  . Not on file   Social History Narrative  . No narrative on file      ROS:  General: Negative for anorexia, Unintentional weight loss, fever, chills, fatigue, weakness. Eyes: Negative for vision changes.  ENT: Negative for hoarseness, difficulty swallowing , nasal congestion. CV: Negative for chest pain, angina, palpitations, dyspnea on exertion, peripheral edema.  Respiratory: Negative for dyspnea at rest, dyspnea on exertion, cough, sputum, wheezing.  GI: See history of present illness. GU:  Negative for dysuria, hematuria, urinary incontinence, urinary frequency, nocturnal urination.  MS: Negative for joint pain, low back pain.  Derm: Negative for rash or itching.  Neuro: Negative for weakness, abnormal sensation, seizure, frequent headaches, memory loss, confusion.  Psych: Negative for anxiety, depression, suicidal ideation, hallucinations.  Endo: Negative for unusual weight change.  Heme: Negative for bruising or bleeding. Allergy: Negative for rash or hives.    Physical Examination:  BP (!) 136/91   Pulse 95   Temp 98 F (36.7 C) (Oral)   Ht 5\' 6"  (1.676 m)   Wt 234 lb 9.6 oz (106.4 kg)   BMI 37.87 kg/m    General: Well-nourished, well-developed in no acute distress.  Head: Normocephalic, atraumatic.   Eyes: Conjunctiva pink, no icterus. Mouth: Oropharyngeal mucosa moist and pink , no lesions erythema or exudate. Neck: Supple without thyromegaly, masses, or lymphadenopathy.  Lungs: Clear to auscultation bilaterally.  Heart: Regular rate and rhythm, no murmurs rubs or gallops.  Abdomen: Bowel sounds are normal, moderate left lower quadrant tenderness, nondistended, no hepatosplenomegaly or masses, no abdominal bruits or     hernia , no rebound or guarding.   Rectal: Not performed Extremities: No lower extremity edema. No clubbing or deformities.  Neuro: Alert and oriented x 4 , grossly normal neurologically.  Skin: Warm and dry, no rash or jaundice.   Psych: Alert and cooperative, normal mood and affect.  Labs: Lab Results  Component Value Date   CREATININE 0.73 11/16/2016   BUN 10 11/16/2016   NA 134 (L) 11/16/2016   K 3.6 11/16/2016   CL 104 11/16/2016   CO2 23 11/16/2016   Lab Results  Component Value Date   ALT 18 11/16/2016   AST 19 11/16/2016   ALKPHOS 94 11/16/2016   BILITOT 0.5 11/16/2016   Lab Results  Component Value Date   WBC 6.3 11/16/2016   HGB 13.1 11/16/2016   HCT 39.2 11/16/2016  MCV 94.9 11/16/2016   PLT 284 11/16/2016   No results found for: LIPASE   Imaging Studies: Ct Abdomen Pelvis W Contrast  Result Date: 11/16/2016 CLINICAL DATA:  Pt reports lower abd pain, left side worse than right x 3 days. Reports feels like something is "going" fall out. Denies any n/v/d or abnormal vaginal bleeding. LBM 2 days ago. EXAM: CT ABDOMEN AND PELVIS WITH CONTRAST TECHNIQUE: Multidetector CT imaging of the abdomen and pelvis was performed using the standard protocol following bolus administration of intravenous contrast. CONTRAST:  ISOVUE-300 IOPAMIDOL (ISOVUE-300) INJECTION 61% COMPARISON:  09/19/2012 FINDINGS: Lower chest: Clear lung bases.  Heart normal size. Hepatobiliary: No focal liver abnormality is seen. No gallstones, gallbladder wall thickening, or biliary dilatation. Pancreas: Unremarkable. No pancreatic ductal dilatation or surrounding inflammatory changes. Spleen: Normal in size without focal abnormality. Adrenals/Urinary Tract: Adrenal glands are unremarkable. Kidneys are normal, without renal calculi, focal lesion, or hydronephrosis. Bladder is unremarkable. Stomach/Bowel: Stomach is within normal limits. Appendix appears normal. No evidence of bowel wall thickening,  distention, or inflammatory changes. Vascular/Lymphatic: No significant vascular findings are present. No enlarged abdominal or pelvic lymph nodes. Reproductive: Uterus and bilateral adnexa are unremarkable. Other: No abdominal wall hernia or abnormality. No abdominopelvic ascites. Musculoskeletal: No acute finding. There are disc degenerative changes at L4-L5 with a grade 1 anterolisthesis, stable from the prior CT. IMPRESSION: 1. No acute findings within the abdomen pelvis. No findings to account for the patient's abdominal pain. 2. Disc degenerative changes at L4-L5, stable. No other abnormalities. Electronically Signed   By: Amie Portland M.D.   On: 11/16/2016 09:30

## 2016-11-20 ENCOUNTER — Encounter: Payer: Self-pay | Admitting: Gastroenterology

## 2016-11-20 NOTE — Progress Notes (Signed)
cc'ed to pcp °

## 2016-11-20 NOTE — Assessment & Plan Note (Signed)
46 year old female seen in 2013 for rectal bleeding, patient never had her colonoscopy is recommended. Versus back today with several month history of upper abdominal bloating in the setting of Saxenda for weight loss management. December 30 developed severe abdominal distention/left lower quadrant pain progressive over 2 days, went to the ED for evaluation. CT without explanation for her symptoms. Labs without significant findings.  It is unclear how much of her GI symptoms can be explained by [place]. Discussed other etiologies of acute left lower quadrant pain such as diverticulitis, colitis, IBS. CT without significant findings. Cannot rule out early onset or mild diverticulitis/colitis. Cannot rule out small bowel bacterial overgrowth as a cause of bloating and distention. Discussed empirically treated with Cipro and Flagyl with short interval follow-up. Ultimately she will likely need a colonoscopy. She'll go to a low-residue, soft diet for now. Go to the ED for worsening symptoms or call with any questions or concerns.

## 2016-12-08 ENCOUNTER — Ambulatory Visit: Payer: BLUE CROSS/BLUE SHIELD | Admitting: Gastroenterology

## 2016-12-08 ENCOUNTER — Encounter: Payer: Self-pay | Admitting: Gastroenterology

## 2016-12-08 ENCOUNTER — Telehealth: Payer: Self-pay | Admitting: Gastroenterology

## 2016-12-08 NOTE — Telephone Encounter (Signed)
PT WAS A NO SHOW AND LETTER SENT  °

## 2016-12-09 ENCOUNTER — Ambulatory Visit: Payer: BLUE CROSS/BLUE SHIELD | Admitting: Gastroenterology

## 2017-02-12 ENCOUNTER — Encounter: Payer: Self-pay | Admitting: Nurse Practitioner

## 2017-02-12 ENCOUNTER — Ambulatory Visit (INDEPENDENT_AMBULATORY_CARE_PROVIDER_SITE_OTHER): Payer: BLUE CROSS/BLUE SHIELD | Admitting: Nurse Practitioner

## 2017-02-12 VITALS — BP 122/86 | Ht 66.0 in | Wt 234.0 lb

## 2017-02-12 DIAGNOSIS — M7711 Lateral epicondylitis, right elbow: Secondary | ICD-10-CM | POA: Diagnosis not present

## 2017-02-12 DIAGNOSIS — T148XXA Other injury of unspecified body region, initial encounter: Secondary | ICD-10-CM | POA: Diagnosis not present

## 2017-02-12 MED ORDER — MELOXICAM 15 MG PO TABS: 15.0000 mg | ORAL_TABLET | Freq: Every day | ORAL | 0 refills | Status: DC

## 2017-02-12 MED ORDER — METHOCARBAMOL 500 MG PO TABS
500.0000 mg | ORAL_TABLET | Freq: Three times a day (TID) | ORAL | 0 refills | Status: DC | PRN
Start: 2017-02-12 — End: 2017-07-12

## 2017-02-12 MED ORDER — HYDROCODONE-ACETAMINOPHEN 5-325 MG PO TABS: 1.0000 | ORAL_TABLET | ORAL | 0 refills | Status: DC | PRN

## 2017-02-12 NOTE — Patient Instructions (Signed)
Tennis Elbow Tennis elbow (lateral epicondylitis) is inflammation of the outer tendons of your forearm close to your elbow. Your tendons attach your muscles to your bones. The outer tendons of your forearm are used to extend your wrist, and they attach on the outside part of your elbow. Tennis elbow is often found in people who play tennis, but anyone may get the condition from repeatedly extending the wrist or turning the forearm. What are the causes? This condition is caused by repeatedly extending your wrist and using your hands. It can result from sports or work that requires repetitive forearm movements. Tennis elbow may also be caused by an injury. What increases the risk? You have a higher risk of developing tennis elbow if you play tennis or another racquet sport. You also have a higher risk if you frequently use your hands for work. This condition is also more likely to develop in:  Musicians.  Carpenters, painters, and plumbers.  Cooks.  Cashiers.  People who work in factories.  Construction workers.  Butchers.  People who use computers. What are the signs or symptoms? Symptoms of this condition include:  Pain and tenderness in your forearm and the outer part of your elbow. You may only feel the pain when you use your arm, or you may feel it even when you are not using your arm.  A burning feeling that runs from your elbow through your arm.  Weak grip in your hands. How is this diagnosed? This condition may be diagnosed by medical history and physical exam. You may also have other tests, including:  X-rays.  MRI. How is this treated? Your health care provider will recommend lifestyle adjustments, such as resting and icing your arm. Treatment may also include:  Medicines for inflammation. This may include shots of cortisone if your pain continues.  Physical therapy. This may include massage or exercises.  An elbow brace. Surgery may eventually be recommended if  your pain does not go away with treatment. Follow these instructions at home: Activity  Rest your elbow and wrist as directed by your health care provider. Try to avoid any activities that caused the problem until your health care provider says that you can do them again.  If a physical therapist teaches you exercises, do all of them as directed.  If you lift an object, lift it with your palm facing upward. This lowers the stress on your elbow. Lifestyle  If your tennis elbow is caused by sports, check your equipment and make sure that:  You are using it correctly.  It is the best fit for you.  If your tennis elbow is caused by work, take breaks frequently, if you are able. Talk with your manager about how to best perform tasks in a way that is safe.  If your tennis elbow is caused by computer use, talk with your manager about any changes that can be made to your work environment. General instructions  If directed, apply ice to the painful area:  Put ice in a plastic bag.  Place a towel between your skin and the bag.  Leave the ice on for 20 minutes, 2-3 times per day.  Take medicines only as directed by your health care provider.  If you were given a brace, wear it as directed by your health care provider.  Keep all follow-up visits as directed by your health care provider. This is important. Contact a health care provider if:  Your pain does not get better with treatment.    Your pain gets worse.  You have numbness or weakness in your forearm, hand, or fingers. This information is not intended to replace advice given to you by your health care provider. Make sure you discuss any questions you have with your health care provider. Document Released: 11/02/2005 Document Revised: 07/02/2016 Document Reviewed: 10/29/2014 Elsevier Interactive Patient Education  2017 [employer]  

## 2017-02-15 ENCOUNTER — Encounter: Payer: Self-pay | Admitting: Nurse Practitioner

## 2017-02-15 NOTE — Progress Notes (Signed)
Subjective:  Presents for c/o pain in the right elbow and arm that began 3-4 weeks ago. Began after sweeping carpet with a broom. No neck, shoulder or wrist pain. No numbness or weakness of the arm. Difficulty straightening the arm due to tenderness and tightness. Worse with rotation of the lower arm.   Objective:   BP 122/86   Ht  (1.676 m)   Wt 234 lb (106.1 kg)   BMI 37.77 kg/m  NAD. Normal ROM of the neck, shoulder and right wrist. Distinct tenderness noted with palpation of the right epicondyle area. Pain with full extension of the right arm along the upper and lower arm near the elbow. Strong radial pulse. Hand strength 5+ bilat. Sensation grossly intact.   Assessment:  Lateral epicondylitis of right elbow  Muscle strain    Plan:   Meds ordered this encounter  Medications  . meloxicam (MOBIC) 15 MG tablet    Sig: Take 1 tablet (15 mg total) by mouth daily. Prn pain    Dispense:  30 tablet    Refill:  0    Order Specific Question:   Supervising Provider    Answer:   Merlyn Albert [[id number]]  . HYDROcodone-acetaminophen (NORCO/VICODIN) 5-325 MG tablet    Sig: Take 1 tablet by mouth every 4 (four) hours as needed.    Dispense:  18 tablet    Refill:  0    Order Specific Question:   Supervising Provider    Answer:   Merlyn Albert [[id number]]  . methocarbamol (ROBAXIN) 500 MG tablet    Sig: Take 1 tablet (500 mg total) by mouth every 8 (eight) hours as needed for muscle spasms.    Dispense:  30 tablet    Refill:  0    Order Specific Question:   Supervising Provider    Answer:   Merlyn Albert [[id number]]   Arm band for epicondylitis. Given information on condition. Recommend follow up with her orthopedic specialist in 2 weeks if no improvement.

## 2017-02-17 ENCOUNTER — Ambulatory Visit (INDEPENDENT_AMBULATORY_CARE_PROVIDER_SITE_OTHER): Payer: BLUE CROSS/BLUE SHIELD | Admitting: Orthopaedic Surgery

## 2017-02-18 ENCOUNTER — Other Ambulatory Visit: Payer: Self-pay | Admitting: Nurse Practitioner

## 2017-02-18 MED ORDER — HYDROCODONE-ACETAMINOPHEN 10-325 MG PO TABS: 1.0000 | ORAL_TABLET | ORAL | 0 refills | Status: DC | PRN

## 2017-02-18 NOTE — Progress Notes (Signed)
NCCSR reviewed. 

## 2017-03-06 ENCOUNTER — Encounter: Payer: Self-pay | Admitting: Nurse Practitioner

## 2017-03-08 ENCOUNTER — Other Ambulatory Visit: Payer: Self-pay | Admitting: Nurse Practitioner

## 2017-03-08 MED ORDER — HYDROCODONE-ACETAMINOPHEN 10-325 MG PO TABS: 1.0000 | ORAL_TABLET | ORAL | 0 refills | Status: DC | PRN

## 2017-03-17 ENCOUNTER — Encounter: Payer: Self-pay | Admitting: Nurse Practitioner

## 2017-03-17 ENCOUNTER — Other Ambulatory Visit: Payer: Self-pay | Admitting: Nurse Practitioner

## 2017-03-17 MED ORDER — HYDROCODONE-ACETAMINOPHEN 10-325 MG PO TABS: 1.0000 | ORAL_TABLET | ORAL | 0 refills | Status: DC | PRN

## 2017-03-31 ENCOUNTER — Encounter (INDEPENDENT_AMBULATORY_CARE_PROVIDER_SITE_OTHER): Payer: BLUE CROSS/BLUE SHIELD | Admitting: Orthopaedic Surgery

## 2017-03-31 ENCOUNTER — Ambulatory Visit: Payer: BLUE CROSS/BLUE SHIELD | Admitting: Podiatry

## 2017-03-31 DIAGNOSIS — M79672 Pain in left foot: Secondary | ICD-10-CM

## 2017-04-01 NOTE — Progress Notes (Signed)
   Office Visit Note   Patient: Bonnie [name]           Date of Birth: [birth year]           MRN: [id number] Visit Date: 03/31/2017              Requested by: Babs SciaraLuking, Scott A, MD [address] [place], [place] [address] PCP: Babs SciaraLuking, Scott A, MD   Assessment & Plan: Visit Diagnoses:  1. ERRONEOUS ENCOUNTER--DISREGARD   pt did not make appt  Plan:   Follow-Up Instructions: No Follow-up on file.   Orders:  No orders of the defined types were placed in this encounter.  No orders of the defined types were placed in this encounter.     Procedures: No procedures performed   Clinical Data: No additional findings.   Subjective: Chief Complaint  Patient presents with  . Left Foot - Pain    HPI  Review of Systems   Objective: Vital Signs: There were no vitals taken for this visit.  Physical Exam  Ortho Exam  Specialty Comments:  No specialty comments available.  Imaging: No results found.   PMFS History: Patient Active Problem List   Diagnosis Date Noted  . LLQ pain 11/19/2016  . Bloating 11/19/2016  . Big thyroid 03/30/2014  . Dysmetabolic syndrome 03/30/2014  . 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 Morbid obesity (HCC) 10/18/2013  . Hypothyroidism 10/18/2013  . PCOS (polycystic ovarian syndrome) 10/18/2013  . Abdominal pain 09/22/2012  . Rectal bleeding 09/22/2012   Past Medical History:  Diagnosis Date  . Depression   . Dysthymia 1999  . Hypothyroidism   . PCOS (polycystic ovarian syndrome) 2000  . Prediabetes     Family History  Problem Relation Age of Onset  . Diabetes Other   . Heart disease Other   . Hypertension Father   . Cancer Mother        adrenal, lung, pituitary, lymph nodes, ?primary  . Colon cancer Neg Hx     Past Surgical History:  Procedure Laterality Date  . CARPAL TUNNEL RELEASE Right 10/17/2015   DR. Makalyn Lennox  . CESAREAN SECTION    . COSMETIC SURGERY    . DILATION AND CURETTAGE OF UTERUS    . ENDOMETRIAL  ABLATION    . FACIAL COSMETIC SURGERY     oralmaxillofacial surgery, broke jaws   . NECK SURGERY  May 2006  . WISDOM TOOTH EXTRACTION     Social History   Occupational History  . stay at home    Social History Main Topics  . Smoking status: Never Smoker  . Smokeless tobacco: Never Used  . Alcohol use No  . Drug use: No  . Sexual activity: Not on file     Valeria BatmanPeter W Hansel Devan, MD   Note - This record has been created using [employer] software.  Chart creation errors have been sought, but may not always  have been located. Such creation errors do not reflect on  the standard of medical care.

## 2017-04-15 ENCOUNTER — Ambulatory Visit: Payer: BLUE CROSS/BLUE SHIELD | Admitting: Podiatry

## 2017-06-03 ENCOUNTER — Encounter: Payer: Self-pay | Admitting: Nurse Practitioner

## 2017-06-03 ENCOUNTER — Other Ambulatory Visit: Payer: Self-pay | Admitting: Nurse Practitioner

## 2017-06-03 MED ORDER — HYDROCODONE-ACETAMINOPHEN 10-325 MG PO TABS: 1.0000 | ORAL_TABLET | ORAL | 0 refills | Status: DC | PRN

## 2017-06-03 NOTE — Progress Notes (Signed)
NCCSR reviewed. 

## 2017-06-10 ENCOUNTER — Ambulatory Visit (INDEPENDENT_AMBULATORY_CARE_PROVIDER_SITE_OTHER): Payer: BLUE CROSS/BLUE SHIELD | Admitting: Nurse Practitioner

## 2017-06-10 ENCOUNTER — Encounter: Payer: Self-pay | Admitting: Nurse Practitioner

## 2017-06-10 VITALS — BP 118/70 | Ht 66.0 in | Wt 225.0 lb

## 2017-06-10 DIAGNOSIS — M5441 Lumbago with sciatica, right side: Secondary | ICD-10-CM

## 2017-06-10 DIAGNOSIS — E039 Hypothyroidism, unspecified: Secondary | ICD-10-CM

## 2017-06-10 DIAGNOSIS — E161 Other hypoglycemia: Secondary | ICD-10-CM | POA: Diagnosis not present

## 2017-06-10 MED ORDER — LEVOTHYROXINE SODIUM 150 MCG PO TABS: 150.0000 ug | ORAL_TABLET | Freq: Every day | ORAL | 5 refills | Status: DC

## 2017-06-10 MED ORDER — HYDROCODONE-ACETAMINOPHEN 10-325 MG PO TABS: 1.0000 | ORAL_TABLET | ORAL | 0 refills | Status: DC | PRN

## 2017-06-11 ENCOUNTER — Encounter: Payer: Self-pay | Admitting: Nurse Practitioner

## 2017-06-11 NOTE — Progress Notes (Signed)
Subjective:  Presents for complaints of pain in the right low back area radiating into the buttock/hip to the upper anterior thigh area. Does not radiate down leg. No history of injury but is seeing a specialist for chronic right foot issues including recent foot surgery which has affected her gait. Pain worse with prolonged sitting or lying on her right side. Has tried multiple meds through [facility] for [facility], insulin and PCOS with good temporary results but gains the weight back. Taking Metformin once daily. Ran out of pain pills prescribed on 7/19 stating that it said to "take them every 4 hours". Some emotional eating related to grieving over the loss of her mother.   Objective:   BP 118/70   Ht 5\' 6"  (1.676 m)   Wt 225 lb (102.1 kg)   BMI 36.32 kg/m  NAD. Alert, oriented. Mildly anxious affect. Lungs clear. Heart RRR. Distinct tenderness right lumbar area into right buttock. SLR neg left, pos right. Reflexes normal lower extremities. Gait slow but steady. Sits tilted toward the left to avoid pressure on right buttock.   Assessment:   Problem List Items Addressed This Visit      Digestive   Hyperinsulinemia     Endocrine   Hypothyroidism   Relevant Medications   levothyroxine (SYNTHROID, LEVOTHROID) 150 MCG tablet     Other   Morbid obesity (HCC)    Other Visit Diagnoses    Acute right-sided low back pain with right-sided sciatica    -  Primary   Relevant Medications   HYDROcodone-acetaminophen (NORCO) 10-325 MG tablet       Plan:   Meds ordered this encounter  Medications  . levothyroxine (SYNTHROID, LEVOTHROID) 150 MCG tablet    Sig: Take 1 tablet (150 mcg total) by mouth daily before breakfast.    Dispense:  30 tablet    Refill:  5    Please dispense name brand Synthroid    Order Specific Question:   Supervising Provider    Answer:   Merlyn AlbertLUKING, WILLIAM S [[id number]]  . HYDROcodone-acetaminophen (NORCO) 10-325 MG tablet    Sig: Take 1 tablet by mouth every  4 (four) hours as needed.    Dispense:  30 tablet    Refill:  0    Order Specific Question:   Supervising Provider    Answer:   Merlyn AlbertLUKING, WILLIAM S [[id number]]   Switch to name brand Synthroid to see if this will help her energy level. Given one more Rx for Hydrocodone. Patient understands this will be the last one. To take only for severe pain to make it tolerable. Rx should last at least one month. NCSSR reviewed. Reviewed addiction potential at length. Follow up with specialist regarding foot pain since this is probably contributing to back pain related to change in gait.  Over 50% of this visit was spent in discussion and consultation.  Return if symptoms worsen or fail to improve.

## 2017-07-04 ENCOUNTER — Encounter: Payer: Self-pay | Admitting: Nurse Practitioner

## 2017-07-12 ENCOUNTER — Encounter: Payer: Self-pay | Admitting: Nurse Practitioner

## 2017-07-12 ENCOUNTER — Ambulatory Visit (INDEPENDENT_AMBULATORY_CARE_PROVIDER_SITE_OTHER): Payer: BLUE CROSS/BLUE SHIELD | Admitting: Nurse Practitioner

## 2017-07-12 VITALS — BP 126/82 | Ht 66.0 in | Wt 227.5 lb

## 2017-07-12 DIAGNOSIS — M25551 Pain in right hip: Secondary | ICD-10-CM

## 2017-07-12 DIAGNOSIS — M5441 Lumbago with sciatica, right side: Secondary | ICD-10-CM | POA: Diagnosis not present

## 2017-07-12 MED ORDER — CHLORZOXAZONE 500 MG PO TABS: 500.0000 mg | ORAL_TABLET | Freq: Three times a day (TID) | ORAL | 0 refills | Status: DC | PRN

## 2017-07-12 MED ORDER — CELECOXIB 200 MG PO CAPS: 200.0000 mg | ORAL_CAPSULE | Freq: Every day | ORAL | 0 refills | Status: DC

## 2017-07-12 MED ORDER — HYDROCODONE-ACETAMINOPHEN 10-325 MG PO TABS: 1.0000 | ORAL_TABLET | ORAL | 0 refills | Status: DC | PRN

## 2017-07-13 ENCOUNTER — Encounter: Payer: Self-pay | Admitting: Nurse Practitioner

## 2017-07-13 ENCOUNTER — Other Ambulatory Visit: Payer: Self-pay | Admitting: *Deleted

## 2017-07-13 DIAGNOSIS — M5440 Lumbago with sciatica, unspecified side: Secondary | ICD-10-CM

## 2017-07-13 NOTE — Progress Notes (Signed)
Subjective:  Presents for recheck of her right low back/hip pain. See previous notes. Worse after sitting for 20-30 minutes. Difficulty getting up and out of the bed or chair. The worse pain is going down steps. Has had chronic foot problems which have greatly improved. Her hip and back pain began after having problems with her gait related to her foot pain. Cannot lie down on her right side. Has pain in the right low back area radiating into the right hip and upper right thigh area. Does not radiate down the leg. Patient has been out of her hydrocodone for several days. Minimal relief with meloxicam. Vioxx has worked best for her in the past but no longer available. Also Robaxin caused extreme grogginess.  Objective:   BP 126/82   Ht 5\' 6"  (1.676 m)   Wt 227 lb 8 oz (103.2 kg)   BMI 36.72 kg/m  NAD. Alert, oriented. Lungs clear. Heart regular rate rhythm. Distinct tenderness noted in the right lumbar paraspinal area into the right buttock. SLR negative right side. Tenderness noted with internal rotation of the right hip. Also distinct tenderness with palpation of the right hip. Patellar reflexes normal. Gait slow but steady.  Assessment:  Acute right-sided low back pain with right-sided sciatica  Right hip pain    Plan:   Meds ordered this encounter  Medications  . amphetamine-dextroamphetamine (ADDERALL) 20 MG tablet    Sig: TK 1 T PO BID PRN    Refill:  0  . chlorzoxazone (PARAFON) 500 MG tablet    Sig: Take 1 tablet (500 mg total) by mouth 3 (three) times daily as needed for muscle spasms.    Dispense:  30 tablet    Refill:  0    Order Specific Question:   Supervising Provider    Answer:   Merlyn Albert [[id number]]  . HYDROcodone-acetaminophen (NORCO) 10-325 MG tablet    Sig: Take 1 tablet by mouth every 4 (four) hours as needed.    Dispense:  30 tablet    Refill:  0    Order Specific Question:   Supervising Provider    Answer:   Merlyn Albert [[id number]]  . celecoxib (CELEBREX)  200 MG capsule    Sig: Take 1 capsule (200 mg total) by mouth daily. Prn pain    Dispense:  30 capsule    Refill:  0    Order Specific Question:   Supervising Provider    Answer:   Merlyn Albert [[id number]]   Trial of Celebrex if her insurance will cover. Switch to chlorzoxazone for muscle spasms. Lengthy discussion regarding opiate use and addiction potential. Patient given 1 more prescription for hydrocodone 30 tablets. Use only for extreme pain. Patient cannot take tramadol. Patient understands this is a temporary solution until she can see a specialist regarding her pain. 25 minutes was spent with the patient. Greater than half the time was spent in discussion and answering questions and counseling regarding the issues that the patient came in for today.

## 2017-07-16 ENCOUNTER — Encounter: Payer: Self-pay | Admitting: Nurse Practitioner

## 2017-07-16 ENCOUNTER — Ambulatory Visit (HOSPITAL_COMMUNITY): Payer: BLUE CROSS/BLUE SHIELD

## 2017-07-16 ENCOUNTER — Other Ambulatory Visit: Payer: Self-pay | Admitting: *Deleted

## 2017-07-16 DIAGNOSIS — M549 Dorsalgia, unspecified: Secondary | ICD-10-CM

## 2017-07-20 ENCOUNTER — Encounter: Payer: Self-pay | Admitting: Nurse Practitioner

## 2017-07-22 ENCOUNTER — Encounter: Payer: Self-pay | Admitting: Nurse Practitioner

## 2017-07-22 ENCOUNTER — Other Ambulatory Visit: Payer: Self-pay | Admitting: Nurse Practitioner

## 2017-07-22 ENCOUNTER — Ambulatory Visit (HOSPITAL_COMMUNITY)
Admission: RE | Admit: 2017-07-22 | Discharge: 2017-07-22 | Disposition: A | Discharge status: Home / Self Care | Payer: BLUE CROSS/BLUE SHIELD | Source: Ambulatory Visit | Attending: Nurse Practitioner | Admitting: Nurse Practitioner

## 2017-07-22 ENCOUNTER — Encounter: Payer: Self-pay | Admitting: Family Medicine

## 2017-07-22 DIAGNOSIS — M5136 Other intervertebral disc degeneration, lumbar region: Secondary | ICD-10-CM | POA: Diagnosis not present

## 2017-07-22 DIAGNOSIS — M549 Dorsalgia, unspecified: Secondary | ICD-10-CM | POA: Diagnosis present

## 2017-07-22 DIAGNOSIS — M4316 Spondylolisthesis, lumbar region: Secondary | ICD-10-CM | POA: Insufficient documentation

## 2017-07-26 ENCOUNTER — Ambulatory Visit (HOSPITAL_COMMUNITY): Payer: BLUE CROSS/BLUE SHIELD

## 2017-08-04 ENCOUNTER — Encounter: Payer: Self-pay | Admitting: Nurse Practitioner

## 2017-08-04 DIAGNOSIS — R040 Epistaxis: Secondary | ICD-10-CM | POA: Insufficient documentation

## 2017-08-04 DIAGNOSIS — J358 Other chronic diseases of tonsils and adenoids: Secondary | ICD-10-CM | POA: Insufficient documentation

## 2017-08-04 DIAGNOSIS — J3081 Allergic rhinitis due to animal (cat) (dog) hair and dander: Secondary | ICD-10-CM | POA: Insufficient documentation

## 2017-08-04 DIAGNOSIS — J342 Deviated nasal septum: Secondary | ICD-10-CM | POA: Insufficient documentation

## 2017-08-05 ENCOUNTER — Other Ambulatory Visit: Payer: Self-pay | Admitting: Nurse Practitioner

## 2017-08-05 MED ORDER — CELECOXIB 200 MG PO CAPS: 200.0000 mg | ORAL_CAPSULE | Freq: Every day | ORAL | 0 refills | Status: DC

## 2017-08-11 ENCOUNTER — Other Ambulatory Visit: Payer: Self-pay | Admitting: Nurse Practitioner

## 2017-08-11 MED ORDER — HYDROCODONE-ACETAMINOPHEN 10-325 MG PO TABS: 1.0000 | ORAL_TABLET | ORAL | 0 refills | Status: DC | PRN

## 2017-08-18 ENCOUNTER — Other Ambulatory Visit: Payer: Self-pay | Admitting: Neurosurgery

## 2017-08-18 DIAGNOSIS — M7061 Trochanteric bursitis, right hip: Secondary | ICD-10-CM

## 2017-09-06 ENCOUNTER — Encounter: Payer: Self-pay | Admitting: Nurse Practitioner

## 2017-09-09 ENCOUNTER — Encounter: Payer: Self-pay | Admitting: Nurse Practitioner

## 2017-09-12 ENCOUNTER — Other Ambulatory Visit: Payer: Self-pay | Admitting: Nurse Practitioner

## 2017-09-12 MED ORDER — CELECOXIB 200 MG PO CAPS: 200.0000 mg | ORAL_CAPSULE | Freq: Every day | ORAL | 2 refills | Status: DC

## 2017-09-13 ENCOUNTER — Other Ambulatory Visit: Payer: Self-pay | Admitting: *Deleted

## 2017-09-13 MED ORDER — HYDROCODONE-ACETAMINOPHEN 10-325 MG PO TABS: 1.0000 | ORAL_TABLET | ORAL | 0 refills | Status: DC | PRN

## 2017-09-21 ENCOUNTER — Inpatient Hospital Stay
Admission: RE | Admit: 2017-09-21 | Discharge: 2017-09-21 | Disposition: A | Discharge status: Home / Self Care | Payer: BLUE CROSS/BLUE SHIELD | Source: Ambulatory Visit | Attending: Neurosurgery | Admitting: Neurosurgery

## 2017-09-23 DIAGNOSIS — M4316 Spondylolisthesis, lumbar region: Secondary | ICD-10-CM | POA: Diagnosis not present

## 2017-09-23 DIAGNOSIS — M7061 Trochanteric bursitis, right hip: Secondary | ICD-10-CM | POA: Diagnosis not present

## 2017-09-27 ENCOUNTER — Other Ambulatory Visit (HOSPITAL_COMMUNITY): Payer: Self-pay | Admitting: Neurosurgery

## 2017-09-27 DIAGNOSIS — M7061 Trochanteric bursitis, right hip: Secondary | ICD-10-CM

## 2017-10-04 ENCOUNTER — Encounter: Payer: Self-pay | Admitting: Nurse Practitioner

## 2017-10-04 ENCOUNTER — Other Ambulatory Visit: Payer: Self-pay | Admitting: Nurse Practitioner

## 2017-10-04 MED ORDER — HYDROCODONE-ACETAMINOPHEN 10-325 MG PO TABS: ORAL_TABLET | ORAL | 0 refills | Status: DC

## 2017-10-11 ENCOUNTER — Ambulatory Visit (HOSPITAL_COMMUNITY): Payer: BLUE CROSS/BLUE SHIELD

## 2017-10-19 ENCOUNTER — Ambulatory Visit (HOSPITAL_COMMUNITY)
Admission: RE | Admit: 2017-10-19 | Discharge: 2017-10-19 | Disposition: A | Discharge status: Home / Self Care | Payer: BLUE CROSS/BLUE SHIELD | Source: Ambulatory Visit | Attending: Neurosurgery | Admitting: Neurosurgery

## 2017-10-19 DIAGNOSIS — X58XXXA Exposure to other specified factors, initial encounter: Secondary | ICD-10-CM | POA: Insufficient documentation

## 2017-10-19 DIAGNOSIS — M7601 Gluteal tendinitis, right hip: Secondary | ICD-10-CM | POA: Insufficient documentation

## 2017-10-19 DIAGNOSIS — S76911A Strain of unspecified muscles, fascia and tendons at thigh level, right thigh, initial encounter: Secondary | ICD-10-CM | POA: Diagnosis not present

## 2017-10-19 DIAGNOSIS — M7061 Trochanteric bursitis, right hip: Secondary | ICD-10-CM | POA: Diagnosis not present

## 2017-10-19 DIAGNOSIS — R600 Localized edema: Secondary | ICD-10-CM | POA: Insufficient documentation

## 2017-10-20 DIAGNOSIS — F902 Attention-deficit hyperactivity disorder, combined type: Secondary | ICD-10-CM | POA: Diagnosis not present

## 2017-10-20 DIAGNOSIS — F331 Major depressive disorder, recurrent, moderate: Secondary | ICD-10-CM | POA: Diagnosis not present

## 2017-11-01 ENCOUNTER — Encounter: Payer: Self-pay | Admitting: Nurse Practitioner

## 2017-11-01 ENCOUNTER — Other Ambulatory Visit: Payer: Self-pay | Admitting: Nurse Practitioner

## 2017-11-01 MED ORDER — HYDROCODONE-ACETAMINOPHEN 10-325 MG PO TABS: ORAL_TABLET | ORAL | 0 refills | Status: DC

## 2017-11-01 MED ORDER — CELECOXIB 200 MG PO CAPS: 200.0000 mg | ORAL_CAPSULE | Freq: Every day | ORAL | 2 refills | Status: DC

## 2017-11-22 ENCOUNTER — Encounter: Payer: Self-pay | Admitting: Nurse Practitioner

## 2017-11-24 ENCOUNTER — Ambulatory Visit (INDEPENDENT_AMBULATORY_CARE_PROVIDER_SITE_OTHER): Payer: BLUE CROSS/BLUE SHIELD | Admitting: Orthopaedic Surgery

## 2017-11-24 DIAGNOSIS — M7752 Other enthesopathy of left foot: Secondary | ICD-10-CM | POA: Diagnosis not present

## 2017-11-24 DIAGNOSIS — M25572 Pain in left ankle and joints of left foot: Secondary | ICD-10-CM | POA: Diagnosis not present

## 2017-11-27 ENCOUNTER — Encounter: Payer: Self-pay | Admitting: Nurse Practitioner

## 2017-11-29 ENCOUNTER — Encounter (INDEPENDENT_AMBULATORY_CARE_PROVIDER_SITE_OTHER): Payer: Self-pay | Admitting: Orthopaedic Surgery

## 2017-11-29 ENCOUNTER — Ambulatory Visit (INDEPENDENT_AMBULATORY_CARE_PROVIDER_SITE_OTHER): Payer: Self-pay

## 2017-11-29 ENCOUNTER — Ambulatory Visit (INDEPENDENT_AMBULATORY_CARE_PROVIDER_SITE_OTHER): Payer: BLUE CROSS/BLUE SHIELD | Admitting: Orthopaedic Surgery

## 2017-11-29 ENCOUNTER — Encounter: Payer: Self-pay | Admitting: Nurse Practitioner

## 2017-11-29 VITALS — BP 128/76 | HR 89 | Resp 14 | Ht 64.0 in | Wt 227.0 lb

## 2017-11-29 DIAGNOSIS — M79642 Pain in left hand: Secondary | ICD-10-CM

## 2017-11-29 MED ORDER — METHYLPREDNISOLONE ACETATE 40 MG/ML IJ SUSP
20.0000 mg | INTRAMUSCULAR | Status: AC | PRN
  Administered 2017-11-29: 20 mg

## 2017-11-29 MED ORDER — LIDOCAINE HCL 1 % IJ SOLN
0.5000 mL | INTRAMUSCULAR | Status: AC | PRN
  Administered 2017-11-29: .5 mL

## 2017-11-29 NOTE — Telephone Encounter (Signed)
Please communicate with the patient- typically once a person is seeing a neurosurgeon within 1 group no other partner will give an opinion.  Therefore if she would like to have a different opinion the options would be Dr. Shon BatonBrooks who is a orthopedist who works [place] the backs in [place] or seeing a specialist at a university center.  The patient can either make a decision based on the above or schedule follow-up office visit with Eber Jonesarolyn

## 2017-11-29 NOTE — Telephone Encounter (Signed)
Patient advised Dr Lorin PicketScott reviewed over the request.  The patient was last seen in August.  Federal and state guidelines state that we have to see the patient to refill pain medication if it has been over 90 days-therefore she needs a follow-up office visit. Patient verbalized understanding and scheduled and scheduled follow up office visit for pain management.

## 2017-11-29 NOTE — Telephone Encounter (Signed)
I reviewed over the request.  The patient was last seen in August.  Federal and state guidelines state that we have to see the patient to refill pain medication if it has been over 90 days-therefore she needs a follow-up office visit with Carolyn-if she needs a few I can do so but I cannot do a larger amount

## 2017-11-29 NOTE — Telephone Encounter (Signed)
Patient advised per Dr Lorin PicketScott- typically once a person is seeing a neurosurgeon within 1 group no other partner will give an opinion.  Therefore if she would like to have a different opinion the options would be Dr. Shon BatonBrooks who is a orthopedist who [place] backs [place] or seeing a specialist at a university center.  The patient can either make a decision based on the above or schedule follow-up office visit with Eber Jonesarolyn. Patient verbalized understanding and stated she will stick with Dr Wynetta Emeryram for now and call their office again for a follow up and to get results of MRI

## 2017-11-29 NOTE — Progress Notes (Signed)
Office Visit Note   Patient: Bonnie [name]           Date of Birth: 1971-06-26           MRN: [id number] Visit Date: 11/29/2017              Requested by: Babs SciaraLuking, Scott A, MD [address] [place], [place] [address] PCP: Babs SciaraLuking, Scott A, MD   Assessment & Plan: Visit Diagnoses:  1. Pain in left hand     Plan: Osteoarthritis base left thumb. We will repeat cortisone injection. Probably early osteoarthritis metacarpal phalangeal joint index finger  Follow-Up Instructions: Return if symptoms worsen or fail to improve.   Orders:  Orders Placed This Encounter  Procedures  . Hand/UE Inj: L thumb CMC  . XR Hand Complete Left   No orders of the defined types were placed in this encounter.     Procedures: Hand/UE Inj: L thumb CMC for osteoarthritis on 11/29/2017 12:33 PM Medications: 0.5 mL lidocaine 1 %; 20 mg methylPREDNISolone acetate 40 MG/ML      Clinical Data: No additional findings.   Subjective: Chief Complaint  Patient presents with  . Left Hand - Pain    [name] is a 47 y o here today for Left hand pain in the first knuckle left hand and CMC joint area at right thumb. She relates her left hand started hurting after her R CTR and L CTR.  Diagnosed with osteoarthritis of the base of the left thumb over a year ago. Cortisone injection made a big difference. She's had some recurrence of her pain. Also having some discomfort and mild swelling of the metacarpal phalangeal joint of the index finger. No history of injury or trauma. Prior films performed in [place] demonstrating osteoarthritis at the base of the thumb. Does complain of decreased hand grip  HPI  Review of Systems  Constitutional: Negative for chills, fatigue and fever.  Eyes: Negative for itching.  Respiratory: Negative for chest tightness and shortness of breath.   Cardiovascular: Negative for chest pain, palpitations and leg swelling.  Gastrointestinal: Negative for blood in stool,  constipation and diarrhea.  Endocrine: Negative for polyuria.  Genitourinary: Negative for dysuria.  Musculoskeletal: Positive for arthralgias, back pain and neck stiffness. Negative for joint swelling and neck pain.  Allergic/Immunologic: Negative for immunocompromised state.  Neurological: Negative for dizziness and numbness.  Hematological: Does not bruise/bleed easily.  Psychiatric/Behavioral: The patient is not nervous/anxious.      Objective: Vital Signs: BP 128/76   Pulse 89   Resp 14   Ht 5\' 4"  (1.626 m)   Wt 227 lb (103 kg)   BMI 38.96 kg/m   Physical Exam  Ortho Exam examination of left hand reveals very minimal swelling around the metacarpal phalangeal joint of the index finger. No loss of motion. No grinding. Skin intact. Full range of motion. Neurovascular exam intact. Minimal grinding at the base of the left thumb with discomfort. No deformity. Skin intact neurovascular exam intact. Good grip and release  Specialty Comments:  No specialty comments available.  Imaging: Xr Hand Complete Left  Result Date: 11/29/2017 Films of the left hand were obtained in 3 projections. There is obvious degenerative change at the base of the thumb at the metacarpal carpal joint. There is subluxation and osteophyte formation. The other area of discomfort is at the metacarpal phalangeal joint of the index finger. I don't see any abnormalities at that level    PMFS History: Patient Active  Problem List   Diagnosis Date Noted  . LLQ pain 11/19/2016  . Bloating 11/19/2016  . Big thyroid 03/30/2014  . Dysmetabolic syndrome 03/30/2014  . 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 Morbid obesity (HCC) 10/18/2013  . Hypothyroidism 10/18/2013  . PCOS (polycystic ovarian syndrome) 10/18/2013  . Abdominal pain 09/22/2012  . Rectal bleeding 09/22/2012   Past Medical History:  Diagnosis Date  . Depression   . Dysthymia 1999  . Hypothyroidism   . PCOS (polycystic ovarian syndrome) 2000  .  Prediabetes     Family History  Problem Relation Age of Onset  . Diabetes Other   . Heart disease Other   . Hypertension Father   . Cancer Mother        adrenal, lung, pituitary, lymph nodes, ?primary  . Colon cancer Neg Hx     Past Surgical History:  Procedure Laterality Date  . CARPAL TUNNEL RELEASE Right 10/17/2015   DR. Lennex Pietila  . CESAREAN SECTION    . COSMETIC SURGERY    . DILATION AND CURETTAGE OF UTERUS    . ENDOMETRIAL ABLATION    . FACIAL COSMETIC SURGERY     oralmaxillofacial surgery, broke jaws   . NECK SURGERY  May 2006  . WISDOM TOOTH EXTRACTION     Social History   Occupational History  . Occupation: stay at home  Tobacco Use  . Smoking status: Never Smoker  . Smokeless tobacco: Never Used  Substance and Sexual Activity  . Alcohol use: No  . Drug use: No  . Sexual activity: Not on file

## 2017-11-29 NOTE — Telephone Encounter (Signed)
Patient advised per Dr Bonnie [name] - typically once a person is seeing a neurosurgeon within 1 group no other partner will give an opinion.  Therefore if she would like to have a different opinion the options would be Dr. Shon [name] who is a orthopedist who works [place] the backs [place] or seeing a specialist at a university center.  The patient can either make a decision based on the above or schedule follow-up office visit with Bonnie [name]. Patient verbalized understanding and will stick with Dr Bonnie [name] for now and re connect with their office for results of MRI and follow up.

## 2017-12-01 ENCOUNTER — Encounter: Payer: Self-pay | Admitting: Family Medicine

## 2017-12-01 ENCOUNTER — Ambulatory Visit (INDEPENDENT_AMBULATORY_CARE_PROVIDER_SITE_OTHER): Payer: BLUE CROSS/BLUE SHIELD | Admitting: Family Medicine

## 2017-12-01 VITALS — BP 134/86 | Ht 64.0 in | Wt 218.0 lb

## 2017-12-01 DIAGNOSIS — M25551 Pain in right hip: Secondary | ICD-10-CM | POA: Diagnosis not present

## 2017-12-01 DIAGNOSIS — M5441 Lumbago with sciatica, right side: Secondary | ICD-10-CM | POA: Diagnosis not present

## 2017-12-01 MED ORDER — HYDROCODONE-ACETAMINOPHEN 10-325 MG PO TABS: ORAL_TABLET | ORAL | 0 refills | Status: DC

## 2017-12-01 NOTE — Progress Notes (Signed)
Referral made and pt is aware. 

## 2017-12-01 NOTE — Patient Instructions (Signed)

## 2017-12-01 NOTE — Progress Notes (Signed)
   Subjective:    Patient ID: Bonnie [name], female    DOB: [birth year], 47 y.o.   MRN: [id number]  HPI This patient was seen today for chronic pain.  Takes for right hip pain.   The medication list was reviewed and updated.   -Compliance with medication: yes  - Number patient states they take daily: 2 a day  -when was the last dose patient took? yesterday  The patient was advised the importance of maintaining medication and not using illegal substances with these.  Refills needed: none  The patient was educated that we can provide 3 monthly scripts for their medication, it is their responsibility to follow the instructions.  Side effects or complications from medications: none  Patient is aware that pain medications are meant to minimize the severity of the pain to allow their pain levels to improve to allow for better function. They are aware of that pain medications cannot totally remove their pain.  Due for UDT ( at least once per year) : none today per dr Daniya Aramburo        Review of Systems  Constitutional: Negative for activity change and appetite change.  HENT: Negative for congestion.   Respiratory: Negative for cough.   Cardiovascular: Negative for chest pain.  Gastrointestinal: Negative for abdominal pain and vomiting.  Musculoskeletal: Positive for arthralgias and back pain.  Skin: Negative for color change.  Neurological: Negative for weakness.  Psychiatric/Behavioral: Negative for confusion.       Objective:   Physical Exam  Constitutional: She appears well-nourished. No distress.  HENT:  Head: Normocephalic.  Right Ear: External ear normal.  Left Ear: External ear normal.  Eyes: Right eye exhibits no discharge. Left eye exhibits no discharge.  Neck: No tracheal deviation present.  Cardiovascular: Normal rate, regular rhythm and normal heart sounds.  No murmur heard. Pulmonary/Chest: Effort normal and breath sounds normal. No respiratory distress.  She has no wheezes. She has no rales.  Musculoskeletal: She exhibits no edema.  Lymphadenopathy:    She has no cervical adenopathy.  Neurological: She is alert.  Psychiatric: Her behavior is normal.  Vitals reviewed.  She has subjective discomfort in her lower back.  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

## 2017-12-01 NOTE — Addendum Note (Signed)
Addended by: Meredith LeedsSUTTON, CRYSTAL L on: 12/01/2017 01:06 PM   Modules accepted: Orders

## 2017-12-14 ENCOUNTER — Ambulatory Visit (INDEPENDENT_AMBULATORY_CARE_PROVIDER_SITE_OTHER): Payer: BLUE CROSS/BLUE SHIELD | Admitting: Orthopaedic Surgery

## 2017-12-22 ENCOUNTER — Ambulatory Visit (INDEPENDENT_AMBULATORY_CARE_PROVIDER_SITE_OTHER): Payer: BLUE CROSS/BLUE SHIELD | Admitting: Orthopaedic Surgery

## 2018-01-28 ENCOUNTER — Other Ambulatory Visit: Payer: Self-pay | Admitting: Nurse Practitioner

## 2018-01-28 ENCOUNTER — Encounter (INDEPENDENT_AMBULATORY_CARE_PROVIDER_SITE_OTHER): Payer: Self-pay | Admitting: Orthopaedic Surgery

## 2018-01-28 ENCOUNTER — Ambulatory Visit (INDEPENDENT_AMBULATORY_CARE_PROVIDER_SITE_OTHER): Payer: BLUE CROSS/BLUE SHIELD | Admitting: Orthopaedic Surgery

## 2018-01-28 VITALS — BP 132/90 | HR 80 | Resp 16 | Ht 66.0 in | Wt 200.0 lb

## 2018-01-28 DIAGNOSIS — R5382 Chronic fatigue, unspecified: Secondary | ICD-10-CM

## 2018-01-28 DIAGNOSIS — M79642 Pain in left hand: Secondary | ICD-10-CM | POA: Diagnosis not present

## 2018-01-28 DIAGNOSIS — M25572 Pain in left ankle and joints of left foot: Secondary | ICD-10-CM | POA: Diagnosis not present

## 2018-01-28 MED ORDER — LIDOCAINE HCL 1 % IJ SOLN
0.5000 mL | INTRAMUSCULAR | Status: AC | PRN
  Administered 2018-01-28: .5 mL

## 2018-01-28 MED ORDER — METHYLPREDNISOLONE ACETATE 40 MG/ML IJ SUSP
20.0000 mg | INTRAMUSCULAR | Status: AC | PRN
  Administered 2018-01-28: 20 mg via INTRA_ARTICULAR

## 2018-01-28 MED ORDER — ACETAMINOPHEN-CODEINE #3 300-30 MG PO TABS: ORAL_TABLET | ORAL | 0 refills | Status: DC

## 2018-01-28 NOTE — Telephone Encounter (Signed)
May give this plus for additional refills on each met medicine

## 2018-01-28 NOTE — Addendum Note (Signed)
Addended by: Thornell MuleVILLAREAL, MARIA R on: 01/28/2018 03:27 PM   Modules accepted: Orders

## 2018-01-28 NOTE — Progress Notes (Signed)
Office Visit Note   Patient: Bonnie [name]           Date of Birth: April 30, 1971           MRN: [id number] Visit Date: 01/28/2018              Requested by: Babs SciaraLuking, Scott A, MD [address] [place], [place] [id number] PCP: Babs SciaraLuking, Scott A, MD   Assessment & Plan: Visit Diagnoses:  1. Chronic fatigue   2. Pain of left hand     Plan: Dewitt HoesMenter returns for problem she is having with her left hand.  She has noticed at the base of her thumb and has had several injections with relief only to have recurrence.  She also has had some swelling across the metacarpal phalangeal joint of the index finger consistent with some early arthritis.  Injected the index metacarpal phalangeal joint with cortisone today.  Order lab studies to rule out rheumatoid arthritis also having a problem with both of her feet and will have her see Dr. Lajoyce Cornersuda. I think the problem of the left hand is osteoarthritis but because of her young age I would like to be sure there is no autoimmune problem  Follow-Up Instructions: Return if symptoms worsen or fail to improve.   Orders:  Orders Placed This Encounter  Procedures  . Small Joint Inj: L index MCP  . CBC with Differential/Platelet  . ANA  . Sedimentation rate  . Rheumatoid factor   Meds ordered this encounter  Medications  . acetaminophen-codeine (TYLENOL #3) 300-30 MG tablet    Sig: TAKE 1-2 TAB AS NEEDED FOR PAIN    Dispense:  30 tablet    Refill:  0      Procedures: Small Joint Inj: L index MCP on 01/28/2018 12:16 PM Details: dorsal approach Medications: 0.5 mL lidocaine 1 %; 20 mg methylPREDNISolone acetate 40 MG/ML      Clinical Data: No additional findings.   Subjective: Chief Complaint  Patient presents with  . Left Hand - Pain    Bonnie [name] IS 47 Y O F HERE FOR LEFT THUMB AND INDEX FINGER PAIN FOR OVER A YEAR. STILL NO RELIEF AND GETTING WORSE. ON INJURY, HAD 2 INJECTION IN THE THUMB  Has been followed recently for the problem  with her left hand..  She is where she has had recurrent pain and some compromise of her activities.  No other joints appear to bother her she has not had any rashes.  She is working at a [employer] and using her hands on a repetitive basis.  This or tingling.  Addition she has had some chronic problems with both of her feet and has seen several physicians in the past she is having some pain with her toes and her arch.  She is been seen by podiatrist with surgery on the second toe for crossover.  I would like to see have Dr. Lajoyce Cornersuda evaluate her  HPI  Review of Systems  Constitutional: Negative for fatigue and fever.  HENT: Positive for ear pain.   Eyes: Negative for pain.  Respiratory: Negative for cough and shortness of breath.   Cardiovascular: Negative for leg swelling.  Gastrointestinal: Negative for blood in stool, constipation and diarrhea.  Genitourinary: Positive for dysuria.  Musculoskeletal: Positive for neck pain. Negative for back pain.  Skin: Negative for rash and wound.  Neurological: Positive for weakness and numbness. Negative for dizziness, light-headedness and headaches.  Hematological: Does not bruise/bleed easily.  Psychiatric/Behavioral:  Positive for sleep disturbance.     Objective: Vital Signs: BP 132/90 (BP Location: Left Arm, Patient Position: Sitting, Cuff Size: Normal)   Pulse 80   Resp 16   Ht 5\' 6"  (1.676 m)   Wt 200 lb (90.7 kg)   BMI 32.28 kg/m   Physical Exam  Ortho Exam awake alert and oriented x3 comfortable sitting.  Definitely has positive grind testing and pain at the base of the left thumb with some mild swelling.  Has some pain with grip.  Neurovascular exam intact. mild swelling across the metacarpal phalangeal joint of the index finger pain with motion.  Skin intact.   Specialty Comments:  No specialty comments available.  Imaging: No results found.   PMFS History: Patient Active Problem List   Diagnosis Date Noted  . LLQ pain  11/19/2016  . Bloating 11/19/2016  . Big thyroid 03/30/2014  . Dysmetabolic syndrome 03/30/2014  . 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 Morbid obesity (HCC) 10/18/2013  . Hypothyroidism 10/18/2013  . PCOS (polycystic ovarian syndrome) 10/18/2013  . Abdominal pain 09/22/2012  . Rectal bleeding 09/22/2012   Past Medical History:  Diagnosis Date  . Depression   . Dysthymia 1999  . Hypothyroidism   . PCOS (polycystic ovarian syndrome) 2000  . Prediabetes     Family History  Problem Relation Age of Onset  . Diabetes Other   . Heart disease Other   . Hypertension Father   . Cancer Mother        adrenal, lung, pituitary, lymph nodes, ?primary  . Colon cancer Neg Hx     Past Surgical History:  Procedure Laterality Date  . CARPAL TUNNEL RELEASE Right 10/17/2015   DR. Bernard Donahoo  . CESAREAN SECTION    . COSMETIC SURGERY    . DILATION AND CURETTAGE OF UTERUS    . ENDOMETRIAL ABLATION    . FACIAL COSMETIC SURGERY     oralmaxillofacial surgery, broke jaws   . NECK SURGERY  May 2006  . WISDOM TOOTH EXTRACTION     Social History   Occupational History  . Occupation: stay at home  Tobacco Use  . Smoking status: Never Smoker  . Smokeless tobacco: Never Used  Substance and Sexual Activity  . Alcohol use: No  . Drug use: No  . Sexual activity: Not on file

## 2018-01-30 LAB — CBC WITH DIFFERENTIAL/PLATELET
BASOS PCT: 0.7 %
Basophils Absolute: 40 cells/uL (ref 0–200)
EOS PCT: 3.2 %
Eosinophils Absolute: 182 cells/uL (ref 15–500)
HCT: 41.6 % (ref 35.0–45.0)
Hemoglobin: 14.5 g/dL (ref 11.7–15.5)
Lymphs Abs: 1226 cells/uL (ref 850–3900)
MCH: 30.9 pg (ref 27.0–33.0)
MCHC: 34.9 g/dL (ref 32.0–36.0)
MCV: 88.5 fL (ref 80.0–100.0)
MONOS PCT: 9 %
MPV: 10.2 fL (ref 7.5–12.5)
Neutro Abs: 3739 cells/uL (ref 1500–7800)
Neutrophils Relative %: 65.6 %
PLATELETS: 336 10*3/uL (ref 140–400)
RBC: 4.7 10*6/uL (ref 3.80–5.10)
RDW: 12.5 % (ref 11.0–15.0)
TOTAL LYMPHOCYTE: 21.5 %
WBC mixed population: 513 cells/uL (ref 200–950)
WBC: 5.7 10*3/uL (ref 3.8–10.8)

## 2018-01-30 LAB — SEDIMENTATION RATE: SED RATE: 9 mm/h (ref 0–20)

## 2018-01-30 LAB — ANA: Anti Nuclear Antibody(ANA): POSITIVE — AB

## 2018-01-30 LAB — RHEUMATOID FACTOR: Rhuematoid fact SerPl-aCnc: <14 IU/mL (ref <14)

## 2018-01-30 LAB — ANTI-NUCLEAR AB-TITER (ANA TITER)

## 2018-02-02 ENCOUNTER — Other Ambulatory Visit (INDEPENDENT_AMBULATORY_CARE_PROVIDER_SITE_OTHER): Payer: Self-pay | Admitting: Radiology

## 2018-02-02 DIAGNOSIS — M79642 Pain in left hand: Principal | ICD-10-CM

## 2018-02-02 DIAGNOSIS — M79641 Pain in right hand: Secondary | ICD-10-CM

## 2018-02-07 ENCOUNTER — Ambulatory Visit (INDEPENDENT_AMBULATORY_CARE_PROVIDER_SITE_OTHER): Payer: BLUE CROSS/BLUE SHIELD

## 2018-02-07 ENCOUNTER — Encounter (INDEPENDENT_AMBULATORY_CARE_PROVIDER_SITE_OTHER): Payer: Self-pay | Admitting: Orthopedic Surgery

## 2018-02-07 ENCOUNTER — Ambulatory Visit (INDEPENDENT_AMBULATORY_CARE_PROVIDER_SITE_OTHER): Payer: BLUE CROSS/BLUE SHIELD | Admitting: Orthopedic Surgery

## 2018-02-07 VITALS — Ht 66.0 in | Wt 200.0 lb

## 2018-02-07 DIAGNOSIS — M79672 Pain in left foot: Secondary | ICD-10-CM | POA: Diagnosis not present

## 2018-02-07 DIAGNOSIS — M6701 Short Achilles tendon (acquired), right ankle: Secondary | ICD-10-CM | POA: Diagnosis not present

## 2018-02-07 DIAGNOSIS — Q661 Congenital talipes calcaneovarus, unspecified foot: Secondary | ICD-10-CM

## 2018-02-07 DIAGNOSIS — M79671 Pain in right foot: Secondary | ICD-10-CM

## 2018-02-07 DIAGNOSIS — M6702 Short Achilles tendon (acquired), left ankle: Secondary | ICD-10-CM | POA: Diagnosis not present

## 2018-02-07 NOTE — Progress Notes (Signed)
Office Visit Note   Patient: Bonnie [name]           Date of Birth: 29-Mar-1971           MRN: [id number] Visit Date: 02/07/2018              Requested by: Babs Sciara, MD [address] [place], [place] [address] PCP: Babs Sciara, MD  Chief Complaint  Patient presents with  . Right Foot - Pain    H/o surgery 2nd toe/MT 2015  . Left Foot - Pain      HPI: Patient is a 47 year old woman who presents with numbness and pain beneath the third and fourth metatarsal heads of both feet.  She states her feet feel cold they feel numb.  Patient is status post a Weil osteotomy for the second metatarsal on the left foot with Dr. Dellia Nims in 2015.  Patient states she was told she had a Morton's neuroma.  Patient does have custom orthotics that she is not wearing.  She is wearing high heeled shoes.  Assessment & Plan: Visit Diagnoses:  1. Pain in right foot   2. Pain in left foot   3. Achilles tendon contracture, bilateral   4. Cavovarus deformity of foot     Plan: Patient was given instructions for heel cord stretching she will do this 5 times a day a minute at a time to unload the metatarsal heads.  Recommended a stiff soled Trail running sneaker or new balance walking sneaker to unload pressure from the metatarsal heads recommended she resume using her custom orthotics.  Discussed that if we cannot improve the Achilles contracture and overloading metatarsal heads we may need to consider surgical intervention which would require a Weil osteotomies and possibly a closing dorsal wedge osteotomy for the first metatarsal.  Follow-Up Instructions: Return in about 1 month (around 03/07/2018).   Ortho Exam  Patient is alert, oriented, no adenopathy, well-dressed, normal affect, normal respiratory effort. Examination patient has a good dorsalis pedis pulse bilaterally good ankle good subtalar motion bilaterally patient has no tenderness to palpation in the web spaces no clinical  signs of a Morton's neuroma lateral compression is not painful.  She is point tender to palpation beneath the third and fourth metatarsal heads bilaterally and has callus beneath the first metatarsal head as well as the lesser toes.  She has significant Achilles contracture with dorsiflexion about 20 degrees short of neutral with her knee extended.  She has a plantar flexed first ray with a cavovarus deformity.  Imaging: Xr Foot Complete Left  Result Date: 02/07/2018 3 view radiographs of the left foot shows a short first metatarsal with bunion and bunionette deformities good joint spaces with a long second third and fourth metatarsal.  Xr Foot Complete Right  Result Date: 02/07/2018 3 view radiographs of the right foot shows a short first metatarsal with some periarticular cysts.  Status post a Weil osteotomy of the second metatarsal with a long third and fourth metatarsal with bunion and bunionette deformities.  No images are attached to the encounter.  Labs: Lab Results  Component Value Date   ESRSEDRATE 9 01/28/2018    @LABSALLVALUES (HGBA1)@  Body mass index is 32.28 kg/m.  Orders:  Orders Placed This Encounter  Procedures  . XR Foot Complete Right  . XR Foot Complete Left   No orders of the defined types were placed in this encounter.    Procedures: No procedures performed  Clinical Data: No  additional findings.  ROS:  All other systems negative, except as noted in the HPI. Review of Systems  Objective: Vital Signs: Ht 5\' 6"  (1.676 m)   Wt 200 lb (90.7 kg)   BMI 32.28 kg/m   Specialty Comments:  No specialty comments available.  PMFS History: Patient Active Problem List   Diagnosis Date Noted  . LLQ pain 11/19/2016  . Bloating 11/19/2016  . Big thyroid 03/30/2014  . Dysmetabolic syndrome 03/30/2014  . 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 Morbid obesity (HCC) 10/18/2013  . Hypothyroidism 10/18/2013  . PCOS (polycystic ovarian syndrome) 10/18/2013  .  Abdominal pain 09/22/2012  . Rectal bleeding 09/22/2012   Past Medical History:  Diagnosis Date  . Depression   . Dysthymia 1999  . Hypothyroidism   . PCOS (polycystic ovarian syndrome) 2000  . Prediabetes     Family History  Problem Relation Age of Onset  . Diabetes Other   . Heart disease Other   . Hypertension Father   . Cancer Mother        adrenal, lung, pituitary, lymph nodes, ?primary  . Colon cancer Neg Hx     Past Surgical History:  Procedure Laterality Date  . CARPAL TUNNEL RELEASE Right 10/17/2015   DR. Whitfield  . CESAREAN SECTION    . COSMETIC SURGERY    . DILATION AND CURETTAGE OF UTERUS    . ENDOMETRIAL ABLATION    . FACIAL COSMETIC SURGERY     oralmaxillofacial surgery, broke jaws   . NECK SURGERY  May 2006  . WISDOM TOOTH EXTRACTION     Social History   Occupational History  . Occupation: stay at home  Tobacco Use  . Smoking status: Never Smoker  . Smokeless tobacco: Never Used  Substance and Sexual Activity  . Alcohol use: No  . Drug use: No  . Sexual activity: Not on file

## 2018-02-17 DIAGNOSIS — F902 Attention-deficit hyperactivity disorder, combined type: Secondary | ICD-10-CM | POA: Diagnosis not present

## 2018-02-17 DIAGNOSIS — F331 Major depressive disorder, recurrent, moderate: Secondary | ICD-10-CM | POA: Diagnosis not present

## 2018-02-18 ENCOUNTER — Encounter: Payer: Self-pay | Admitting: Nurse Practitioner

## 2018-02-18 ENCOUNTER — Ambulatory Visit (INDEPENDENT_AMBULATORY_CARE_PROVIDER_SITE_OTHER): Payer: BLUE CROSS/BLUE SHIELD | Admitting: Nurse Practitioner

## 2018-02-18 VITALS — BP 128/86 | Ht 66.0 in | Wt 212.8 lb

## 2018-02-18 DIAGNOSIS — E041 Nontoxic single thyroid nodule: Secondary | ICD-10-CM | POA: Diagnosis not present

## 2018-02-18 DIAGNOSIS — E039 Hypothyroidism, unspecified: Secondary | ICD-10-CM

## 2018-02-18 DIAGNOSIS — Z79891 Long term (current) use of opiate analgesic: Secondary | ICD-10-CM

## 2018-02-18 MED ORDER — HYDROCODONE-ACETAMINOPHEN 5-325 MG PO TABS: 1.0000 | ORAL_TABLET | ORAL | 0 refills | Status: DC | PRN

## 2018-02-18 NOTE — Progress Notes (Signed)
Subjective: This patient was seen today for chronic pain  The medication list was reviewed and updated.   -Compliance with medication: yes  - Number patient states they take daily: up to twice a day  -when was the last dose patient took? Yesterday am  The patient was advised the importance of maintaining medication and not using illegal substances with these.  Here for refills and follow up  The patient was educated that we can provide 3 monthly scripts for their medication, it is their responsibility to follow the instructions.  Side effects or complications from medications: constipation relieved with OTC  Patient is aware that pain medications are meant to minimize the severity of the pain to allow their pain levels to improve to allow for better function. They are aware of that pain medications cannot totally remove their pain.  Due for UDT ( at least once per year) : today PMP reviewed.  Sees several specialists including rheumatology and orthopedics. Had recent cortisone shots in her thumbs which only helped temporarily. [facility] Has not had labs in a long time due to family issues. Has a tender area on the left side. Occasional difficulty swallowing.  Would like to decrease hydrocodone to 5 mg to see if this will work. Does not think she needs the 10 mg dose.  Also mentions that she is separated from her husband. Her family is not speaking to her because of this. States she feels so much better. Still seeing psychiatrist who she says also notices improvement in her mood.   Objective: NAD. Alert, oriented. Cheerful, calm affect. Thoughts logical, coherent and relevant. Dressed appropriately. Making good eye contact. Thyroid: one small slightly tender nodule left side outer portion. No goiter. Lungs clear. Heart RRR.  Assessment:  Problem List Items Addressed This Visit      Endocrine   Hypothyroidism - Primary   Relevant Orders   TSH   Thyroid  antibodies    Other Visit Diagnoses    Thyroid nodule       Relevant Orders   US THYROID   Encounter for long-term opiate analgesic use       Relevant Orders   ToxASSURE Select 13 (MW), Urine     Plan:  Meds ordered this encounter  Medications  . HYDROcodone-acetaminophen (NORCO/VICODIN) 5-325 MG tablet    Sig: Take 1 tablet by mouth every 4 (four) hours as needed.    Dispense:  60 tablet    Refill:  0    Order Specific Question:   Supervising Provider    Answer:   Merlyn AlbertLUKING, WILLIAM S [[id number]]   Trial of one month of 5 mg dose. Patient to give us feedback before next refill due. Labs and thyroid US pending.  Return in about 3 months (around 05/20/2018) for pain management. 25 minutes was spent with the patient.  This statement verifies that 25 minutes was indeed spent with the patient. Greater than half the time was spent in discussion, counseling and answering questions  regarding the issues that the patient came in for today as reflected in the diagnosis (s) please refer to documentation for further details.

## 2018-02-21 ENCOUNTER — Telehealth: Payer: Self-pay | Admitting: Family Medicine

## 2018-02-21 NOTE — Telephone Encounter (Signed)
LMOVM for pt - [facility]  Appt is 02/23/18 arrive 10:15am

## 2018-02-23 ENCOUNTER — Encounter: Payer: Self-pay | Admitting: Nurse Practitioner

## 2018-02-23 ENCOUNTER — Ambulatory Visit (HOSPITAL_COMMUNITY): Admission: RE | Admit: 2018-02-23 | Payer: BLUE CROSS/BLUE SHIELD | Source: Ambulatory Visit

## 2018-02-24 ENCOUNTER — Other Ambulatory Visit (HOSPITAL_COMMUNITY): Payer: BLUE CROSS/BLUE SHIELD

## 2018-02-28 LAB — TOXASSURE SELECT 13 (MW), URINE

## 2018-03-01 ENCOUNTER — Encounter: Payer: Self-pay | Admitting: Nurse Practitioner

## 2018-03-02 ENCOUNTER — Encounter: Payer: Self-pay | Admitting: Nurse Practitioner

## 2018-03-02 ENCOUNTER — Ambulatory Visit (INDEPENDENT_AMBULATORY_CARE_PROVIDER_SITE_OTHER): Payer: BLUE CROSS/BLUE SHIELD | Admitting: Podiatry

## 2018-03-02 ENCOUNTER — Ambulatory Visit (INDEPENDENT_AMBULATORY_CARE_PROVIDER_SITE_OTHER): Payer: BLUE CROSS/BLUE SHIELD

## 2018-03-02 ENCOUNTER — Encounter: Payer: Self-pay | Admitting: Podiatry

## 2018-03-02 DIAGNOSIS — M779 Enthesopathy, unspecified: Secondary | ICD-10-CM | POA: Diagnosis not present

## 2018-03-02 DIAGNOSIS — M21622 Bunionette of left foot: Secondary | ICD-10-CM

## 2018-03-02 DIAGNOSIS — M2041 Other hammer toe(s) (acquired), right foot: Secondary | ICD-10-CM

## 2018-03-02 MED ORDER — TRIAMCINOLONE ACETONIDE 10 MG/ML IJ SUSP
10.0000 mg | Freq: Once | INTRAMUSCULAR | Status: AC
  Administered 2018-03-02: 10 mg

## 2018-03-02 MED ORDER — METHYLPREDNISOLONE 4 MG PO TBPK: ORAL_TABLET | ORAL | 0 refills | Status: DC

## 2018-03-02 NOTE — Progress Notes (Signed)
Subjective:   Patient ID: Bonnie [name], female   DOB: 47 y.o.   MRN: [id number]   HPI Patient presents stating over the last 3 months she is developed a lot of discomfort in her right forefoot and does not remember specific reason.  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  I did a proximal nerve block I then aspirated the third MPJ getting out a small amount of clear fluid and injected quarter cc Dexasone Kenalog in the joint and placed on Medrol Dosepak.  Reappoint to recheck we will discuss the left foot also and the right foot and patient will be seen back 2 weeks  X-ray indicates that there is inflammatory changes but no indication of stress fracture with slight elongation third metatarsal in comparison the second metatarsal with screw in place second metatarsal digit in good alignment second digit

## 2018-03-03 ENCOUNTER — Other Ambulatory Visit: Payer: Self-pay | Admitting: Nurse Practitioner

## 2018-03-03 ENCOUNTER — Telehealth: Payer: Self-pay | Admitting: Nurse Practitioner

## 2018-03-03 MED ORDER — HYDROCODONE-ACETAMINOPHEN 10-325 MG PO TABS: ORAL_TABLET | ORAL | 0 refills | Status: DC

## 2018-03-03 NOTE — Telephone Encounter (Signed)
Patient did not do well on reduced dose of hydrocodone. See last office note. Went back to the 10 mg dose. See my chart messages. PMP reviewed.

## 2018-03-07 ENCOUNTER — Ambulatory Visit (INDEPENDENT_AMBULATORY_CARE_PROVIDER_SITE_OTHER): Payer: BLUE CROSS/BLUE SHIELD | Admitting: Orthopedic Surgery

## 2018-03-09 NOTE — Progress Notes (Signed)
Office Visit Note  Patient: Bonnie [name]             Date of Birth: 1971/11/13           MRN: [id number]             PCP: Kathyrn Drown, MD Referring: Kathyrn Drown, MD Visit Date: 03/23/2018 Occupation: Sales associate at a jewelry store    Subjective:  Pain in multiple joints   History of Present Illness: Bonnie [name] is a 47 y.o. female seen in consultation per request of Dr. Durward Fortes.  According to patient her symptoms started in 2015 with bilateral carpal tunnel syndrome.  At the time she was seen by Dr. Durward Fortes and had bilateral carpal tunnel release.  She  states she did really well after the carpal tunnel release.  Next year she started having overcrowding of toes in her right foot.  She was seen by podiatrist and was diagnosed with arthritis and left foot plantar fasciitis.  She was advised surgery and she underwent a pin placement in her right second MTP joint.  She says states she had residual numbness in her right second third and fourth toe since then.  She also continued to have ongoing swelling and discomfort in her right foot.  Over time she started having pain in her bilateral feet.  She states she has difficulty walking and poor balance due to that.  She has been going back to her podiatrist and had cortisone injections to her bilateral MTP joints.  She states recently she was having increased pain and swelling in her foot and she had cortisone injection to her left first and second MCP joint and had also aspiration of 1 of the MTP joints.  She was advised surgery on her bilateral feet.  She was also given a dose of prednisone pack which is helpful.  She has been experiencing some swelling in her left first and second MCP joint for which she has seen Dr. Durward Fortes and had cortisone injections by Dr. Durward Fortes.  She states she possibly had an injury to her left hand after throwing trash can in the garbage bin.  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  She also has severe tendinosis of right gluteal minimus tendon.  For the lower back pain she had MRI of her lumbar spine which showed disc disease and facet joint arthropathy.  She denies any radiculopathy.  She has been experiencing hair loss due to hypothyroidism and fatigue due to hypothyroidism.  Activities of Daily Living:  Patient reports morning stiffness for 10 minutes.   Patient Reports nocturnal pain.  Difficulty dressing/grooming: Denies Difficulty climbing stairs: Reports Difficulty getting out of chair: Reports Difficulty using hands for taps, buttons, cutlery, and/or writing: Denies   Review of Systems  Constitutional: Positive for fatigue. Negative for night sweats, weight gain and weight loss.  HENT: Positive for mouth dryness. Negative for mouth sores, trouble swallowing, trouble swallowing and nose dryness.   Eyes: Negative for pain, redness, visual disturbance and dryness.  Respiratory: Negative for cough, shortness of breath and difficulty breathing.   Cardiovascular: Negative for chest pain, palpitations, hypertension, irregular heartbeat and swelling in legs/feet.  Gastrointestinal: Negative for blood in stool, constipation and diarrhea.  Endocrine: Negative for increased urination.  Genitourinary: Negative for vaginal dryness.  Musculoskeletal: Positive for arthralgias, joint pain, joint swelling and morning stiffness. Negative for myalgias, muscle weakness, muscle  tenderness and myalgias.  Skin: Positive for hair loss. Negative for color change, rash, skin tightness, ulcers and sensitivity to sunlight.  Allergic/Immunologic: Negative for susceptible to infections.  Neurological: Negative for dizziness, memory loss, night sweats and weakness.  Hematological: Negative for swollen glands.  Psychiatric/Behavioral: Positive for depressed mood and sleep disturbance. The patient is not nervous/anxious.      PMFS History:  Patient Active Problem List   Diagnosis Date Noted  . LLQ pain 11/19/2016  . Bloating 11/19/2016  . Big thyroid 03/30/2014  . Dysmetabolic syndrome 48/54/6270  . 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 Morbid obesity ([place]) 10/18/2013  . Hypothyroidism 10/18/2013  . PCOS (polycystic ovarian syndrome) 10/18/2013  . Abdominal pain 09/22/2012  . Rectal bleeding 09/22/2012    Past Medical History:  Diagnosis Date  . Depression   . Dysthymia 1999  . Hypothyroidism   . PCOS (polycystic ovarian syndrome) 2000  . Prediabetes     Family History  Problem Relation Age of Onset  . Diabetes Other   . Hypertension Father   . Diabetes Father   . Cancer Mother        adrenal, lung, pituitary, lymph nodes, ?primary  . Diabetes Brother   . Healthy Daughter   . Colon cancer Neg Hx    Past Surgical History:  Procedure Laterality Date  . CARPAL TUNNEL RELEASE Right 10/17/2015   DR. Whitfield  . CARPAL TUNNEL RELEASE Left   . CESAREAN SECTION    . COSMETIC SURGERY    . DILATION AND CURETTAGE OF UTERUS    . ENDOMETRIAL ABLATION    . FACIAL COSMETIC SURGERY     oralmaxillofacial surgery, broke jaws   . FOOT SURGERY    . NECK SURGERY  May 2006  . WISDOM TOOTH EXTRACTION     Social History   Social History Narrative  . Not on file     Objective: Vital Signs: BP 119/80 (BP Location: Left Arm, Patient Position: Sitting, Cuff Size: Normal)   Pulse 86   Resp 17   Ht 5' 6"  (1.676 m)   Wt 208 lb (94.3 kg)   BMI 33.57 kg/m    Physical Exam  Constitutional: She is oriented to person, place, and time. She appears well-developed and well-nourished.  HENT:  Head: Normocephalic and atraumatic.  Eyes: Conjunctivae and EOM are normal.  Neck: Normal range of motion.  Cardiovascular: Normal rate, regular rhythm, normal heart sounds and intact distal pulses.  Pulmonary/Chest: Effort normal and breath sounds normal.  Abdominal: Soft. Bowel sounds are normal.  Lymphadenopathy:     She has no cervical adenopathy.  Neurological: She is alert and oriented to person, place, and time.  Skin: Skin is warm and dry. Capillary refill takes less than 2 seconds.  Thinning of hair  Psychiatric: She has a normal mood and affect. Her behavior is normal.  Nursing note and vitals reviewed.    Musculoskeletal Exam:C-spine image range of motion.  Thoracic spine good range of motion.  Lumbar spine limited range of motion.  Shoulder joints elbow joints wrist joints were in good range of motion.  She has some synovial thickening over her left first and second MCPs.  She also has tenderness on palpation over her left wrist joint left second MCP joint.  Hip joints were in good range of motion.  Knee joints were in good range of motion.  She had high arches with swelling over her left foot MTPs.  She is overcrowding of toes.  She has a left  first and fifth MTP tenderness.  But no swelling was noted.  CDAI Exam: No CDAI exam completed.    Investigation: No additional findings. Component     Latest Ref Rng & Units 01/28/2018  ANA Pattern 1      HOMOGENEOUS (A)  ANA Titer 1     titer 1:80 (H)  Anit Nuclear Antibody(ANA)     NEGATIVE POSITIVE (A)  Sed Rate     0 - 20 mm/h 9  RA Latex Turbid.     <14 IU/mL <14   CBC Latest Ref Rng & Units 01/28/2018 11/16/2016 10/02/2013  WBC 3.8 - 10.8 Thousand/uL 5.7 6.3 3.6(L)  Hemoglobin 11.7 - 15.5 g/dL 14.5 13.1 12.2  Hematocrit 35.0 - 45.0 % 41.6 39.2 36.8  Platelets 140 - 400 Thousand/uL 336 284 335   CMP Latest Ref Rng & Units 11/16/2016 10/02/2013 06/21/2008  Glucose 65 - 99 mg/dL 125(H) 104(H) 116(H)  BUN 6 - 20 mg/dL 10 9 7   Creatinine 0.44 - 1.00 mg/dL 0.73 0.80 0.68  Sodium 135 - 145 mmol/L 134(L) 136 139  Potassium 3.5 - 5.1 mmol/L 3.6 4.3 3.9  Chloride 101 - 111 mmol/L 104 104 101  CO2 22 - 32 mmol/L 23 25 26   Calcium 8.9 - 10.3 mg/dL 8.5(L) 9.0 9.2  Total Protein 6.5 - 8.1 g/dL 6.4(L) 6.4 7.1  Total Bilirubin 0.3 - 1.2 mg/dL 0.5  0.6 0.8  Alkaline Phos 38 - 126 U/L 94 78 43  AST 15 - 41 U/L 19 13 16   ALT 14 - 54 U/L 18 12 12      Imaging: Dg Foot 2 Views Right  Result Date: 03/02/2018 Please see detailed radiograph report in office note.   Speciality Comments: No specialty comments available.    Procedures:  No procedures performed Allergies: Augmentin [amoxicillin-pot clavulanate]; Diclofenac; Levaquin [levofloxacin in d5w]; and Tramadol   Assessment / Plan:     Visit Diagnoses: Pain in both hands - RF -, Sed rate WNL.  I reviewed her x-rays done by Dr. with feel of her left hand which showed second and third MCP narrowing.  This could be seen with inflammatory arthritis or crystal induced arthropathy.  She also gives history of injury to her left hand once.  He brought some pictures of her left hand which showed swelling of her left second and third MCP joints.  I will obtain following labs today and also will schedule ultrasound of her left hand.  She is currently on prednisone taper.  She states prior to that she had more swelling.  Pain in both feet -she has been having swelling in her right foot and also some plantar fasciitis.  She has tenderness on palpation of her MTPs.  I will obtain following labs.  She is also concerned about the etiology of her left foot swelling and would like MRI.  I will schedule MRI for left foot.  Plan: Sedimentation rate, Angiotensin converting enzyme, RKY-H06 antigen, Cyclic citrul peptide antibody, IgG, 14-3-3 eta Protein, Uric acid, Iron, TIBC and Ferritin Panel, HLA-B27 antigen  Pain of right hip joint-I reviewed MRI of her left hip joint with her today.  Positive ANA (antinuclear antibody) - 1:80 Homogeneous.  I believe ANA is low titer and nonspecific she does not have any clinical features of lupus or related diseases.  DDD (degenerative disc disease), cervical - Status post fusion.  She has limited range of motion.  DDD (degenerative disc disease), lumbar - And facet  joint arthropathy.  She  is chronic pain.  Hypothyroidism, unspecified type  PCOS (polycystic ovarian syndrome)  History of depression  Other fatigue - Plan: Urinalysis, Routine w reflex microscopic, CK, Magnesium    Orders: Orders Placed This Encounter  Procedures  . MR FOOT RIGHT WO CONTRAST  . Urinalysis, Routine w reflex microscopic  . CK  . Magnesium  . Sedimentation rate  . Angiotensin converting enzyme  . HLA-B27 antigen  . Cyclic citrul peptide antibody, IgG  . 14-3-3 eta Protein  . Uric acid  . Iron, TIBC and Ferritin Panel  . HLA-B27 antigen   No orders of the defined types were placed in this encounter.   Face-to-face time spent with patient was 90 minutes. >50% of time was spent in counseling and coordination of care.  Follow-Up Instructions: Return for Polyarthralgia.   Bo Merino, MD  Note - This record has been created using Editor, commissioning.  Chart creation errors have been sought, but may not always  have been located. Such creation errors do not reflect on  the standard of medical care.

## 2018-03-16 ENCOUNTER — Telehealth: Payer: Self-pay | Admitting: *Deleted

## 2018-03-16 ENCOUNTER — Ambulatory Visit: Payer: BLUE CROSS/BLUE SHIELD | Admitting: Podiatry

## 2018-03-16 NOTE — Telephone Encounter (Signed)
Pt states she saw Dr. Charlsie Merles a couple of weeks ago, took some fluid off of the foot and put a steroid in it, and had to cancel today's appt due to caring for special needs dtr, and reschedule an appt for this Friday. Pt would like Tylenol #3, due to the pain and the 3rd toe is pulling and swollen, and she is on the antiinflammatory.

## 2018-03-16 NOTE — Telephone Encounter (Signed)
I reviewed pt's clinicals, and Meds & Orders, and 03/03/2018 Sherie Don, NP ordered Norco 10/325mg  #60 one tablet bid, which would carry pt's pain medication coverage to 04/02/2018. I informed Dr. Charlsie Merles and he states the Norco should cover her discomfort. I informed pt of Dr. Beverlee Nims orders and to keep her appt foar 03/18/2018.

## 2018-03-18 ENCOUNTER — Encounter: Payer: Self-pay | Admitting: Podiatry

## 2018-03-18 ENCOUNTER — Ambulatory Visit (INDEPENDENT_AMBULATORY_CARE_PROVIDER_SITE_OTHER): Payer: BLUE CROSS/BLUE SHIELD | Admitting: Podiatry

## 2018-03-18 DIAGNOSIS — M21622 Bunionette of left foot: Secondary | ICD-10-CM | POA: Diagnosis not present

## 2018-03-18 DIAGNOSIS — F331 Major depressive disorder, recurrent, moderate: Secondary | ICD-10-CM | POA: Diagnosis not present

## 2018-03-18 DIAGNOSIS — M779 Enthesopathy, unspecified: Secondary | ICD-10-CM | POA: Diagnosis not present

## 2018-03-18 DIAGNOSIS — F902 Attention-deficit hyperactivity disorder, combined type: Secondary | ICD-10-CM | POA: Diagnosis not present

## 2018-03-18 MED ORDER — PREDNISONE 10 MG PO TABS: ORAL_TABLET | ORAL | 0 refills | Status: DC

## 2018-03-21 NOTE — Progress Notes (Signed)
Subjective:   Patient ID: Bonnie [name], female   DOB: 47 y.o.   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  Reappoint to recheck

## 2018-03-23 ENCOUNTER — Encounter: Payer: Self-pay | Admitting: Rheumatology

## 2018-03-23 ENCOUNTER — Telehealth: Payer: Self-pay | Admitting: Podiatry

## 2018-03-23 ENCOUNTER — Ambulatory Visit (INDEPENDENT_AMBULATORY_CARE_PROVIDER_SITE_OTHER): Payer: BLUE CROSS/BLUE SHIELD | Admitting: Rheumatology

## 2018-03-23 VITALS — BP 119/80 | HR 86 | Resp 17 | Ht 66.0 in | Wt 208.0 lb

## 2018-03-23 DIAGNOSIS — M79642 Pain in left hand: Secondary | ICD-10-CM | POA: Diagnosis not present

## 2018-03-23 DIAGNOSIS — R5383 Other fatigue: Secondary | ICD-10-CM

## 2018-03-23 DIAGNOSIS — M25551 Pain in right hip: Secondary | ICD-10-CM

## 2018-03-23 DIAGNOSIS — E282 Polycystic ovarian syndrome: Secondary | ICD-10-CM | POA: Diagnosis not present

## 2018-03-23 DIAGNOSIS — M51369 Other intervertebral disc degeneration, lumbar region without mention of lumbar back pain or lower extremity pain: Secondary | ICD-10-CM

## 2018-03-23 DIAGNOSIS — Z8659 Personal history of other mental and behavioral disorders: Secondary | ICD-10-CM

## 2018-03-23 DIAGNOSIS — R7689 Other specified abnormal immunological findings in serum: Secondary | ICD-10-CM

## 2018-03-23 DIAGNOSIS — M79671 Pain in right foot: Secondary | ICD-10-CM | POA: Diagnosis not present

## 2018-03-23 DIAGNOSIS — M79641 Pain in right hand: Secondary | ICD-10-CM

## 2018-03-23 DIAGNOSIS — M5030 Other cervical disc degeneration, unspecified cervical region: Secondary | ICD-10-CM | POA: Diagnosis not present

## 2018-03-23 DIAGNOSIS — R768 Other specified abnormal immunological findings in serum: Secondary | ICD-10-CM

## 2018-03-23 DIAGNOSIS — M79672 Pain in left foot: Secondary | ICD-10-CM | POA: Diagnosis not present

## 2018-03-23 DIAGNOSIS — E039 Hypothyroidism, unspecified: Secondary | ICD-10-CM | POA: Diagnosis not present

## 2018-03-23 DIAGNOSIS — M5136 Other intervertebral disc degeneration, lumbar region: Secondary | ICD-10-CM

## 2018-03-23 NOTE — Telephone Encounter (Signed)
Left message informing pt Dr. Corliss Skains was requesting the MRI for the right foot, and she was currently treating the right foot, the orders should come from her office.

## 2018-03-23 NOTE — Telephone Encounter (Signed)
I'm a pt of Dr. Beverlee Nims. I'm calling to let Dr. Charlsie Merles and the nurse know that I'm currently seeing a rheumatologist at [facility] for my right foot, hands, and hip. Her name is Dr. Pollyann Savoy and she is suggesting I have an MRI done of my right foot which is the foot Dr. Charlsie Merles is presently treating. I don't know if Dr. Charlsie Merles should be the one to order it or if she needs to because the x-rays are not showing anything. Also, I'm on a second round of prednisone and nothing is really helping. The pain and swelling are still there. Please call me back at [phone] and let me know who should go about ordering my MRI. Thank you. Bye bye.

## 2018-03-24 ENCOUNTER — Telehealth (INDEPENDENT_AMBULATORY_CARE_PROVIDER_SITE_OTHER): Payer: Self-pay | Admitting: *Deleted

## 2018-03-24 NOTE — Progress Notes (Signed)
WNL

## 2018-03-24 NOTE — Telephone Encounter (Signed)
appt scheduled for MRI on Wed May 17 at [facility] at 12pm with arrival 11:45am. Left message on vm to pt to return call for appt information

## 2018-03-26 LAB — URINALYSIS, ROUTINE W REFLEX MICROSCOPIC
Bilirubin Urine: NEGATIVE
GLUCOSE, UA: NEGATIVE
Hgb urine dipstick: NEGATIVE
LEUKOCYTES UA: NEGATIVE
Nitrite: NEGATIVE
PH: 5.5 (ref 5.0–8.0)
PROTEIN: NEGATIVE
Specific Gravity, Urine: 1.023 (ref 1.001–1.03)

## 2018-03-26 LAB — IRON,TIBC AND FERRITIN PANEL
%SAT: 13 % (calc) (ref 11–50)
Ferritin: 66 ng/mL (ref 10–232)
Iron: 54 ug/dL (ref 40–190)
TIBC: 401 mcg/dL (calc) (ref 250–450)

## 2018-03-26 LAB — CK: Total CK: 50 U/L (ref 29–143)

## 2018-03-26 LAB — SEDIMENTATION RATE: SED RATE: 6 mm/h (ref 0–20)

## 2018-03-26 LAB — ANGIOTENSIN CONVERTING ENZYME: ANGIOTENSIN-CONVERTING ENZYME: 24 U/L (ref 9–67)

## 2018-03-26 LAB — 14-3-3 ETA PROTEIN

## 2018-03-26 LAB — MAGNESIUM: Magnesium: 2.1 mg/dL (ref 1.5–2.5)

## 2018-03-26 LAB — HLA-B27 ANTIGEN: HLA-B27 Antigen: NEGATIVE

## 2018-03-26 LAB — URIC ACID: URIC ACID, SERUM: 6 mg/dL (ref 2.5–7.0)

## 2018-03-26 LAB — CYCLIC CITRUL PEPTIDE ANTIBODY, IGG: Cyclic Citrullin Peptide Ab: <16 UNITS

## 2018-03-28 ENCOUNTER — Encounter: Payer: Self-pay | Admitting: Nurse Practitioner

## 2018-03-28 ENCOUNTER — Other Ambulatory Visit: Payer: Self-pay | Admitting: Nurse Practitioner

## 2018-03-28 MED ORDER — CELECOXIB 200 MG PO CAPS: ORAL_CAPSULE | ORAL | 2 refills | Status: DC

## 2018-03-28 MED ORDER — HYDROCODONE-ACETAMINOPHEN 10-325 MG PO TABS: ORAL_TABLET | ORAL | 0 refills | Status: DC

## 2018-03-28 NOTE — Progress Notes (Signed)
PMP reviewed

## 2018-03-28 NOTE — Telephone Encounter (Signed)
Pt aware of appt scheduled wednesday

## 2018-03-30 ENCOUNTER — Ambulatory Visit (HOSPITAL_COMMUNITY)
Admission: RE | Admit: 2018-03-30 | Discharge: 2018-03-30 | Disposition: A | Discharge status: Home / Self Care | Payer: BLUE CROSS/BLUE SHIELD | Source: Ambulatory Visit | Attending: Rheumatology | Admitting: Rheumatology

## 2018-03-30 DIAGNOSIS — M19071 Primary osteoarthritis, right ankle and foot: Secondary | ICD-10-CM | POA: Diagnosis not present

## 2018-03-30 DIAGNOSIS — M79672 Pain in left foot: Secondary | ICD-10-CM

## 2018-03-30 DIAGNOSIS — M79671 Pain in right foot: Secondary | ICD-10-CM | POA: Diagnosis not present

## 2018-03-30 DIAGNOSIS — M25871 Other specified joint disorders, right ankle and foot: Secondary | ICD-10-CM | POA: Insufficient documentation

## 2018-03-30 NOTE — Progress Notes (Signed)
I discussed the MRI results with the patient.  She will discuss these findings with Dr. Charlsie Merles.  She is planning to go for a second opinion to do for her foot.  I offered a referral to rheumatology at a tertiary care center as well.  She would like to hold off.  I discussed lab results with her which were all within normal limits.

## 2018-04-08 ENCOUNTER — Ambulatory Visit: Payer: BLUE CROSS/BLUE SHIELD | Admitting: Podiatry

## 2018-04-15 NOTE — Progress Notes (Deleted)
Visit Diagnoses: Pain in both hands - RF -, Sed rate WNL.  I reviewed her x-rays done by Dr. with feel of her left hand which showed second and third MCP narrowing.  This could be seen with inflammatory arthritis or crystal induced arthropathy.  She also gives history of injury to her left hand once.  He brought some pictures of her left hand which showed swelling of her left second and third MCP joints.  I will obtain following labs today and also will schedule ultrasound of her left hand.  She is currently on prednisone taper.  She states prior to that she had more swelling.  Pain in both feet -she has been having swelling in her right foot and also some plantar fasciitis.  She has tenderness on palpation of her MTPs.  I will obtain following labs.  She is also concerned about the etiology of her left foot swelling and would like MRI.  I will schedule MRI for left foot.  Plan: Sedimentation rate, Angiotensin converting enzyme, AID-K22 antigen, Cyclic citrul peptide antibody, IgG, 14-3-3 eta Protein, Uric acid, Iron, TIBC and Ferritin Panel, HLA-B27 antigen  Pain of right hip joint-I reviewed MRI of her left hip joint with her today.  Positive ANA (antinuclear antibody) - 1:80 Homogeneous.  I believe ANA is low titer and nonspecific she does not have any clinical features of lupus or related diseases. Mar 23, 2018 iron studies, CK, magnesium, ESR, ACE, HLA-B27, anti-CCP, 14 3 3  eta, uric acid all within normal limits

## 2018-04-20 ENCOUNTER — Other Ambulatory Visit: Payer: BLUE CROSS/BLUE SHIELD | Admitting: Rheumatology

## 2018-04-25 DIAGNOSIS — M5136 Other intervertebral disc degeneration, lumbar region: Secondary | ICD-10-CM | POA: Insufficient documentation

## 2018-04-25 DIAGNOSIS — M19071 Primary osteoarthritis, right ankle and foot: Secondary | ICD-10-CM | POA: Insufficient documentation

## 2018-04-25 DIAGNOSIS — M51369 Other intervertebral disc degeneration, lumbar region without mention of lumbar back pain or lower extremity pain: Secondary | ICD-10-CM | POA: Insufficient documentation

## 2018-04-25 DIAGNOSIS — R7689 Other specified abnormal immunological findings in serum: Secondary | ICD-10-CM | POA: Insufficient documentation

## 2018-04-25 DIAGNOSIS — M25551 Pain in right hip: Secondary | ICD-10-CM | POA: Insufficient documentation

## 2018-04-25 DIAGNOSIS — R768 Other specified abnormal immunological findings in serum: Secondary | ICD-10-CM | POA: Insufficient documentation

## 2018-04-25 DIAGNOSIS — M19072 Primary osteoarthritis, left ankle and foot: Secondary | ICD-10-CM

## 2018-04-25 DIAGNOSIS — M5030 Other cervical disc degeneration, unspecified cervical region: Secondary | ICD-10-CM | POA: Insufficient documentation

## 2018-04-25 NOTE — Progress Notes (Deleted)
Office Visit Note  Patient: Bonnie [name]             Date of Birth: February 08, 1971           MRN: [id number]             PCP: Kathyrn Drown, MD Referring: Kathyrn Drown, MD Visit Date: 05/04/2018 Occupation: _0 @    Subjective:  No chief complaint on file.   History of Present Illness: Bonnie [name] is a 47 y.o. female ***   Activities of Daily Living:  Patient reports morning stiffness for *** {minute/hour:19697}.   Patient {ACTIONS;DENIES/REPORTS:21021675::"Denies"} nocturnal pain.  Difficulty dressing/grooming: {ACTIONS;DENIES/REPORTS:21021675::"Denies"} Difficulty climbing stairs: {ACTIONS;DENIES/REPORTS:21021675::"Denies"} Difficulty getting out of chair: {ACTIONS;DENIES/REPORTS:21021675::"Denies"} Difficulty using hands for taps, buttons, cutlery, and/or writing: {ACTIONS;DENIES/REPORTS:21021675::"Denies"}   No Rheumatology ROS completed.   PMFS History:  Patient Active Problem List   Diagnosis Date Noted  . LLQ pain 11/19/2016  . Bloating 11/19/2016  . Big thyroid 03/30/2014  . Dysmetabolic syndrome 12/87/8676  . 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 Morbid obesity ([place]) 10/18/2013  . Hypothyroidism 10/18/2013  . PCOS (polycystic ovarian syndrome) 10/18/2013  . Abdominal pain 09/22/2012  . Rectal bleeding 09/22/2012    Past Medical History:  Diagnosis Date  . Depression   . Dysthymia 1999  . Hypothyroidism   . PCOS (polycystic ovarian syndrome) 2000  . Prediabetes     Family History  Problem Relation Age of Onset  . Diabetes Other   . Hypertension Father   . Diabetes Father   . Cancer Mother        adrenal, lung, pituitary, lymph nodes, ?primary  . Diabetes Brother   . Healthy Daughter   . Colon cancer Neg Hx    Past Surgical History:  Procedure Laterality Date  . CARPAL TUNNEL RELEASE Right 10/17/2015   DR. Whitfield  . CARPAL TUNNEL RELEASE Left   . CESAREAN SECTION    . COSMETIC SURGERY    . DILATION AND CURETTAGE OF UTERUS    .  ENDOMETRIAL ABLATION    . FACIAL COSMETIC SURGERY     oralmaxillofacial surgery, broke jaws   . FOOT SURGERY    . NECK SURGERY  May 2006  . WISDOM TOOTH EXTRACTION     Social History   Social History Narrative  . Not on file     Objective: Vital Signs: There were no vitals taken for this visit.   Physical Exam   Musculoskeletal Exam: ***  CDAI Exam: No CDAI exam completed.    Investigation: No additional findings. Mar 23, 2018 UA negative iron studies normal, CK normal, magnesium normal, ESR 6, uric acid 6.0, anti-CCP negative, _1 at a negative, HLA-B27 negative  Imaging: Mr Foot Right Wo Contrast  Result Date: 03/30/2018 CLINICAL DATA:  Right foot pain, swelling, sharp shooting pain occasionally. EXAM: MRI OF THE RIGHT FOREFOOT WITHOUT CONTRAST TECHNIQUE: Multiplanar, multisequence MR imaging of the right forefoot was performed. No intravenous contrast was administered. COMPARISON:  None. FINDINGS: Bones/Joint/Cartilage Surgical screw in the second metatarsal head. Marrow edema in the second and third metatarsals with surrounding soft tissue edema most consistent with stress reaction without a fracture. Otherwise, no acute fracture or dislocation. Normal alignment. No joint effusion. Moderate joint space narrowing with partial thickness cartilage loss. Marrow edema in the medial hallux sesamoid and adjacent first metatarsal head consistent with sesamoiditis. Ligaments Collateral ligaments are intact.  Lisfranc ligament is intact. Muscles and Tendons Flexor, peroneal and extensor compartment tendons are intact. Soft tissue  No fluid collection or hematoma.  No soft tissue mass. IMPRESSION: 1. Mild stress reaction of the second and third metatarsal neck. 2. Moderate osteoarthritis of the first MTP joint. 3. Medial hallux sesamoiditis. Electronically Signed   By: Kathreen Devoid   On: 03/30/2018 15:34    Speciality Comments: No specialty comments available.    Procedures:  No  procedures performed Allergies: Augmentin [amoxicillin-pot clavulanate]; Diclofenac; Levaquin [levofloxacin in d5w]; and Tramadol   Assessment / Plan:     Visit Diagnoses: No diagnosis found.    Orders: No orders of the defined types were placed in this encounter.  No orders of the defined types were placed in this encounter.   Face-to-face time spent with patient was *** minutes. 50% of time was spent in counseling and coordination of care.  Follow-Up Instructions: No follow-ups on file.   Bo Merino, MD  Note - This record has been created using Editor, commissioning.  Chart creation errors have been sought, but may not always  have been located. Such creation errors do not reflect on  the standard of medical care.

## 2018-05-02 ENCOUNTER — Encounter: Payer: Self-pay | Admitting: Nurse Practitioner

## 2018-05-02 DIAGNOSIS — M21622 Bunionette of left foot: Secondary | ICD-10-CM | POA: Diagnosis not present

## 2018-05-02 DIAGNOSIS — G5762 Lesion of plantar nerve, left lower limb: Secondary | ICD-10-CM | POA: Diagnosis not present

## 2018-05-02 DIAGNOSIS — M71572 Other bursitis, not elsewhere classified, left ankle and foot: Secondary | ICD-10-CM | POA: Diagnosis not present

## 2018-05-02 DIAGNOSIS — M84374A Stress fracture, right foot, initial encounter for fracture: Secondary | ICD-10-CM | POA: Diagnosis not present

## 2018-05-04 ENCOUNTER — Other Ambulatory Visit: Payer: Self-pay | Admitting: Nurse Practitioner

## 2018-05-04 ENCOUNTER — Ambulatory Visit (INDEPENDENT_AMBULATORY_CARE_PROVIDER_SITE_OTHER): Payer: BLUE CROSS/BLUE SHIELD | Admitting: Orthopaedic Surgery

## 2018-05-04 ENCOUNTER — Ambulatory Visit: Payer: BLUE CROSS/BLUE SHIELD | Admitting: Rheumatology

## 2018-05-04 MED ORDER — HYDROCODONE-ACETAMINOPHEN 10-325 MG PO TABS: ORAL_TABLET | ORAL | 0 refills | Status: DC

## 2018-05-16 ENCOUNTER — Encounter: Payer: Self-pay | Admitting: Nurse Practitioner

## 2018-05-16 ENCOUNTER — Ambulatory Visit (INDEPENDENT_AMBULATORY_CARE_PROVIDER_SITE_OTHER): Payer: BLUE CROSS/BLUE SHIELD | Admitting: Nurse Practitioner

## 2018-05-16 VITALS — BP 138/88 | Ht 66.0 in | Wt 205.0 lb

## 2018-05-16 DIAGNOSIS — Z79891 Long term (current) use of opiate analgesic: Secondary | ICD-10-CM | POA: Diagnosis not present

## 2018-05-16 DIAGNOSIS — M5136 Other intervertebral disc degeneration, lumbar region: Secondary | ICD-10-CM | POA: Diagnosis not present

## 2018-05-16 DIAGNOSIS — M19071 Primary osteoarthritis, right ankle and foot: Secondary | ICD-10-CM

## 2018-05-16 DIAGNOSIS — Z780 Asymptomatic menopausal state: Secondary | ICD-10-CM | POA: Diagnosis not present

## 2018-05-16 DIAGNOSIS — M19072 Primary osteoarthritis, left ankle and foot: Secondary | ICD-10-CM | POA: Diagnosis not present

## 2018-05-16 NOTE — Progress Notes (Signed)
Subjective: This patient was seen today for chronic pain  The medication list was reviewed and updated.   -Compliance with medication: up until now; arthritis pain BC powder  - Number patient states they take daily: twice a day   -when was the last dose patient took? Last Thursday  The patient was advised the importance of maintaining medication and not using illegal substances with these.  Here for refills and follow up  The patient was educated that we can provide 3 monthly scripts for their medication, it is their responsibility to follow the instructions.  Side effects or complications from medications: none   Patient is aware that pain medications are meant to minimize the severity of the pain to allow their pain levels to improve to allow for better function. They are aware of that pain medications cannot totally remove their pain.  Due for UDT ( at least once per year) : today States she plans to stop her pain medicine. Her back pain has been stable. Having more trouble with chronic right foot pain due to surgery which has caused pain in her right hip. Also has osteoarthritis changes especially in fingers, more on the left. Has had edema and erythema in the left forefinger. Sees her orthopedic specialist for this. Has had several injections. May need surgery if no improvement. Saw rheumatologist on 03/23/18. All of her tests were negative. States she was told by podiatrist that she has several small old fractures in the feet, mostly on right foot near her surgical site.   Objective: NAD. Alert, active. Lungs clear. Heart RRR. Significant nodule with mild edema and erythema noted at the MTP joint forefinger left hand. Tender to palpation. Multiple smaller nodularity noted in the fingers of both hands. Linear scar tissue noted dorsal area right foot at base of toes.   Assessment:  Problem List Items Addressed This Visit      Musculoskeletal and Integument   DDD (degenerative disc  disease), lumbar   Primary osteoarthritis of both feet     Other   Encounter for long-term opiate analgesic use - Primary   Relevant Orders   ToxASSURE Select 13 (MW), Urine    Other Visit Diagnoses    Postmenopausal       Relevant Orders   DG Bone Density     Plan: use OTC NSAIDs carefully. DC if any stomach upset. Take with food. Do not take with Celebrex. Patient has tried to cut back on pain medicine recently without success. Call back in 2 weeks if she needs another refill (last Rx written on 6/19). Encouraged continued weight loss and activity as tolerated. Continue follow up with orthopedic specialist and podiatry.  DEXA ordered due to arthritis issues and reported history of fractures in the feet. Recheck here as needed for pain management.  25 minutes was spent with the patient.  This statement verifies that 25 minutes was indeed spent with the patient.  More than 50% of this visit-total duration of the visit-was spent in counseling and coordination of care. The issues that the patient came in for today as reflected in the diagnosis (s) please refer to documentation for further details.

## 2018-05-17 ENCOUNTER — Encounter: Payer: Self-pay | Admitting: Nurse Practitioner

## 2018-05-17 DIAGNOSIS — Z79891 Long term (current) use of opiate analgesic: Secondary | ICD-10-CM | POA: Insufficient documentation

## 2018-05-18 DIAGNOSIS — Z79891 Long term (current) use of opiate analgesic: Secondary | ICD-10-CM | POA: Diagnosis not present

## 2018-05-18 DIAGNOSIS — F902 Attention-deficit hyperactivity disorder, combined type: Secondary | ICD-10-CM | POA: Diagnosis not present

## 2018-05-18 DIAGNOSIS — F331 Major depressive disorder, recurrent, moderate: Secondary | ICD-10-CM | POA: Diagnosis not present

## 2018-05-21 LAB — SPECIMEN STATUS REPORT

## 2018-05-21 LAB — TOXASSURE SELECT 13 (MW), URINE

## 2018-05-25 ENCOUNTER — Encounter: Payer: Self-pay | Admitting: Nurse Practitioner

## 2018-05-25 ENCOUNTER — Ambulatory Visit (HOSPITAL_COMMUNITY): Payer: BLUE CROSS/BLUE SHIELD

## 2018-05-27 ENCOUNTER — Other Ambulatory Visit: Payer: Self-pay | Admitting: Nurse Practitioner

## 2018-05-27 ENCOUNTER — Encounter: Payer: Self-pay | Admitting: Nurse Practitioner

## 2018-05-27 MED ORDER — HYDROCODONE-ACETAMINOPHEN 10-325 MG PO TABS: ORAL_TABLET | ORAL | 0 refills | Status: DC

## 2018-06-02 ENCOUNTER — Telehealth: Payer: Self-pay | Admitting: Nurse Practitioner

## 2018-06-02 NOTE — Telephone Encounter (Signed)
Pt had bone density scan ordered; but not scheduled. Attempted to contact patient to see what time and day suits her. Left numerous voicemails, but no return call at this time. Also sent MyChart message.

## 2018-06-02 NOTE — Telephone Encounter (Signed)
Noted  

## 2018-06-06 ENCOUNTER — Other Ambulatory Visit: Payer: Self-pay | Admitting: *Deleted

## 2018-06-06 MED ORDER — SYNTHROID 150 MCG PO TABS: ORAL_TABLET | ORAL | 5 refills | Status: DC

## 2018-06-29 ENCOUNTER — Encounter: Payer: Self-pay | Admitting: Family Medicine

## 2018-06-30 ENCOUNTER — Other Ambulatory Visit: Payer: Self-pay | Admitting: Family Medicine

## 2018-06-30 ENCOUNTER — Encounter: Payer: Self-pay | Admitting: Family Medicine

## 2018-06-30 ENCOUNTER — Telehealth: Payer: Self-pay | Admitting: Family Medicine

## 2018-06-30 MED ORDER — HYDROCODONE-ACETAMINOPHEN 10-325 MG PO TABS: ORAL_TABLET | ORAL | 0 refills | Status: AC

## 2018-06-30 NOTE — Telephone Encounter (Signed)
The patient requested prescription on her pain medicine I reviewed over her chart [facility] the [facility] We went ahead and send in her next prescription which can be filled on 18 August We will send in another one in 30 days when she requested She will do a pain management follow-up visit in early October We will also be sending her a letter recommending that she talk with her mental health provider about tapering down Klonopin to lessen the risk of accidental overdose  This message is FYI-please see my chart message

## 2018-06-30 NOTE — Telephone Encounter (Signed)
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

## 2018-06-30 NOTE — Telephone Encounter (Signed)
Already addressed this with the pt via My chart Message.

## 2018-07-05 ENCOUNTER — Other Ambulatory Visit (HOSPITAL_COMMUNITY): Payer: BLUE CROSS/BLUE SHIELD

## 2018-08-01 ENCOUNTER — Encounter: Payer: Self-pay | Admitting: Family Medicine

## 2018-08-02 NOTE — Telephone Encounter (Signed)
Last seen for pain management 05/16/18. No upcoming appt scheduled

## 2018-08-03 ENCOUNTER — Other Ambulatory Visit: Payer: Self-pay | Admitting: Family Medicine

## 2018-08-03 MED ORDER — HYDROCODONE-ACETAMINOPHEN 10-325 MG PO TABS: ORAL_TABLET | ORAL | 0 refills | Status: DC

## 2018-08-04 ENCOUNTER — Encounter: Payer: Self-pay | Admitting: Family Medicine

## 2018-08-04 ENCOUNTER — Ambulatory Visit (HOSPITAL_COMMUNITY)
Admission: RE | Admit: 2018-08-04 | Discharge: 2018-08-04 | Disposition: A | Discharge status: Home / Self Care | Payer: BLUE CROSS/BLUE SHIELD | Source: Ambulatory Visit | Attending: Nurse Practitioner | Admitting: Nurse Practitioner

## 2018-08-04 ENCOUNTER — Encounter (HOSPITAL_COMMUNITY): Payer: Self-pay | Admitting: Radiology

## 2018-08-04 DIAGNOSIS — Z780 Asymptomatic menopausal state: Secondary | ICD-10-CM | POA: Diagnosis not present

## 2018-08-16 DIAGNOSIS — F331 Major depressive disorder, recurrent, moderate: Secondary | ICD-10-CM | POA: Diagnosis not present

## 2018-08-16 DIAGNOSIS — F902 Attention-deficit hyperactivity disorder, combined type: Secondary | ICD-10-CM | POA: Diagnosis not present

## 2018-08-17 ENCOUNTER — Encounter (INDEPENDENT_AMBULATORY_CARE_PROVIDER_SITE_OTHER): Payer: Self-pay | Admitting: Orthopaedic Surgery

## 2018-08-17 ENCOUNTER — Ambulatory Visit (INDEPENDENT_AMBULATORY_CARE_PROVIDER_SITE_OTHER): Payer: BLUE CROSS/BLUE SHIELD | Admitting: Orthopaedic Surgery

## 2018-08-17 VITALS — Ht 66.0 in | Wt 205.0 lb

## 2018-08-17 DIAGNOSIS — M79642 Pain in left hand: Secondary | ICD-10-CM | POA: Diagnosis not present

## 2018-08-17 DIAGNOSIS — M79645 Pain in left finger(s): Secondary | ICD-10-CM

## 2018-08-17 MED ORDER — METHYLPREDNISOLONE ACETATE 40 MG/ML IJ SUSP
20.0000 mg | INTRAMUSCULAR | Status: AC | PRN
  Administered 2018-08-17: 20 mg via INTRA_ARTICULAR

## 2018-08-17 MED ORDER — LIDOCAINE HCL 1 % IJ SOLN
0.5000 mL | INTRAMUSCULAR | Status: AC | PRN
  Administered 2018-08-17: .5 mL

## 2018-08-17 NOTE — Progress Notes (Signed)
Office Visit Note   Patient: OZELLE BRUBACHER           Date of Birth: September 05, 1971           MRN: [id number] Visit Date: 08/17/2018              Requested by: Babs Sciara, MD [address] [place], [place] [address] PCP: Babs Sciara, MD   Assessment & Plan: Visit Diagnoses:  1. Pain of left hand     Plan: Arthritic changes metacarpal phalangeal joint left index finger and metacarpal carpal joint left thumb.  Long discussion regarding diagnosis and treatment options.  Will inject each joint with cortisone.  We will also check with Dr. Corliss Skains disposition from a rheumatologic standpoint office as needed  Follow-Up Instructions: Return if symptoms worsen or fail to improve.   Orders:  Orders Placed This Encounter  Procedures  . Small Joint Inj: L thumb MCP  . Small Joint Inj: L index MCP   No orders of the defined types were placed in this encounter.     Procedures: Small Joint Inj: L thumb MCP on 08/17/2018 2:12 PM Details: 27 G needle, dorsal approach  Spinal Needle: No  Medications: 0.5 mL lidocaine 1 %; 20 mg methylPREDNISolone acetate 40 MG/ML  Small Joint Inj: L index MCP on 08/17/2018 2:13 PM Details: 27 G needle, dorsal approach  Spinal Needle: No  Medications: 0.5 mL lidocaine 1 %; 20 mg methylPREDNISolone acetate 40 MG/ML      Clinical Data: No additional findings.   Subjective: No chief complaint on file. Mrs. Wagler is 47 years old and is been seen on a number of occasions for various problems.  She has had bilateral carpal tunnel releases with excellent results.  I have seen her in the past for cyst injection at the base of the left thumb.  She is also had injection of the left index metacarpal phalangeal joint.  She has seen Dr. Corliss Skains for negative labs studies.  Not sure of the final disposition.  She is had a number of problems with both of her feet and has been followed by the podiatrist.  She has seen her primary care  physician recently with a bone density study negative for osteoporosis or osteopenia.  Her biggest problem is with her left hand with recurrent swelling and pain at the base of the thumb and of the metacarpal phalangeal joint of the index finger. Had prior films of her left hand there was some degenerative changes at the base of the thumb with subluxation and osteophyte formation.  No abnormalities were identified at the index metacarpal phalangeal joint  HPI  Review of Systems  Constitutional: Negative for fatigue and fever.  HENT: Negative for ear pain.   Eyes: Negative for pain.  Respiratory: Negative for cough and shortness of breath.   Cardiovascular: Negative for leg swelling.  Gastrointestinal: Negative for constipation and diarrhea.  Genitourinary: Negative for difficulty urinating.  Musculoskeletal: Positive for back pain. Negative for neck pain.  Skin: Negative for rash.  Allergic/Immunologic: Negative for food allergies.  Neurological: Positive for weakness and numbness.  Hematological: Does not bruise/bleed easily.  Psychiatric/Behavioral: Positive for sleep disturbance.     Objective: Vital Signs: Ht 5\' 6"  (1.676 m)   Wt 205 lb (93 kg)   LMP 05/10/2013   BMI 33.09 kg/m   Physical Exam  Ortho Exam awake alert and oriented x3.  Comfortable sitting.  Obvious swelling at the metacarpal phalangeal joint  of the left index finger.  She is able to make a full fist and release but does have some pain.  No deformity at the base of the left thumb.  Skin intact.  Some weakened grip.  Able to touch her thumb to little finger.  Carpal tunnel incisions of healed without any problem.  Specialty Comments:  No specialty comments available.  Imaging: No results found.   PMFS History: Patient Active Problem List   Diagnosis Date Noted  . Encounter for long-term opiate analgesic use 05/17/2018  . DDD (degenerative disc disease), cervical 04/25/2018  . DDD (degenerative disc  disease), lumbar 04/25/2018  . Positive ANA (antinuclear antibody) 04/25/2018  . Pain of right hip joint 04/25/2018  . Primary osteoarthritis of both feet 04/25/2018  . LLQ pain 11/19/2016  . Bloating 11/19/2016  . Big thyroid 03/30/2014  . Dysmetabolic syndrome 03/30/2014  . 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 Morbid obesity (HCC) 10/18/2013  . Hypothyroidism 10/18/2013  . PCOS (polycystic ovarian syndrome) 10/18/2013  . Abdominal pain 09/22/2012  . Rectal bleeding 09/22/2012   Past Medical History:  Diagnosis Date  . Depression   . Dysthymia 1999  . Hypothyroidism   . PCOS (polycystic ovarian syndrome) 2000  . Prediabetes     Family History  Problem Relation Age of Onset  . Diabetes Other   . Hypertension Father   . Diabetes Father   . Cancer Mother        adrenal, lung, pituitary, lymph nodes, ?primary  . Diabetes Brother   . Healthy Daughter   . Colon cancer Neg Hx     Past Surgical History:  Procedure Laterality Date  . CARPAL TUNNEL RELEASE Right 10/17/2015   DR. Khristina Janota  . CARPAL TUNNEL RELEASE Left   . CESAREAN SECTION    . COSMETIC SURGERY    . DILATION AND CURETTAGE OF UTERUS    . ENDOMETRIAL ABLATION    . FACIAL COSMETIC SURGERY     oralmaxillofacial surgery, broke jaws   . FOOT SURGERY    . NECK SURGERY  May 2006  . WISDOM TOOTH EXTRACTION     Social History   Occupational History  . Occupation: stay at home  Tobacco Use  . Smoking status: Never Smoker  . Smokeless tobacco: Never Used  Substance and Sexual Activity  . Alcohol use: No  . Drug use: No  . Sexual activity: Not on file

## 2018-08-24 ENCOUNTER — Ambulatory Visit: Payer: BLUE CROSS/BLUE SHIELD | Admitting: Family Medicine

## 2018-09-14 ENCOUNTER — Other Ambulatory Visit: Payer: BLUE CROSS/BLUE SHIELD | Admitting: Rheumatology

## 2018-10-24 ENCOUNTER — Telehealth: Payer: Self-pay | Admitting: Family Medicine

## 2018-10-24 MED ORDER — CELECOXIB 200 MG PO CAPS: ORAL_CAPSULE | ORAL | 3 refills | Status: DC

## 2018-10-24 NOTE — Telephone Encounter (Signed)
May have this and 3 refills 

## 2018-10-24 NOTE — Telephone Encounter (Signed)
Fax from pharmacy requesting refill on Celecoxib 200 mg capsule. Take one capsule by mouth daily as needed for pain.

## 2018-10-24 NOTE — Telephone Encounter (Addendum)
Prescription sent electronically to pharmacy. 

## 2018-10-24 NOTE — Addendum Note (Signed)
Addended by: Margaretha SheffieldBROWN, Pasco Marchitto S on: 10/24/2018 04:17 PM   Modules accepted: Orders

## 2018-11-02 IMAGING — MR MR FOOT*R* W/O CM
4 of 5 series · 13 of 40 positions shown · non-contrast
Comparison: None.

CLINICAL DATA: Right foot pain, swelling, sharp shooting pain
occasionally.

EXAM:
MRI OF THE RIGHT FOREFOOT WITHOUT CONTRAST
TECHNIQUE: Multiplanar, multisequence MR imaging of the right forefoot was
performed. No intravenous contrast was administered.

[Series 10: t2fs axial · coronal · 3.0mm · 0.21mm/px · 3 of 40 slices shown]
[im 4/40]
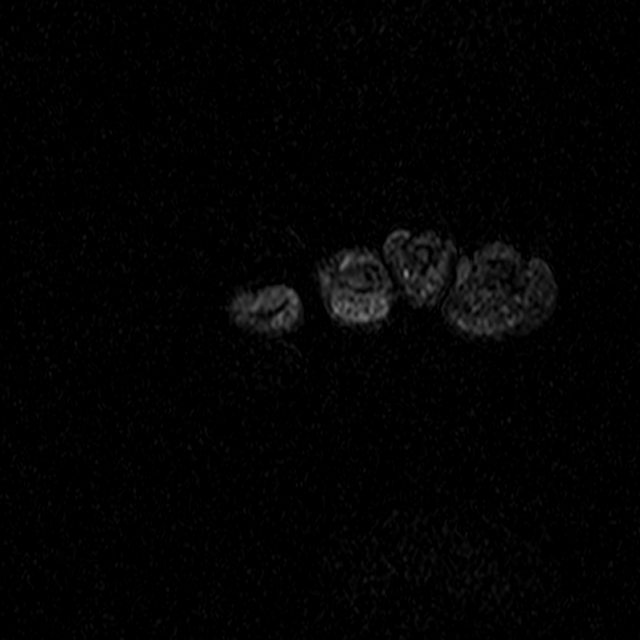
[im 20/40]
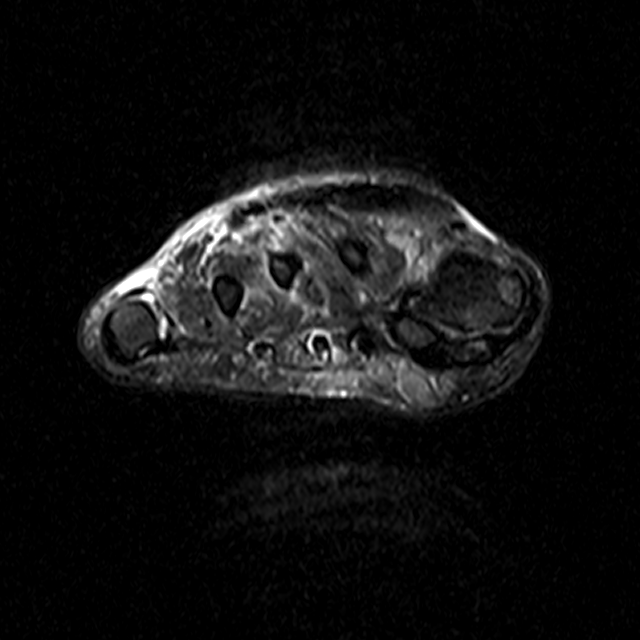
[im 36/40]
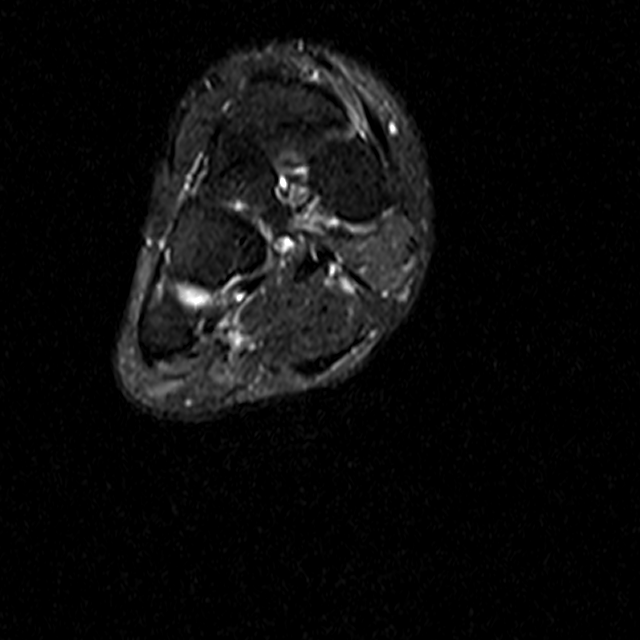

[Series 11: T1 · coronal · 3.0mm · 0.21mm/px · 4 of 40 slices shown (1 of 2)]
[im 1/40]
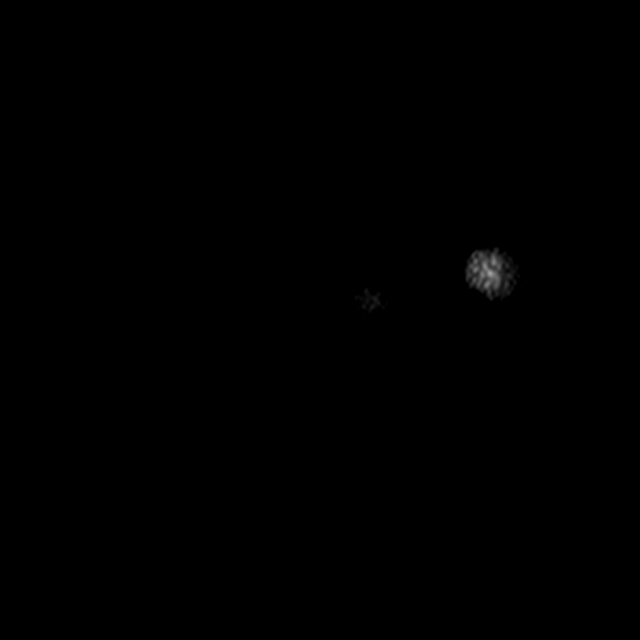
[im 4/40]
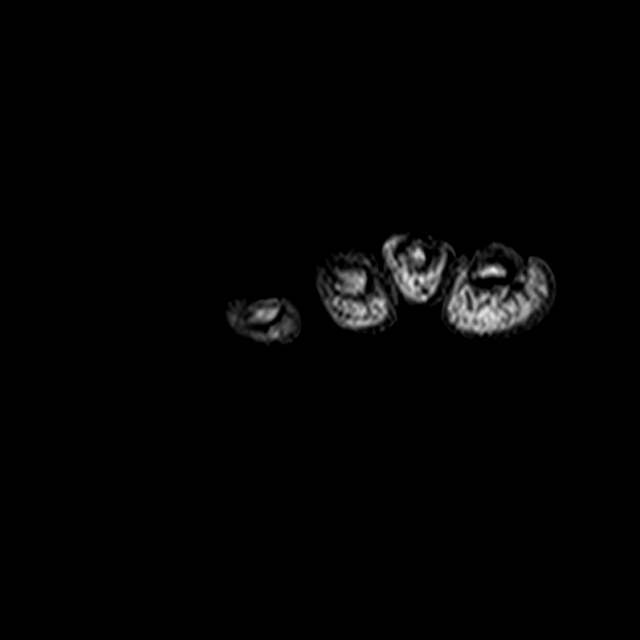
[im 20/40]
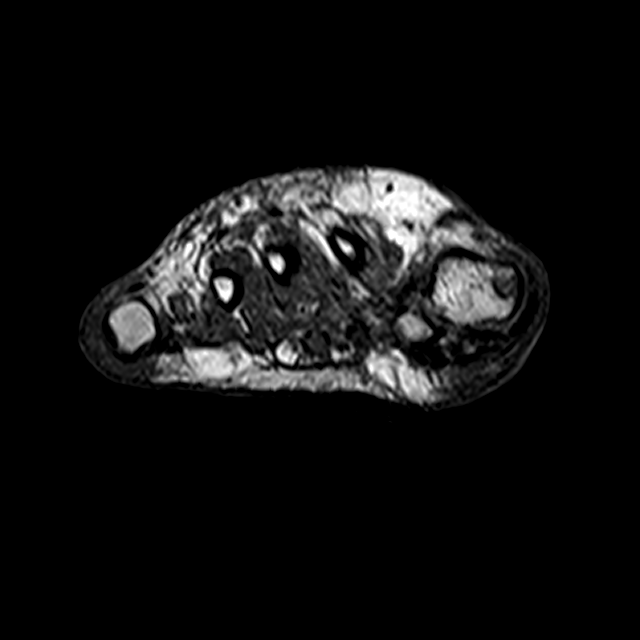
[im 36/40]
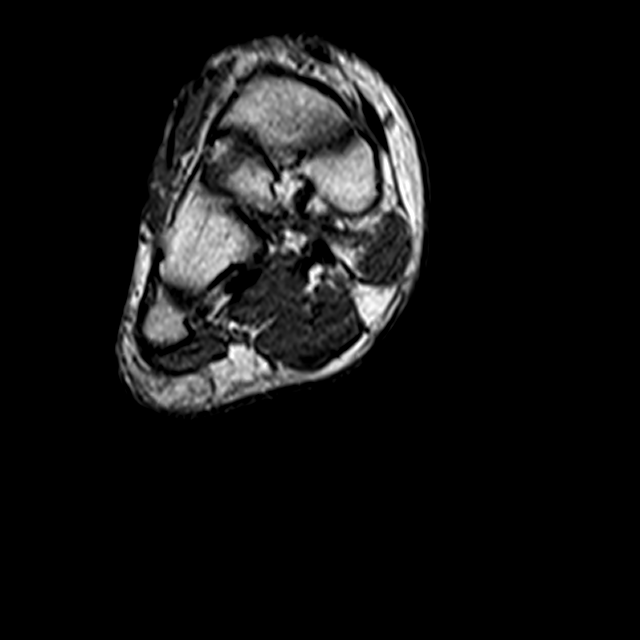

[Series 12: ir sagital · sagittal · 3.0mm · 0.27mm/px · 3 of 29 slices shown]
[im 5/29]
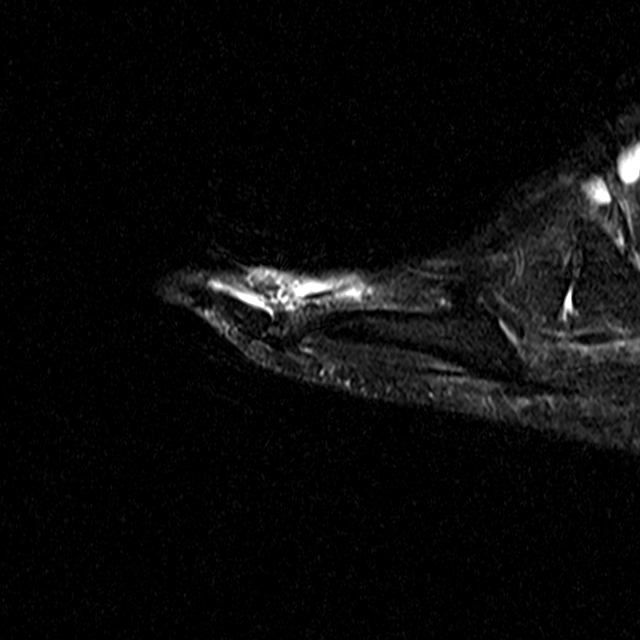
[im 17/29]
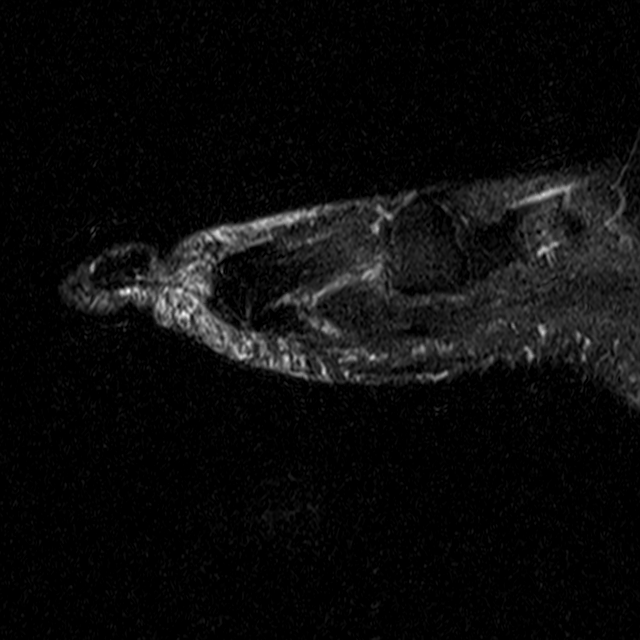
[im 25/29]
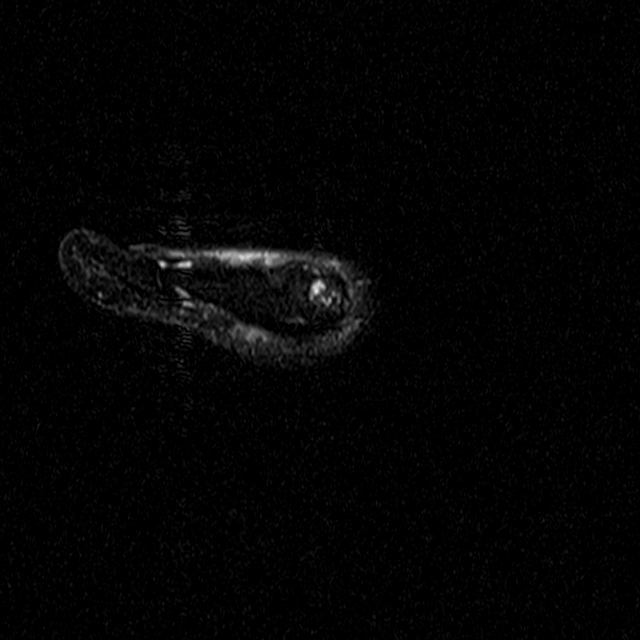

[Series 13: T1 · oblique · 3.0mm · 0.27mm/px · 3 of 20 slices shown (2 of 2)]
[im 1/20]
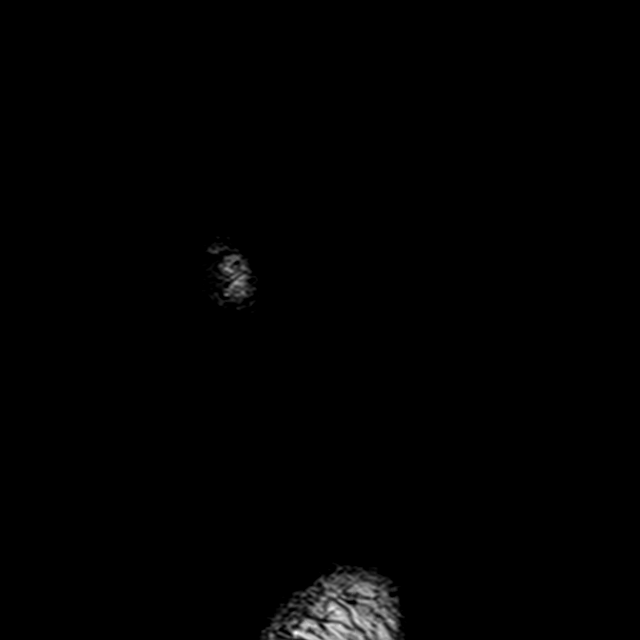
[im 10/20]
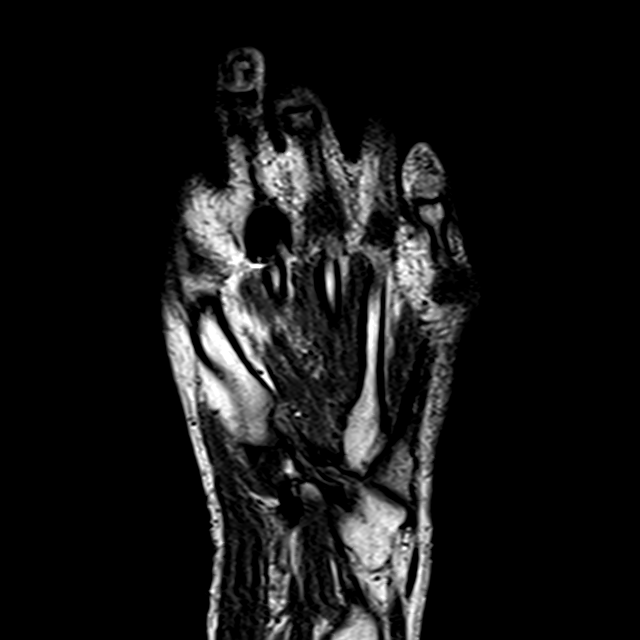
[im 20/20]
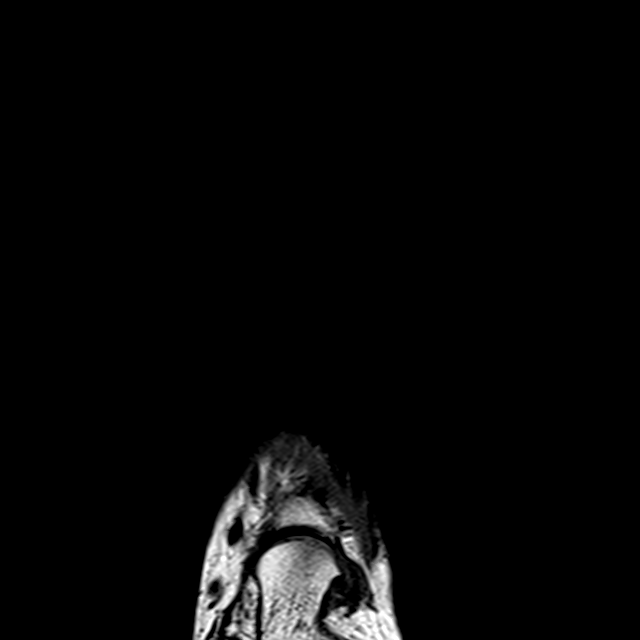

[13 of 40 positions shown; findings below may reference images not displayed]

FINDINGS: Bones/Joint/Cartilage

Surgical screw in the second metatarsal head. Marrow edema in the
second and third metatarsals with surrounding soft tissue edema most
consistent with stress reaction without a fracture. Otherwise, no
acute fracture or dislocation. Normal alignment. No joint effusion.
Moderate joint space narrowing with partial thickness cartilage
loss.

Marrow edema in the medial hallux sesamoid and adjacent first
metatarsal head consistent with sesamoiditis.

Ligaments

Collateral ligaments are intact.  Lisfranc ligament is intact.

Muscles and Tendons
Flexor, peroneal and extensor compartment tendons are intact.

Soft tissue
No fluid collection or hematoma.  No soft tissue mass.
IMPRESSION: 1. Mild stress reaction of the second and third metatarsal neck.
2. Moderate osteoarthritis of the first MTP joint.
3. Medial hallux sesamoiditis.

## 2018-12-10 ENCOUNTER — Other Ambulatory Visit: Payer: Self-pay | Admitting: Family Medicine

## 2019-01-02 ENCOUNTER — Encounter: Payer: Self-pay | Admitting: Family Medicine

## 2019-01-02 ENCOUNTER — Ambulatory Visit: Payer: BLUE CROSS/BLUE SHIELD | Admitting: Family Medicine

## 2019-01-02 VITALS — Temp 97.5°F | Wt 207.2 lb

## 2019-01-02 DIAGNOSIS — M79671 Pain in right foot: Secondary | ICD-10-CM

## 2019-01-02 DIAGNOSIS — E161 Other hypoglycemia: Secondary | ICD-10-CM

## 2019-01-02 DIAGNOSIS — M792 Neuralgia and neuritis, unspecified: Secondary | ICD-10-CM | POA: Diagnosis not present

## 2019-01-02 DIAGNOSIS — E039 Hypothyroidism, unspecified: Secondary | ICD-10-CM | POA: Diagnosis not present

## 2019-01-02 MED ORDER — HYDROCODONE-ACETAMINOPHEN 10-325 MG PO TABS: ORAL_TABLET | ORAL | 0 refills | Status: DC

## 2019-01-02 MED ORDER — CELECOXIB 200 MG PO CAPS: ORAL_CAPSULE | ORAL | 3 refills | Status: DC

## 2019-01-02 NOTE — Progress Notes (Signed)
Subjective:    Patient ID: Bonnie [name], female    DOB: 09/13/1971, 48 y.o.   MRN: [id number]  Foot Pain  This is a new problem. Pertinent negatives include no abdominal pain, chest pain, congestion, coughing, fatigue or weakness. Associated symptoms comments: Swelling, painful but numb. Broken 3rd and 4th toes. Pt had x ray done and x ray showed that. Pt states the the backside of her toes are numb and painful. Treatments tried: Arthritis [facility]  The treatment provided mild relief.   Pt states she would like to not take pain pills if she can help it but she would like to have a discussion regarding what other options  Patient has a very difficult case she has had previous surgery she is having ongoing feeling of her foot being swollen at times painful with pinpricks needle pricks numbness tingling and changes in coloration at times  She denies having this problem before she started to have an orthopedic foot problems   Review of Systems  Constitutional: Negative for activity change, appetite change and fatigue.  HENT: Negative for congestion and rhinorrhea.   Respiratory: Negative for cough and shortness of breath.   Cardiovascular: Negative for chest pain and leg swelling.  Gastrointestinal: Negative for abdominal pain and diarrhea.  Endocrine: Negative for polydipsia and polyphagia.  Skin: Negative for color change.  Neurological: Negative for dizziness and weakness.  Psychiatric/Behavioral: Negative for behavioral problems and confusion.       Objective:   Physical Exam Vitals signs reviewed.  Constitutional:      General: She is not in acute distress. HENT:     Head: Normocephalic and atraumatic.  Eyes:     General:        Right eye: No discharge.        Left eye: No discharge.  Neck:     Trachea: No tracheal deviation.  Cardiovascular:     Rate and Rhythm: Normal rate and regular rhythm.     Heart sounds: Normal heart sounds. No murmur.  Pulmonary:     Effort:  Pulmonary effort is normal. No respiratory distress.     Breath sounds: Normal breath sounds.  Lymphadenopathy:     Cervical: No cervical adenopathy.  Skin:    General: Skin is warm and dry.  Neurological:     Mental Status: She is alert.     Coordination: Coordination normal.  Psychiatric:        Behavior: Behavior normal.    The toes are somewhat cooler to the touch her pulses are normal in her feet I find no evidence of cyanosis she does have some swelling in the distal plantar portion of the foot the arch is normal ankle is normal       Assessment & Plan:  It is hard to know if this is reflex sympathetic dystrophy or other nerve damage.  Also patient has some scarring tissue as well as some intermittent swelling related to previous problems in her foot  She has seen podiatrist at [facility] she is also seen podiatrist at [facility] She has had ongoing issues with this  We talked about pain control options include hydrocodone sparing use versus gabapentin versus Lyrica  Patient relates that she has a fear of gabapentin Lyrica because of side effects we discussed how it is necessary to also have a healthy respect for hydrocodone because of the risk of addiction  A short prescription hydrocodone was written to use sparingly  Referral for additional  podiatry opinion  Also lab work indicated because of hypothyroidism and hyperinsulinemia  Follow-up within 6 months

## 2019-01-09 ENCOUNTER — Encounter: Payer: Self-pay | Admitting: Family Medicine

## 2019-01-10 ENCOUNTER — Encounter: Payer: Self-pay | Admitting: Family Medicine

## 2019-01-11 ENCOUNTER — Other Ambulatory Visit: Payer: Self-pay | Admitting: Family Medicine

## 2019-01-11 MED ORDER — HYDROCODONE-ACETAMINOPHEN 10-325 MG PO TABS: ORAL_TABLET | ORAL | 0 refills | Status: DC

## 2019-01-19 DIAGNOSIS — F902 Attention-deficit hyperactivity disorder, combined type: Secondary | ICD-10-CM | POA: Diagnosis not present

## 2019-01-19 DIAGNOSIS — F331 Major depressive disorder, recurrent, moderate: Secondary | ICD-10-CM | POA: Diagnosis not present

## 2019-02-07 ENCOUNTER — Encounter: Payer: Self-pay | Admitting: Family Medicine

## 2019-02-07 ENCOUNTER — Other Ambulatory Visit: Payer: Self-pay | Admitting: Family Medicine

## 2019-02-07 DIAGNOSIS — M79671 Pain in right foot: Secondary | ICD-10-CM

## 2019-02-07 MED ORDER — HYDROCODONE-ACETAMINOPHEN 10-325 MG PO TABS: ORAL_TABLET | ORAL | 0 refills | Status: DC

## 2019-02-07 NOTE — Telephone Encounter (Signed)
Nurses Please assist with getting the ball rolling regarding re-referral for podiatry It is fine to send notification that this could be a while-some specialists are slowing down currently-given current outbreak

## 2019-02-09 DIAGNOSIS — M1990 Unspecified osteoarthritis, unspecified site: Secondary | ICD-10-CM | POA: Insufficient documentation

## 2019-02-09 DIAGNOSIS — N751 Abscess of Bartholin's gland: Secondary | ICD-10-CM | POA: Diagnosis not present

## 2019-02-15 ENCOUNTER — Encounter: Payer: Self-pay | Admitting: Family Medicine

## 2019-03-17 ENCOUNTER — Encounter: Payer: Self-pay | Admitting: Family Medicine

## 2019-03-20 ENCOUNTER — Other Ambulatory Visit: Payer: Self-pay | Admitting: Family Medicine

## 2019-03-20 ENCOUNTER — Other Ambulatory Visit: Payer: Self-pay | Admitting: *Deleted

## 2019-03-20 DIAGNOSIS — M79671 Pain in right foot: Secondary | ICD-10-CM

## 2019-03-20 MED ORDER — HYDROCODONE-ACETAMINOPHEN 10-325 MG PO TABS: ORAL_TABLET | ORAL | 0 refills | Status: DC

## 2019-03-20 NOTE — Telephone Encounter (Signed)
1.  [facility] she may have a referral to Dr. Ulice Brilliant podiatry #3 please notify patient that she will need to do office visit or virtual visit before next refill (Controlled medication requires office or virtual visit every 3 months)

## 2019-04-05 ENCOUNTER — Other Ambulatory Visit: Payer: Self-pay | Admitting: Family Medicine

## 2019-04-20 DIAGNOSIS — F331 Major depressive disorder, recurrent, moderate: Secondary | ICD-10-CM | POA: Diagnosis not present

## 2019-04-20 DIAGNOSIS — F902 Attention-deficit hyperactivity disorder, combined type: Secondary | ICD-10-CM | POA: Diagnosis not present

## 2019-05-16 ENCOUNTER — Ambulatory Visit: Payer: BLUE CROSS/BLUE SHIELD | Admitting: Podiatry

## 2019-06-13 DIAGNOSIS — M25561 Pain in right knee: Secondary | ICD-10-CM | POA: Insufficient documentation

## 2019-07-03 ENCOUNTER — Ambulatory Visit: Payer: BLUE CROSS/BLUE SHIELD | Admitting: Family Medicine

## 2019-07-07 ENCOUNTER — Ambulatory Visit: Payer: BLUE CROSS/BLUE SHIELD | Admitting: Podiatry

## 2019-07-11 ENCOUNTER — Ambulatory Visit: Payer: BLUE CROSS/BLUE SHIELD | Admitting: Podiatry

## 2019-07-11 ENCOUNTER — Ambulatory Visit (INDEPENDENT_AMBULATORY_CARE_PROVIDER_SITE_OTHER): Payer: BLUE CROSS/BLUE SHIELD

## 2019-07-11 ENCOUNTER — Other Ambulatory Visit: Payer: Self-pay

## 2019-07-11 ENCOUNTER — Encounter: Payer: Self-pay | Admitting: Podiatry

## 2019-07-11 VITALS — Temp 98.7°F

## 2019-07-11 DIAGNOSIS — M2041 Other hammer toe(s) (acquired), right foot: Secondary | ICD-10-CM | POA: Diagnosis not present

## 2019-07-11 DIAGNOSIS — M21622 Bunionette of left foot: Secondary | ICD-10-CM

## 2019-07-11 NOTE — Patient Instructions (Signed)
Pre-Operative Instructions  Congratulations, you have decided to take an important step towards improving your quality of life.  You can be assured that the doctors and staff at [facility] will be with you every step of the way.  Here are some important things you should know:  1. Plan to be at the surgery center/hospital at least 1 (one) hour prior to your scheduled time, unless otherwise directed by the surgical center/hospital staff.  You must have a responsible adult accompany you, remain during the surgery and drive you home.  Make sure you have directions to the surgical center/hospital to ensure you arrive on time. 2. If you are having surgery at [facility] or [facility], you will need a copy of your medical history and physical form from your family physician within one month prior to the date of surgery. We will give you a form for your primary physician to complete.  3. We make every effort to accommodate the date you request for surgery.  However, there are times where surgery dates or times have to be moved.  We will contact you as soon as possible if a change in schedule is required.   4. No aspirin/ibuprofen for one week before surgery.  If you are on aspirin, any non-steroidal anti-inflammatory medications (Mobic, Aleve, Ibuprofen) should not be taken seven (7) days prior to your surgery.  You make take Tylenol for pain prior to surgery.  5. Medications - If you are taking daily heart and blood pressure medications, seizure, reflux, allergy, asthma, anxiety, pain or diabetes medications, make sure you notify the surgery center/hospital before the day of surgery so they can tell you which medications you should take or avoid the day of surgery. 6. No food or drink after midnight the night before surgery unless directed otherwise by surgical center/hospital staff. 7. No alcoholic beverages 24-hours prior to surgery.  No smoking 24-hours prior or 24-hours after  surgery. 8. Wear loose pants or shorts. They should be loose enough to fit over bandages, boots, and casts. 9. Don't wear slip-on shoes. Sneakers are preferred. 10. Bring your boot with you to the surgery center/hospital.  Also bring crutches or a walker if your physician has prescribed it for you.  If you do not have this equipment, it will be provided for you after surgery. 11. If you have not been contacted by the surgery center/hospital by the day before your surgery, call to confirm the date and time of your surgery. 12. Leave-time from work may vary depending on the type of surgery you have.  Appropriate arrangements should be made prior to surgery with your employer. 13. Prescriptions will be provided immediately following surgery by your doctor.  Fill these as soon as possible after surgery and take the medication as directed. Pain medications will not be refilled on weekends and must be approved by the doctor. 14. Remove nail polish on the operative foot and avoid getting pedicures prior to surgery. 15. Wash the night before surgery.  The night before surgery wash the foot and leg well with water and the antibacterial soap provided. Be sure to pay special attention to beneath the toenails and in between the toes.  Wash for at least three (3) minutes. Rinse thoroughly with water and dry well with a towel.  Perform this wash unless told not to do so by your physician.  Enclosed: 1 Ice pack (please put in freezer the night before surgery)   1 Hibiclens skin cleaner     Pre-op instructions  If you have any questions regarding the instructions, please do not hesitate to call our office.  [place]: [address], [place], [place] [address] -- [phone]  [place]: [address] [place], [place] [address] -- [phone]  [place]: [address]  [place], [place] [address] -- [phone]  [place]: [address], [address], [place], [place] [address] -- [phone]  Website:  [email or web address] 

## 2019-07-14 ENCOUNTER — Telehealth: Payer: Self-pay | Admitting: *Deleted

## 2019-07-14 NOTE — Telephone Encounter (Signed)
"  I saw Dr. Amalia Hailey on Tuesday in [place].  I'm going to have to have surgery.  Call me at your convenience next week so I can talk to you about what he has available.  I think he said he's booked out about a month."

## 2019-07-15 NOTE — Progress Notes (Signed)
   HPI: 48 y.o. female presenting today for evaluation of chronic bilateral foot pain.  Patient has a history of foot surgery in 2016 to address hammertoe deformity to the second digit.  Patient continues to have severe pain and tenderness associated to the bilateral feet.  Conservative modalities of been unsuccessful despite appropriate shoe gear, anti-inflammatories and resting and icing the foot.  She presents today for possible surgical consultation and additional modalities to fix the patient's feet.  She states that despite different types of shoe gear she is unable to fit in her shoes without pain.  Past Medical History:  Diagnosis Date  . Depression   . Dysthymia 1999  . Hypothyroidism   . PCOS (polycystic ovarian syndrome) 2000  . Prediabetes      Physical Exam: General: The patient is alert and oriented x3 in no acute distress.  Dermatology: Skin is warm, dry and supple bilateral lower extremities. Negative for open lesions or macerations.  Vascular: Palpable pedal pulses bilaterally. No edema or erythema noted. Capillary refill within normal limits.  Neurological: Epicritic and protective threshold grossly intact bilaterally.   Musculoskeletal Exam: Range of motion within normal limits to all pedal and ankle joints bilateral. Muscle strength 5/5 in all groups bilateral.   Radiographic Exam:  Normal osseous mineralization. Joint spaces preserved. No fracture/dislocation/boney destruction.  Progressed bunion deformity noted with intermetatarsal angle greater than 15 degrees bilaterally.  Tailor's bunion also noted with increased intermetatarsal angle between the fourth and fifth metatarsals bilaterally.  There is also noted a large metatarsal splay to the bilateral metatarsals.  Assessment: 1.  Hallux abductovalgus deformity bilateral 2.  Tailor's bunion bilateral 3.  Metatarsal splay bilateral   Plan of Care:  1. Patient evaluated. X-Rays reviewed.  2.  Today we discussed  different possible conservative modalities as well as surgical intervention to the patient's symptoms.  The patient would like to pursue conservative management.  All possible complications and details the procedure were explained.  No guarantees were expressed or implied. 3.  Authorization for surgery was initiated today.  Surgery would consist of bunionectomy with first metatarsal osteotomy.  Tailor's bunionectomy with fifth metatarsal osteotomy.  Fourth metatarsal osteotomy to realign the fourth metatarsal and close the intermetatarsal angle and correct for the metatarsal splay.  Possible hammertoe correction digits 3, 4, 5.  Please see consent form for details of procedures 4.  Return to clinic 1 week postop      Edrick Kins, DPM [facility]  Dr. Edrick Kins, DPM    [address], [place] [address]                Office [phone]  Fax [phone]

## 2019-07-17 ENCOUNTER — Other Ambulatory Visit: Payer: Self-pay | Admitting: Family Medicine

## 2019-07-18 NOTE — Telephone Encounter (Signed)
May have 90-day Will need to do labs this fall Will need a follow-up visit in person or virtual

## 2019-07-20 ENCOUNTER — Telehealth: Payer: Self-pay | Admitting: Family Medicine

## 2019-07-20 ENCOUNTER — Other Ambulatory Visit: Payer: Self-pay

## 2019-07-20 DIAGNOSIS — F902 Attention-deficit hyperactivity disorder, combined type: Secondary | ICD-10-CM | POA: Diagnosis not present

## 2019-07-20 DIAGNOSIS — F331 Major depressive disorder, recurrent, moderate: Secondary | ICD-10-CM | POA: Diagnosis not present

## 2019-07-20 MED ORDER — SYNTHROID 150 MCG PO TABS: ORAL_TABLET | ORAL | 2 refills | Status: DC

## 2019-07-20 NOTE — Telephone Encounter (Signed)
Pt is requesting [place] to order her Synthroid in a 30 day supply due to cost   Pt states she has to use the name brand & can't afford it in a 90 day supply  Please advise & call pt when done   [facility]

## 2019-07-20 NOTE — Telephone Encounter (Signed)
30 day supply with 2 refills (to equal 90 day) sent to [facility]. Pt notified. Also informed pt that provider would like her to do labs in the fall with an office visit. Pt verbalized understanding.

## 2019-07-25 NOTE — Telephone Encounter (Signed)
I'm returning your call.  You want to schedule your surgery with Dr. Amalia Hailey?  "Yes, I would.  He told me that he was about a month out."  Yes, his next available date is September 14, 2019.  "Can we push it back a week and do it on November 5?"  Yes, that date is fine.  He can do it on September 21, 2019.  I'll get it scheduled.  "Can you tell me how much I'll have to pay out of pocket?"  I'll ask Jocelyn Lamer in our insurance department to give you a call and give you an estimate.  You need to call the surgical center to get their fees as well as the anesthesia fee.  "Is there anything else I need to do?"  You need to register with the surgical center, the instructions on how to do that is in the brochure that we gave you.  "Okay, thank you so much for your help."

## 2019-08-03 ENCOUNTER — Encounter: Payer: Self-pay | Admitting: Nurse Practitioner

## 2019-09-01 ENCOUNTER — Telehealth: Payer: Self-pay | Admitting: *Deleted

## 2019-09-01 NOTE — Telephone Encounter (Signed)
DOS 09/21/2019 [facility] [place] - [id number], METATARSAL OSTEOTOMY 4, 5 - [id number], HAMMER TOE REPAIR 3,4,5 - [id number], AND CAPSULOTOMY MPJ RELEASE JOINT 3,4,5 - [id number] RIGHT FOOT  BCBS: Eligibility Date - 09/16/2018 - 09/16/2019    Check in November

## 2019-09-06 NOTE — Telephone Encounter (Signed)
"  I need to cancel my surgery that's scheduled for November 5.  I have tried and tried to find help.  I have a special needs daughter.  I haven't found anyone to help with her and to help with me.  My resources are not good.  I don't know how to reach Dr. Amalia Hailey.  Hopefully you can give him a message.  Ask him to call me.  It's got to be done sometime but it's looking like it's going to be next year.

## 2019-09-19 ENCOUNTER — Other Ambulatory Visit: Payer: Self-pay | Admitting: Family Medicine

## 2019-09-19 NOTE — Telephone Encounter (Signed)
Pt contact pt to set up appt. Then may route to nurses. Thank you

## 2019-09-20 NOTE — Telephone Encounter (Signed)
Left message

## 2019-09-26 NOTE — Telephone Encounter (Signed)
lvm to schedule appt.  

## 2019-10-02 ENCOUNTER — Telehealth: Payer: Self-pay | Admitting: Family Medicine

## 2019-10-02 NOTE — Telephone Encounter (Signed)
Prescription sent electronically to pharmacy. Patient notified. 

## 2019-10-02 NOTE — Telephone Encounter (Signed)
Pt would like refill on SYNTHROID 150 MCG tablet (name brand). Please send to [place], [place] [address]  Pt has virtual set up on 11/30. Pt is completely out of medication.

## 2019-10-02 NOTE — Telephone Encounter (Signed)
May have 30-day sent in please

## 2019-10-16 ENCOUNTER — Ambulatory Visit (INDEPENDENT_AMBULATORY_CARE_PROVIDER_SITE_OTHER): Payer: BLUE CROSS/BLUE SHIELD | Admitting: Family Medicine

## 2019-10-16 ENCOUNTER — Other Ambulatory Visit: Payer: Self-pay

## 2019-10-16 DIAGNOSIS — Z79899 Other long term (current) drug therapy: Secondary | ICD-10-CM

## 2019-10-16 DIAGNOSIS — E039 Hypothyroidism, unspecified: Secondary | ICD-10-CM

## 2019-10-16 DIAGNOSIS — Z13220 Encounter for screening for lipoid disorders: Secondary | ICD-10-CM | POA: Diagnosis not present

## 2019-10-16 MED ORDER — HYDROCODONE-ACETAMINOPHEN 5-325 MG PO TABS: 1.0000 | ORAL_TABLET | ORAL | 0 refills | Status: AC | PRN

## 2019-10-16 MED ORDER — HYDROCODONE-ACETAMINOPHEN 5-325 MG PO TABS: 1.0000 | ORAL_TABLET | ORAL | 0 refills | Status: DC | PRN

## 2019-10-16 MED ORDER — SYNTHROID 150 MCG PO TABS: ORAL_TABLET | ORAL | 5 refills | Status: DC

## 2019-10-16 NOTE — Progress Notes (Signed)
   Subjective:    Patient ID: Bonnie [name], female    DOB: 1971/05/01, 48 y.o.   MRN: [id number]  HPI Pt needing refills on chronic meds.   Pt states she is doing well since getting back on her Levothyroxine 150 mg. Patient has thyroid condition.  Takes thyroid medication on a regular basis.  States that the proper way.  Relates compliance.  States no negative side effects.  States condition seems to be under good control.   Pt states she has to take her WellButrin every day.  She states her moods overall are doing well   Virtual Visit via Telephone Note  I connected with Bonnie [name] on 10/16/19 at  8:30 AM EST by telephone and verified that I am speaking with the correct person using two identifiers.  Location: Patient: home Provider: office   I discussed the limitations, risks, security and privacy concerns of performing an evaluation and management service by telephone and the availability of in person appointments. I also discussed with the patient that there may be a patient responsible charge related to this service. The patient expressed understanding and agreed to proceed.   History of Present Illness:    Observations/Objective:   Assessment and Plan:   Follow Up Instructions:    I discussed the assessment and treatment plan with the patient. The patient was provided an opportunity to ask questions and all were answered. The patient agreed with the plan and demonstrated an understanding of the instructions.   The patient was advised to call back or seek an in-person evaluation if the symptoms worsen or if the condition fails to improve as anticipated.  I provided 18 minutes of non-face-to-face time during this encounter.   Vicente Males, LPN    Review of Systems  Constitutional: Negative for activity change, appetite change and fatigue.  HENT: Negative for congestion and rhinorrhea.   Respiratory: Negative for cough and shortness of breath.    Cardiovascular: Negative for chest pain and leg swelling.  Gastrointestinal: Negative for abdominal pain and diarrhea.  Endocrine: Negative for polydipsia and polyphagia.  Skin: Negative for color change.  Neurological: Negative for dizziness and weakness.  Psychiatric/Behavioral: Negative for behavioral problems and confusion.       Objective:   Physical Exam Today's visit was via telephone Physical exam was not possible for this visit        Assessment & Plan:  Hypothyroidism she uses brand-name only medicine refills were sent and she will check TSH in the coming weeks along with cholesterol and kidney function  Stress wise she is holding up fairly well  She does have intermittent foot pain and back pain and requests a short prescription of hydrocodone this was provided  Follow-up in 6 months sooner if any problems

## 2019-10-23 ENCOUNTER — Encounter: Payer: Self-pay | Admitting: Family Medicine

## 2019-10-24 ENCOUNTER — Other Ambulatory Visit: Payer: Self-pay | Admitting: Family Medicine

## 2019-10-24 MED ORDER — HYDROCODONE-ACETAMINOPHEN 10-325 MG PO TABS: 1.0000 | ORAL_TABLET | ORAL | 0 refills | Status: DC | PRN

## 2019-10-25 ENCOUNTER — Other Ambulatory Visit: Payer: Self-pay | Admitting: Family Medicine

## 2019-11-01 ENCOUNTER — Encounter: Payer: Self-pay | Admitting: Family Medicine

## 2019-11-01 DIAGNOSIS — D509 Iron deficiency anemia, unspecified: Secondary | ICD-10-CM

## 2019-11-02 NOTE — Addendum Note (Signed)
Addended by: Vicente Males on: 11/02/2019 04:53 PM   Modules accepted: Orders

## 2019-11-02 NOTE — Telephone Encounter (Signed)
Nurses  Please add hemoglobin /hematocrit, also ferritin to her lab work with diagnosis of iron deficient anemia to the lab orders from late November  Once the lab will orders are redone notify patient via MyChart thank you

## 2019-11-14 DIAGNOSIS — F902 Attention-deficit hyperactivity disorder, combined type: Secondary | ICD-10-CM | POA: Diagnosis not present

## 2019-11-14 DIAGNOSIS — F331 Major depressive disorder, recurrent, moderate: Secondary | ICD-10-CM | POA: Diagnosis not present

## 2019-11-22 ENCOUNTER — Other Ambulatory Visit: Payer: Self-pay | Admitting: Family Medicine

## 2019-12-09 ENCOUNTER — Other Ambulatory Visit: Payer: Self-pay | Admitting: Family Medicine

## 2019-12-18 ENCOUNTER — Other Ambulatory Visit: Payer: Self-pay | Admitting: Family Medicine

## 2019-12-18 ENCOUNTER — Encounter: Payer: Self-pay | Admitting: Family Medicine

## 2019-12-18 MED ORDER — HYDROCODONE-ACETAMINOPHEN 10-325 MG PO TABS: 1.0000 | ORAL_TABLET | ORAL | 0 refills | Status: DC | PRN

## 2019-12-18 NOTE — Telephone Encounter (Signed)
Patient's pain medication was sent in [facility] as requested  Patient can schedule her lab draw through [email or web address] online she can set up a patient portal and schedule actual time to get her blood drawn-FYI

## 2019-12-18 NOTE — Telephone Encounter (Signed)
Last med check up was 10/16/19 and was also seen for foot pain that day

## 2019-12-19 ENCOUNTER — Encounter: Payer: Self-pay | Admitting: Family Medicine

## 2019-12-19 DIAGNOSIS — E039 Hypothyroidism, unspecified: Secondary | ICD-10-CM

## 2019-12-19 DIAGNOSIS — D509 Iron deficiency anemia, unspecified: Secondary | ICD-10-CM | POA: Diagnosis not present

## 2019-12-19 DIAGNOSIS — Z79899 Other long term (current) drug therapy: Secondary | ICD-10-CM | POA: Diagnosis not present

## 2019-12-19 DIAGNOSIS — Z13220 Encounter for screening for lipoid disorders: Secondary | ICD-10-CM | POA: Diagnosis not present

## 2019-12-20 LAB — BASIC METABOLIC PANEL
BUN/Creatinine Ratio: 10 (ref 9–23)
BUN: 9 mg/dL (ref 6–24)
CO2: 22 mmol/L (ref 20–29)
Calcium: 9.6 mg/dL (ref 8.7–10.2)
Chloride: 105 mmol/L (ref 96–106)
Creatinine, Ser: 0.93 mg/dL (ref 0.57–1.00)
GFR calc Af Amer: 84 mL/min/{1.73_m2} (ref >59)
GFR calc non Af Amer: 73 mL/min/{1.73_m2} (ref >59)
Glucose: 87 mg/dL (ref 65–99)
Potassium: 4.3 mmol/L (ref 3.5–5.2)
Sodium: 141 mmol/L (ref 134–144)

## 2019-12-20 LAB — TSH: TSH: 25.8 u[IU]/mL — ABNORMAL HIGH (ref 0.450–4.500)

## 2019-12-20 LAB — HEMOGLOBIN AND HEMATOCRIT, BLOOD
Hematocrit: 40.7 % (ref 34.0–46.6)
Hemoglobin: 13.9 g/dL (ref 11.1–15.9)

## 2019-12-20 LAB — LIPID PANEL
Chol/HDL Ratio: 2.8 ratio (ref 0.0–4.4)
Cholesterol, Total: 251 mg/dL — ABNORMAL HIGH (ref 100–199)
HDL: 89 mg/dL (ref >39)
LDL Chol Calc (NIH): 135 mg/dL — ABNORMAL HIGH (ref 0–99)
Triglycerides: 155 mg/dL — ABNORMAL HIGH (ref 0–149)
VLDL Cholesterol Cal: 27 mg/dL (ref 5–40)

## 2019-12-20 LAB — FERRITIN: Ferritin: 38 ng/mL (ref 15–150)

## 2019-12-25 ENCOUNTER — Encounter: Payer: Self-pay | Admitting: Family Medicine

## 2019-12-25 ENCOUNTER — Telehealth: Payer: Self-pay | Admitting: *Deleted

## 2019-12-25 NOTE — Telephone Encounter (Signed)
Patient requests Ferralet 90 which includes:   Iron - 90mg , Folic acid , Vit b12 , Vit C 120 mg and colace 50mg - cost per pharmacy $141 for 30 tabs and unsure if will be coverd by insurance   Please advise

## 2019-12-25 NOTE — Telephone Encounter (Signed)
Nurses  Please order ferritin 90 taken 1 daily #30 with 3 refills Also continue levothyroxine 150 mcg daily Add levothyroxine 25 mcg daily #35 refills This will be a total of 175 mcg daily Patient needs to take this in the morning on an empty stomach 30 minutes before breakfast  Repeat ferritin, hemoglobin hematocrit, TIBC, TSH, free T4 in approximately 8 to 12 weeks Keep regular follow-ups

## 2019-12-26 MED ORDER — FERRALET 90 90-1 MG PO TABS: ORAL_TABLET | ORAL | 2 refills | Status: DC

## 2019-12-26 MED ORDER — SYNTHROID 25 MCG PO TABS: 25.0000 ug | ORAL_TABLET | Freq: Every day | ORAL | 5 refills | Status: DC

## 2019-12-26 NOTE — Telephone Encounter (Signed)
So I am not opposed to sending this and for the patient take 1 daily #30 with 2 refills but there is a strong probability it will not be covered by insurance and if that be the case we will have to go with generic alternatives because there are multiple generic alternatives that would be able to handle the issue

## 2019-12-26 NOTE — Addendum Note (Signed)
Addended by: Margaretha Sheffield on: 12/26/2019 11:18 AM   Modules accepted: Orders

## 2019-12-26 NOTE — Telephone Encounter (Signed)
Patient checking on this

## 2019-12-26 NOTE — Telephone Encounter (Signed)
I have not had a chance to act on this yet Bonnie [name] Please call her pharmacy see if there is any iron formulation that may have that is prescription or OTC that is better than iron sulfate 325 mg 1 daily Preferably generic.  Then let me know then hopefully we can move forward at that point thank you

## 2019-12-26 NOTE — Telephone Encounter (Signed)
Prescription was sent electronically to pharmacy. Patient notified via my chart.

## 2019-12-26 NOTE — Telephone Encounter (Signed)
Patient will check with the pharmacy and see if she can afford the iron pills and let us know if she has any problems. She will also let us know if she decides she would like a referral to hematology for a possible iron infusion.

## 2019-12-26 NOTE — Telephone Encounter (Signed)
Patient states this medicine helped her feel tremendously better last time she took it within a few days- would like to see if insurance would cover it if you think it is a good medicine with what she needs in it

## 2019-12-26 NOTE — Addendum Note (Signed)
Addended by: Ariyan Brisendine S on: 12/26/2019 11:18 AM   Modules accepted: Orders  

## 2020-01-29 ENCOUNTER — Encounter: Payer: Self-pay | Admitting: Family Medicine

## 2020-01-29 NOTE — Telephone Encounter (Signed)
Last med check up 10/16/19 

## 2020-01-29 NOTE — Telephone Encounter (Signed)
Nurses Please to repeat thyroid studies by the first part of April The orders are already in the system  Please also convert telephone message and put a pended refill of her pain medicine with the message  Also via [facility] rules and regulations/DEA rules patient will need to do an office visit before additional pain medicines after this refill

## 2020-01-30 ENCOUNTER — Other Ambulatory Visit: Payer: Self-pay | Admitting: *Deleted

## 2020-01-30 ENCOUNTER — Telehealth: Payer: Self-pay | Admitting: *Deleted

## 2020-01-30 MED ORDER — HYDROCODONE-ACETAMINOPHEN 10-325 MG PO TABS: 1.0000 | ORAL_TABLET | ORAL | 0 refills | Status: DC | PRN

## 2020-01-30 NOTE — Telephone Encounter (Signed)
Medication was sent in thank you 

## 2020-01-30 NOTE — Telephone Encounter (Signed)
Called and discussed with pt and phone message put in and sent to dr scott to refill pain med.

## 2020-01-30 NOTE — Telephone Encounter (Signed)
Pt sent my chart message requesting refill on pain med and dr scott approved one refill and pt was notified that she needs visit before further refills. Med is pended and ready to sign.  [facility]  

## 2020-01-30 NOTE — Telephone Encounter (Signed)
Pt sent my chart message requesting refill on pain med and dr Lorin Picket approved one refill and pt was notified that she needs visit before further refills. Med is pended and ready to sign.  [facility]

## 2020-02-09 NOTE — Telephone Encounter (Signed)
error 

## 2020-02-15 ENCOUNTER — Other Ambulatory Visit: Payer: Self-pay | Admitting: Family Medicine

## 2020-02-16 NOTE — Telephone Encounter (Signed)
May have 3 months worth of medicine needs follow-up by summer

## 2020-03-12 DIAGNOSIS — F902 Attention-deficit hyperactivity disorder, combined type: Secondary | ICD-10-CM | POA: Diagnosis not present

## 2020-03-12 DIAGNOSIS — F331 Major depressive disorder, recurrent, moderate: Secondary | ICD-10-CM | POA: Diagnosis not present

## 2020-04-23 ENCOUNTER — Encounter: Payer: Self-pay | Admitting: Podiatry

## 2020-04-25 ENCOUNTER — Other Ambulatory Visit: Payer: Self-pay | Admitting: Family Medicine

## 2020-04-25 DIAGNOSIS — E039 Hypothyroidism, unspecified: Secondary | ICD-10-CM

## 2020-04-25 DIAGNOSIS — D509 Iron deficiency anemia, unspecified: Secondary | ICD-10-CM

## 2020-04-25 NOTE — Telephone Encounter (Signed)
TSH, ferritin, hypothyroidism, iron deficient anemia

## 2020-04-25 NOTE — Telephone Encounter (Signed)
Scheduled 6/30

## 2020-05-08 ENCOUNTER — Other Ambulatory Visit: Payer: Self-pay | Admitting: Family Medicine

## 2020-05-08 NOTE — Telephone Encounter (Signed)
Med check up 09/19/19

## 2020-05-09 ENCOUNTER — Other Ambulatory Visit: Payer: Self-pay | Admitting: *Deleted

## 2020-05-09 ENCOUNTER — Encounter: Payer: Self-pay | Admitting: Family Medicine

## 2020-05-09 NOTE — Telephone Encounter (Signed)
Hydrocodone pended and sent back through patient call. Will notify pt after dr scott signs

## 2020-05-09 NOTE — Telephone Encounter (Signed)
Last med check up 10/16/19

## 2020-05-09 NOTE — Telephone Encounter (Signed)
Nurses May have 1 refill on her medicines Please pend any controlled medicine Please also forward the message to the patient  Hi Bonnie [name], We are happy to help out But please be aware in the future all pain medication or other controlled medications can take up to 48 hours. Very typically during the day we are so busy with date to date patient care that my chart messages are often handled as we can and are often handled at nighttime. So therefore in the future request for medications refills can take up to 48 hours Thank you for your understanding Dr. Lorin Picket

## 2020-05-10 ENCOUNTER — Other Ambulatory Visit: Payer: Self-pay | Admitting: *Deleted

## 2020-05-10 DIAGNOSIS — D509 Iron deficiency anemia, unspecified: Secondary | ICD-10-CM | POA: Diagnosis not present

## 2020-05-10 DIAGNOSIS — E039 Hypothyroidism, unspecified: Secondary | ICD-10-CM | POA: Diagnosis not present

## 2020-05-10 MED ORDER — HYDROCODONE-ACETAMINOPHEN 10-325 MG PO TABS: 1.0000 | ORAL_TABLET | ORAL | 0 refills | Status: DC | PRN

## 2020-05-10 NOTE — Telephone Encounter (Signed)
Hydrocodone pended in rx request not pt call. Still waiting for dr scott to sign then can send pt dr scott's message below

## 2020-05-11 LAB — FERRITIN: Ferritin: 72 ng/mL (ref 15–150)

## 2020-05-11 LAB — TSH: TSH: 0.702 u[IU]/mL (ref 0.450–4.500)

## 2020-05-15 ENCOUNTER — Ambulatory Visit: Payer: BLUE CROSS/BLUE SHIELD | Admitting: Family Medicine

## 2020-05-15 ENCOUNTER — Other Ambulatory Visit: Payer: Self-pay

## 2020-05-15 ENCOUNTER — Encounter: Payer: Self-pay | Admitting: Family Medicine

## 2020-05-15 VITALS — BP 124/76 | Temp 97.4°F | Ht 66.0 in | Wt 177.6 lb

## 2020-05-15 DIAGNOSIS — E039 Hypothyroidism, unspecified: Secondary | ICD-10-CM | POA: Diagnosis not present

## 2020-05-15 DIAGNOSIS — D509 Iron deficiency anemia, unspecified: Secondary | ICD-10-CM

## 2020-05-15 DIAGNOSIS — M19071 Primary osteoarthritis, right ankle and foot: Secondary | ICD-10-CM | POA: Diagnosis not present

## 2020-05-15 DIAGNOSIS — G8929 Other chronic pain: Secondary | ICD-10-CM

## 2020-05-15 DIAGNOSIS — M19072 Primary osteoarthritis, left ankle and foot: Secondary | ICD-10-CM

## 2020-05-15 DIAGNOSIS — Z1211 Encounter for screening for malignant neoplasm of colon: Secondary | ICD-10-CM

## 2020-05-15 DIAGNOSIS — E538 Deficiency of other specified B group vitamins: Secondary | ICD-10-CM

## 2020-05-15 MED ORDER — HYDROCODONE-ACETAMINOPHEN 10-325 MG PO TABS: 1.0000 | ORAL_TABLET | ORAL | 0 refills | Status: AC | PRN

## 2020-05-15 MED ORDER — HYDROCODONE-ACETAMINOPHEN 10-325 MG PO TABS: 1.0000 | ORAL_TABLET | ORAL | 0 refills | Status: DC | PRN

## 2020-05-15 MED ORDER — LEVOTHYROXINE SODIUM 175 MCG PO TABS: 175.0000 ug | ORAL_TABLET | Freq: Every day | ORAL | 5 refills | Status: DC

## 2020-05-15 NOTE — Progress Notes (Signed)
   Subjective:    Patient ID: Bonnie [name], female    DOB: 12-09-1970, 49 y.o.   MRN: [id number]  HPIFollow up on bloodwork.  We did discuss her recent ferritin also discussed recent thyroid function patient doing well with the medication.  She is staying active eating healthy and trying to lose weight she denies any setbacks recently Concerned about dark marks on finger.  This was where she was wearing silver rings more than likely this is a metal reaction to the skin  Patient has chronic pain of her feet from bunions and surgeries that did not go well her current podiatrist is talking about doing extensive surgery she is hoping to get this done in the future she states the pain is severe sometimes has to take pain medicine sometimes 1 a day sometimes 2 a day but does not exceed 30 in a given month she has tried anti-inflammatory and other measures without success    Review of Systems  Constitutional: Negative for activity change and appetite change.  HENT: Negative for congestion and rhinorrhea.   Respiratory: Negative for cough and shortness of breath.   Cardiovascular: Negative for chest pain and leg swelling.  Gastrointestinal: Negative for abdominal pain, nausea and vomiting.  Skin: Negative for color change.  Neurological: Negative for dizziness and weakness.  Psychiatric/Behavioral: Negative for agitation and confusion.       Objective:   Physical Exam Lungs clear respiratory rate normal heart regular no murmurs extremities no edema skin warm dry bunions noted on the feet Patient is aware to use the pain medicine sparingly and only when at home and never drive with pain medicine or when feeling drowsy      Assessment & Plan:  1. Hypothyroidism, unspecified type Patient requests T4 to be checked TSH looks good continue 175 mcg daily - T4, free  2. Iron deficiency anemia, unspecified iron deficiency anemia type I told the patient there has to be a generic OTC iron  tablet that would cost less than what she is taking currently she will talk with pharmacist check lab work I also encourage her to get a colonoscopy because it was felt her anemia was related to dysfunctional bleeding but she has had ablation and still has anemia issues it could be not absorbing iron properly - CBC with Differential/Platelet - Iron Binding Cap (TIBC)([facility])  3. Vitamin B 12 deficiency Patient would requesting B12 testing - Vitamin B12  4. Primary osteoarthritis of both feet Severe arthritic changes of both feet as well as bunions follow through with podiatry  5. Screen for colon cancer Referral for colonoscopy see above - Ambulatory referral to Gastroenterology  6. Encounter for chronic pain management The patient was seen in followup for chronic pain. A review over at their current pain status was discussed. Drug registry was checked. Prescriptions were given.  Regular follow-up recommended. Discussion was held regarding the importance of compliance with medication as well as pain medication contract.  Patient was informed that medication may cause drowsiness and should not be combined  with other medications/alcohol or street drugs. If the patient feels medication is causing altered alertness then do not drive or operate dangerous equipment.  Drug registry checked 3 prescription sent in patient to follow-up when she needs her next prescription pain contract and UPT on next visit

## 2020-05-22 ENCOUNTER — Encounter: Payer: Self-pay | Admitting: Family Medicine

## 2020-05-22 ENCOUNTER — Encounter (INDEPENDENT_AMBULATORY_CARE_PROVIDER_SITE_OTHER): Payer: Self-pay | Admitting: *Deleted

## 2020-05-27 ENCOUNTER — Encounter: Payer: Self-pay | Admitting: Family Medicine

## 2020-05-27 ENCOUNTER — Other Ambulatory Visit: Payer: Self-pay

## 2020-05-27 MED ORDER — LEVOTHYROXINE SODIUM 175 MCG PO TABS: 175.0000 ug | ORAL_TABLET | Freq: Every day | ORAL | 5 refills | Status: DC

## 2020-06-04 ENCOUNTER — Ambulatory Visit: Payer: BLUE CROSS/BLUE SHIELD | Admitting: Podiatry

## 2020-06-21 ENCOUNTER — Other Ambulatory Visit: Payer: Self-pay

## 2020-06-21 ENCOUNTER — Ambulatory Visit (INDEPENDENT_AMBULATORY_CARE_PROVIDER_SITE_OTHER): Payer: BLUE CROSS/BLUE SHIELD | Admitting: Podiatry

## 2020-06-21 DIAGNOSIS — M21622 Bunionette of left foot: Secondary | ICD-10-CM | POA: Diagnosis not present

## 2020-06-21 DIAGNOSIS — M2041 Other hammer toe(s) (acquired), right foot: Secondary | ICD-10-CM

## 2020-06-23 NOTE — Progress Notes (Signed)
HPI: 49 y.o. female presenting today for follow-up evaluation of chronic bilateral foot pain.  Patient has a history of foot surgery in 2016 to address hammertoe deformity to the second digit.  Patient continues to have severe pain and tenderness associated to the bilateral feet.  Conservative modalities of been unsuccessful despite appropriate shoe gear, anti-inflammatories and resting and icing the foot.    Patient was last seen in the office on 07/11/2019 at which time we decided to proceed with surgery, however the patient currently does not have any assistance or help to recover postsurgically.  She canceled her surgery at that time.  She currently cares for her daughter who is 7 years old and has not been diagnosed with failure to thrive.  Currently she is still not in a position to have her feet corrected because her daughter relies on her for care daily.However she presents today just to keep in touch and to discuss her options  Past Medical History:  Diagnosis Date  . Depression   . Dysthymia 1999  . Hypothyroidism   . PCOS (polycystic ovarian syndrome) 2000  . Prediabetes      Physical Exam: General: The patient is alert and oriented x3 in no acute distress.  Dermatology: Skin is warm, dry and supple bilateral lower extremities. Negative for open lesions or macerations.  There is a preulcerative hyperkeratotic callus lesion noted to the right foot.  Sensitivity with light touch  Vascular: Palpable pedal pulses bilaterally. No edema or erythema noted. Capillary refill within normal limits.  Neurological: Epicritic and protective threshold grossly intact bilaterally.   Musculoskeletal Exam: Range of motion within normal limits to all pedal and ankle joints bilateral. Muscle strength 5/5 in all groups bilateral.   Radiographic Exam:  Normal osseous mineralization. Joint spaces preserved. No fracture/dislocation/boney destruction.  Progressed bunion deformity noted with  intermetatarsal angle greater than 15 degrees bilaterally.  Tailor's bunion also noted with increased intermetatarsal angle between the fourth and fifth metatarsals bilaterally.  There is also noted a large metatarsal splay to the bilateral metatarsals.  Assessment: 1.  Hallux abductovalgus deformity bilateral 2.  Tailor's bunion bilateral 3.  Metatarsal splay bilateral 4.  Benign skin lesion, preulcerative callus right foot   Plan of Care:  1. Patient evaluated.  After discussing in detail the patient's home situation that she has no one to help assist her postsurgically and also the fact that she cares for her daughter, I do agree she is not in a position to undergo surgery at this time.  Recommend additional conservative treatment including wide shoe gear and shoes that do not aggravate her feet.  Patient agrees to continue conservative treatment at this time 2.  Again we discussed surgical intervention and what that would entail as well as the post procedure recovery and her limitations after surgery. 3.  Surgery would consist of bunionectomy with first metatarsal osteotomy.  Tailor's bunionectomy with fifth metatarsal osteotomy.  Fourth metatarsal osteotomy to realign the fourth metatarsal and close the intermetatarsal angle and correct for the metatarsal splay.  Possible hammertoe correction digits 3, 4, 5.  Please see consent form for details of procedures 4.  Return to clinic when the patient is in a better position to undergo surgery. 5.  Excisional debridement of the preulcerative callus lesion was performed to the right foot using a tissue nipper without incident or bleeding       [name], DPM [facility]  Dr. [name], DPM  [address], [place] [address]                Office [phone]  Fax [phone]

## 2020-07-30 ENCOUNTER — Other Ambulatory Visit: Payer: Self-pay | Admitting: Family Medicine

## 2020-07-30 ENCOUNTER — Encounter: Payer: Self-pay | Admitting: Family Medicine

## 2020-07-31 NOTE — Telephone Encounter (Signed)
Nurses Please relay message to [place]  Unfortunately the combination of the medications that you are on go outside of my area of comfort and expertise.  I do have some patients who have adult ADD.  But I am not used to be prescribing the level of medicine that you are on.  Also the Provigil I typically do not prescribe in combination with amphetamine derivatives for ADD.  I hope you understand thank you-Dr. Lorin Picket

## 2020-08-01 ENCOUNTER — Other Ambulatory Visit: Payer: Self-pay | Admitting: Family Medicine

## 2020-08-01 ENCOUNTER — Telehealth: Payer: Self-pay | Admitting: *Deleted

## 2020-08-01 MED ORDER — MODAFINIL 200 MG PO TABS: ORAL_TABLET | ORAL | 1 refills | Status: DC

## 2020-08-01 MED ORDER — BUPROPION HCL ER (SR) 200 MG PO TB12: 200.0000 mg | ORAL_TABLET | Freq: Two times a day (BID) | ORAL | 1 refills | Status: DC

## 2020-08-01 NOTE — Telephone Encounter (Signed)
To: [facility]    From: MERIAH SHANDS    Created: 08/01/2020 11:59 AM      Thank you Toniann Fail.  I understand Dr. Roby Lofts message.  But I can't just stop taking my Wellbutrin.  And this is my 3rd day without it. Would you please ask him to at least prescribe that.  I don't need to continue to see Dr. Betti Cruz.  And it takes 3 months or more to get an appt. with him. But I need my Wellbutrin now.  Would you also please ask him if he would prescribe my Wellbutrin & my Modafinil. That is not a controlled substance.  And no one knows what it's like having no energy due to an under active Thyroid. I really need my Wellbutrin today if possible.  I don't want to go another day without my Wellbutrin.  If I can continue with my Synthroid, my Iron, antidepressant & my Modafinil, I will be fine.  I can do without the Adderall if he doesn't want to prescribe that.    Thank you,  Lucretia Field afternoon Cala Bradford,  I will send Dr. Lorin Picket your message and see how he would like to proceed but he is not in the office this afternoon and I do not know if it will reach him today. If you feel you need this medication urgently I recommend you contacting Dr. Betti Cruz if he has been prescribing it for you.   Sorry for the inconvenience and we will contact you after we speak with Dr. Lorin Picket.    This MyChart message has not been read.    FYI this was a response to a [employer]. After I messaged the patient back I couldn't figure out how to get it to you.

## 2020-08-01 NOTE — Telephone Encounter (Signed)
Modafinil and Wellbutrin was sent to [facility] on [address] 30-day supply with 1 refill on each Patient will need to do a follow-up visit for these somewhere in the next 60 days Please let the patient know that [place] fill is a controlled medication. It is unusual to be on ADD medicine and but did not fill because both of them are stimulants I will feel comfortable prescribing her Wellbutrin and modafinil as long as she is not on ADD medicine with this

## 2020-08-02 NOTE — Telephone Encounter (Signed)
Patient notified of Dr. Roby Lofts message and states she is fine not taking ADD med.She was sent to the front to schedule appointment.

## 2020-08-06 ENCOUNTER — Other Ambulatory Visit: Payer: Self-pay | Admitting: Family Medicine

## 2020-08-28 ENCOUNTER — Telehealth: Payer: Self-pay | Admitting: *Deleted

## 2020-08-28 NOTE — Telephone Encounter (Signed)
Patient notified and info given to infusion center.

## 2020-08-28 NOTE — Telephone Encounter (Signed)
Also This patient should be referred to the [facility] infusion center Please inform the patient that because of her weight, age, underlying health issues infusion of monoclonal antibodies would lessen the risk of a severe case and hospitalization (Nurses the phone number for the [facility] infusion center is on a sticky note on the lower right portion of the door to the closet in my office-you will need to call that number and give them the patient's name, date of birth, medical record number, phone number, when the symptoms began then: Infusion typically will reach out to the patient within 48 hours to help set this up) Please document accordingly that this was completed

## 2020-08-28 NOTE — Telephone Encounter (Signed)
Patient called wanting to let Dr. Lorin Picket know she tested positive for covid Sunday. Patient has had vomiting, diarrhea, aches, chills, fatigue, congestion and cough. Patient is able to keep food and water down at times, not experiencing any SOB. Patient aware that if vomiting and diarrhea continues or anything gets worse that she needs to go to the ER. Patient is concerned about her daughter who has cerebral palsy, who is at home with her and has no other options for care. Patient aware to social distance as much as possible, wash hands and disinfect surfaces and wear a mask when around her daughter. If daughter starts to show symptoms then she needs to be evaluated.

## 2020-08-29 ENCOUNTER — Telehealth: Payer: Self-pay | Admitting: Nurse Practitioner

## 2020-08-29 DIAGNOSIS — R52 Pain, unspecified: Secondary | ICD-10-CM | POA: Diagnosis not present

## 2020-08-29 DIAGNOSIS — R1084 Generalized abdominal pain: Secondary | ICD-10-CM | POA: Diagnosis not present

## 2020-08-29 DIAGNOSIS — U071 COVID-19: Secondary | ICD-10-CM | POA: Diagnosis not present

## 2020-08-29 DIAGNOSIS — R069 Unspecified abnormalities of breathing: Secondary | ICD-10-CM | POA: Diagnosis not present

## 2020-08-29 NOTE — Telephone Encounter (Signed)
Called to Discuss with patient about Covid symptoms and the use of the monoclonal antibody infusion for those with mild to moderate Covid symptoms and at a high risk of hospitalization.     Pt appears to qualify for this infusion due to co-morbid conditions and/or a member of an at-risk group in accordance with the FDA Emergency Use Authorization. Symptom onset 08/25/20.    Unable to reach pt. Voicemail left and My Chart message sent.   Willette Alma, NP [facility]  [phone]

## 2020-10-02 ENCOUNTER — Ambulatory Visit: Payer: BLUE CROSS/BLUE SHIELD | Admitting: Family Medicine

## 2020-10-03 ENCOUNTER — Other Ambulatory Visit: Payer: Self-pay | Admitting: Family Medicine

## 2020-10-16 ENCOUNTER — Other Ambulatory Visit: Payer: Self-pay | Admitting: Family Medicine

## 2020-11-13 ENCOUNTER — Other Ambulatory Visit: Payer: Self-pay

## 2020-11-13 ENCOUNTER — Ambulatory Visit (INDEPENDENT_AMBULATORY_CARE_PROVIDER_SITE_OTHER): Payer: BLUE CROSS/BLUE SHIELD | Admitting: Family Medicine

## 2020-11-13 DIAGNOSIS — M792 Neuralgia and neuritis, unspecified: Secondary | ICD-10-CM

## 2020-11-13 DIAGNOSIS — M19071 Primary osteoarthritis, right ankle and foot: Secondary | ICD-10-CM

## 2020-11-13 DIAGNOSIS — M19072 Primary osteoarthritis, left ankle and foot: Secondary | ICD-10-CM

## 2020-11-13 DIAGNOSIS — I7300 Raynaud's syndrome without gangrene: Secondary | ICD-10-CM | POA: Diagnosis not present

## 2020-11-13 DIAGNOSIS — E039 Hypothyroidism, unspecified: Secondary | ICD-10-CM | POA: Diagnosis not present

## 2020-11-13 DIAGNOSIS — F988 Other specified behavioral and emotional disorders with onset usually occurring in childhood and adolescence: Secondary | ICD-10-CM

## 2020-11-13 DIAGNOSIS — Z1211 Encounter for screening for malignant neoplasm of colon: Secondary | ICD-10-CM

## 2020-11-13 DIAGNOSIS — D509 Iron deficiency anemia, unspecified: Secondary | ICD-10-CM

## 2020-11-13 DIAGNOSIS — M6740 Ganglion, unspecified site: Secondary | ICD-10-CM

## 2020-11-13 DIAGNOSIS — Z79899 Other long term (current) drug therapy: Secondary | ICD-10-CM

## 2020-11-13 MED ORDER — AMPHETAMINE-DEXTROAMPHETAMINE 20 MG PO TABS: ORAL_TABLET | ORAL | 0 refills | Status: DC

## 2020-11-13 MED ORDER — HYDROCODONE-ACETAMINOPHEN 10-325 MG PO TABS: ORAL_TABLET | ORAL | 0 refills | Status: DC

## 2020-11-13 MED ORDER — BUPROPION HCL ER (SR) 200 MG PO TB12: ORAL_TABLET | ORAL | 5 refills | Status: DC

## 2020-11-13 MED ORDER — LEVOTHYROXINE SODIUM 175 MCG PO TABS
175.0000 ug | ORAL_TABLET | Freq: Every day | ORAL | 5 refills | Status: DC
Start: 2020-11-13 — End: 2021-01-20

## 2020-11-13 MED ORDER — HYDROCODONE-ACETAMINOPHEN 10-325 MG PO TABS: 1.0000 | ORAL_TABLET | ORAL | 0 refills | Status: DC | PRN

## 2020-11-13 NOTE — Progress Notes (Signed)
Subjective:    Patient ID: Bonnie [name], female    DOB: Jan 24, 1971, 49 y.o.   MRN: [id number]  HPI Patient arrives for a follow up on meds. Patient states she is having pain and swelling in feet- needs surgery but that is not an option for her currently. Patient also having arthritis issues in thumbs. Patient also states she has had problems with bowels since having Covid.  Patient presents with multiple health issues going on She has chronic pain in both feet along with burning and to some degree some tingling Her feet are debilitating to her but she tries to keep active as best she can She also has discomfort in both thumbs worse on the right side than the left side and some problems with the pains in her fingers She is due for colonoscopy coming up and has had some problems with her bowel since having Covid no blood  Hypothyroidism, unspecified type - Plan: T4, free, TSH  Primary osteoarthritis of both feet  Raynaud's disease without gangrene  Ganglion cyst  Iron deficiency anemia, unspecified iron deficiency anemia type - Plan: CBC with Differential/Platelet, Ferritin, Iron, Iron Binding Cap (TIBC)([facility])  Neuropathic pain  Attention deficit disorder, unspecified hyperactivity presence  Screen for colon cancer - Plan: Ambulatory referral to Gastroenterology  High risk medication use - Plan: Basic metabolic panel  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 Review of Systems  Constitutional: Negative for activity change and appetite change.  HENT: Negative for congestion and rhinorrhea.   Respiratory: Negative for cough and shortness of breath.   Cardiovascular: Negative for  chest pain and leg swelling.  Gastrointestinal: Negative for abdominal pain, nausea and vomiting.  Musculoskeletal: Positive for arthralgias. Negative for back pain.  Skin: Negative for color change.  Neurological: Negative for dizziness and weakness.  Psychiatric/Behavioral: Negative for agitation and confusion.  This patient has adult ADD. Takes medication responsibly. Medication does help the patient focus in be more functional. Patient relates that they are or not abusing the medication or misusing the medication. The patient understands that if they're having any negative side effects such as elevated high blood pressure severe headaches they would need stop the medication follow-up immediately. They also understand that the prescriptions are to last for 3 months then the patient will need to follow-up before having further prescriptions.  Patient compliance has not been on the medicine recently  Does medication help patient function /attention better does allow her to function better with better attention  Side effects denies side effects  This patient was seen today for chronic pain  The medication list was reviewed and updated.   -Compliance with medication: When she has medicine she is compliant  - Number patient states they take daily: She was taking it 3 times daily but we agreed to reduce it to just twice daily  -when was the last dose patient took?  Earlier this fall  The patient was advised the importance of maintaining medication and not using illegal substances with these.  Here for refills and follow up  The patient was educated that we can provide 3 monthly scripts for their medication, it is their responsibility to follow the instructions.  Side  effects or complications from medications: No side effects from the medication does not cause drowsiness  Patient is aware that pain medications are meant to minimize the severity of the pain to allow their pain levels to  improve to allow for better function. They are aware of that pain medications cannot totally remove their pain.  Due for UDT ( at least once per year) : Once yearly she is already had one for last year we will do one on her next visit  Scale of 1 to 10 ( 1 is least 10 is most) Your pain level without the medicine:9  Your pain level with medication 5  Scale 1 to 10 ( 1-helps very little, 10 helps very well) How well does your pain medication reduce your pain so you can function better through out the day?  8          Objective:   Physical Exam Vitals reviewed.  Constitutional:      General: She is not in acute distress.    Appearance: She is well-nourished.  HENT:     Head: Normocephalic.  Cardiovascular:     Rate and Rhythm: Normal rate and regular rhythm.     Heart sounds: Normal heart sounds. No murmur heard.   Pulmonary:     Effort: Pulmonary effort is normal.     Breath sounds: Normal breath sounds.  Musculoskeletal:        General: No edema.  Lymphadenopathy:     Cervical: No cervical adenopathy.  Neurological:     Mental Status: She is alert.  Psychiatric:        Behavior: Behavior normal.           Assessment & Plan:  1. Hypothyroidism, unspecified type Check lab work continue medication - T4, free - TSH  2. Primary osteoarthritis of both feet Patient is following up with her podiatrist but unfortunately long-term faces long-term pain and discomfort from her feet that unfortunately surgery will not correct pain medication is utilized she denies over utilizing the medicine takes it at typically wound one time or none per day  The patient was seen in followup for chronic pain. A review over at their current pain status was discussed. Drug registry was checked. Prescriptions were given.  Regular follow-up recommended. Discussion was held regarding the importance of compliance with medication as well as pain medication contract.  Patient was informed  that medication may cause drowsiness and should not be combined  with other medications/alcohol or street drugs. If the patient feels medication is causing altered alertness then do not drive or operate dangerous equipment.  Drug registry checked 3 scripts will be sent in  3. Raynaud's disease without gangrene This happened 1 time she will monitor if it starts having frequently she will need to have potential medication avoid cold stimuli  4. Ganglion cyst Small ganglion cyst left wrist if it becomes larger referral to hand specialist  5. Iron deficiency anemia, unspecified iron deficiency anemia type Lab work ordered await the results patient was also encouraged to get colonoscopy and referral to gastroenterology - CBC with Differential/Platelet - Ferritin - Iron - Iron Binding Cap (TIBC)([facility])  6. Neuropathic pain Pain please see above  7. Attention deficit disorder, unspecified hyperactivity presence Patient has adult ADD medication does help her focus and stay more on track she denies abusing it she was getting this through her psychiatrist but is no longer seeing that psychiatrist I have told her that we will agreed to the  medication as 20 mg taken once in the morning once at noon but not 3 times daily  8. Screen for colon cancer Referral to GI as per above - Ambulatory referral to Gastroenterology  9. High risk medication use Labs ordered - Basic metabolic panel Hypothyroidism-check lab work continue medication  Follow-up 3 months

## 2020-11-13 NOTE — Patient Instructions (Signed)
Raynaud Phenomenon ° °Raynaud phenomenon is a condition that affects the blood vessels (arteries) that carry blood to your fingers and toes. The arteries that supply blood to your ears, lips, nipples, or the tip of your nose might also be affected. Raynaud phenomenon causes the arteries to become narrow temporarily (spasm). As a result, the flow of blood to the affected areas is temporarily decreased. This usually occurs in response to cold temperatures or stress. During an attack, the skin in the affected areas turns white, then blue, and finally red. You may also feel tingling or numbness in those areas. °Attacks usually last for only a brief period, and then the blood flow to the area returns to normal. In most cases, Raynaud phenomenon does not cause serious health problems. °What are the causes? °In many cases, the cause of this condition is not known. The condition may occur on its own (primary Raynaud phenomenon) or may be associated with other diseases or factors (secondary Raynaud phenomenon). °Possible causes may include: °· Diseases or medical conditions that damage the arteries. °· Injuries and repetitive actions that hurt the hands or feet. °· Being exposed to certain chemicals. °· Taking medicines that narrow the arteries. °· Other medical conditions, such as lupus, scleroderma, rheumatoid arthritis, thyroid problems, blood disorders, Sjogren syndrome, or atherosclerosis. °What increases the risk? °The following factors may make you more likely to develop this condition: °· Being 20-40 years old. °· Being female. °· Having a family history of Raynaud phenomenon. °· Living in a cold climate. °· Smoking. °What are the signs or symptoms? °Symptoms of this condition usually occur when you are exposed to cold temperatures or when you have emotional stress. The symptoms may last for a few minutes or up to several hours. They usually affect your fingers but may also affect your toes, nipples, lips, ears, or  the tip of your nose. Symptoms may include: °· Changes in skin color. The skin in the affected areas will turn pale or white. The skin may then change from white to bluish to red as normal blood flow returns to the area. °· Numbness, tingling, or pain in the affected areas. °In severe cases, symptoms may include: °· Skin sores. °· Tissues decaying and dying (gangrene). °How is this diagnosed? °This condition may be diagnosed based on: °· Your symptoms and medical history. °· A physical exam. During the exam, you may be asked to put your hands in cold water to check for a reaction to cold temperature. °· Tests, such as: °? Blood tests to check for other diseases or conditions. °? A test to check the movement of blood through your arteries and veins (vascular ultrasound). °? A test in which the skin at the base of your fingernail is examined under a microscope (nailfold capillaroscopy). °How is this treated? °Treatment for this condition often involves making lifestyle changes and taking steps to control your exposure to cold temperatures. For more severe cases, medicine (calcium channel blockers) may be used to improve blood flow. Surgery is sometimes done to block the nerves that control the affected arteries, but this is rare. °Follow these instructions at home: °Avoiding cold temperatures °Take these steps to avoid exposure to cold: °· If possible, stay indoors during cold weather. °· When you go outside during cold weather, dress in layers and wear mittens, a hat, a scarf, and warm footwear. °· Wear mittens or gloves when handling ice or frozen food. °· Use holders for glasses or cans containing cold drinks. °·   Let warm water run for a while before taking a shower or bath. °· Warm up the car before driving in cold weather. °Lifestyle ° °· If possible, avoid stressful and emotional situations. Try to find ways to manage your stress, such as: °? Exercise. °? Yoga. °? Meditation. °? Biofeedback. °· Do not use any  products that contain nicotine or tobacco, such as cigarettes and e-cigarettes. If you need help quitting, ask your health care provider. °· Avoid secondhand smoke. °· Limit your use of caffeine. °? Switch to decaffeinated coffee, tea, and soda. °? Avoid chocolate. °· Avoid vibrating tools and machinery. °General instructions °· Protect your hands and feet from injuries, cuts, or bruises. °· Avoid wearing tight rings or wristbands. °· Wear loose fitting socks and comfortable, roomy shoes. °· Take over-the-counter and prescription medicines only as told by your health care provider. °Contact a health care provider if: °· Your discomfort becomes worse despite lifestyle changes. °· You develop sores on your fingers or toes that do not heal. °· Your fingers or toes turn black. °· You have breaks in the skin on your fingers or toes. °· You have a fever. °· You have pain or swelling in your joints. °· You have a rash. °· Your symptoms occur on only one side of your body. °Summary °· Raynaud phenomenon is a condition that affects the arteries that carry blood to your fingers, toes, ears, lips, nipples, or the tip of your nose. °· In many cases, the cause of this condition is not known. °· Symptoms of this condition include changes in skin color, and numbness and tingling of the affected area. °· Treatment for this condition includes lifestyle changes, reducing exposure to cold temperatures, and using medicines for severe cases of the condition. °· Contact your health care provider if your condition worsens despite treatment. °This information is not intended to replace advice given to you by your health care provider. Make sure you discuss any questions you have with your health care provider. °Document Revised: 11/05/2017 Document Reviewed: 12/14/2016 °Elsevier Patient Education © 2020 [employer] ° °

## 2020-11-18 ENCOUNTER — Encounter: Payer: Self-pay | Admitting: Internal Medicine

## 2020-11-21 ENCOUNTER — Encounter: Payer: Self-pay | Admitting: Family Medicine

## 2020-11-22 NOTE — Telephone Encounter (Signed)
Pt states she vomited 2 times last night and once this morning. Wanted to know if she could get something for nausea and vomiting, no fever, no abd pain, started having a little diarrhea this morning. Not able to eat but keeping down liquids

## 2020-11-22 NOTE — Telephone Encounter (Signed)
Pls call in zofran 4mg  odt, 1 tab p.o. q8hrs prn nausea/vomiting.  #10 no refills.   If not able to keep fluids down, worsening pain, or vomiting, and if not urinating every 2-3 hrs then needing to call office.  May be getting dehydrated and may need IV fluids.   Small sips of water after taking zofran after .  Then if keeping things down then slowly advance fluids from there.   Call if worsening.   Dr. 

## 2020-12-05 ENCOUNTER — Other Ambulatory Visit: Payer: Self-pay | Admitting: Family Medicine

## 2020-12-05 ENCOUNTER — Encounter: Payer: Self-pay | Admitting: Family Medicine

## 2020-12-05 ENCOUNTER — Other Ambulatory Visit: Payer: Self-pay | Admitting: *Deleted

## 2020-12-05 MED ORDER — ONDANSETRON 8 MG PO TBDP: ORAL_TABLET | ORAL | 2 refills | Status: DC

## 2020-12-05 MED ORDER — FERRALET 90 90-1 MG PO TABS: 1.0000 | ORAL_TABLET | Freq: Every day | ORAL | 2 refills | Status: DC

## 2020-12-05 NOTE — Telephone Encounter (Signed)
Hi Nurses  I would recommend Zofran 8 mg ODT, 1 taken 3 times daily as needed for nausea, #12 with 2 refills May send to [facility] on [address] as requested

## 2020-12-25 ENCOUNTER — Encounter: Payer: Self-pay | Admitting: Internal Medicine

## 2020-12-25 ENCOUNTER — Ambulatory Visit: Payer: BLUE CROSS/BLUE SHIELD | Admitting: Gastroenterology

## 2020-12-27 ENCOUNTER — Telehealth: Payer: Self-pay

## 2020-12-27 NOTE — Telephone Encounter (Signed)
Patient called Triage phone asking for an appt with Dr. Cleophas Dunker asap. I don't see anything for quite sometime, so I wanted to send this to you

## 2020-12-27 NOTE — Telephone Encounter (Signed)
Spoke with patient. She made an appointment for [facility] on 01/08/2021.

## 2021-01-07 ENCOUNTER — Encounter: Payer: Self-pay | Admitting: Family Medicine

## 2021-01-07 MED ORDER — HYDROCODONE-ACETAMINOPHEN 10-325 MG PO TABS: ORAL_TABLET | ORAL | 0 refills | Status: DC

## 2021-01-07 NOTE — Addendum Note (Signed)
Addended by: Marlowe Shores on: 01/07/2021 04:52 PM   Modules accepted: Orders

## 2021-01-07 NOTE — Telephone Encounter (Signed)
Nurses May go ahead and do a prescription for the hydrocodone twice daily as needed for pain #60 for filling it on January 07, 2021 Please pend  Also please notify Bonnie [name] she will need to do a follow-up office visit within the next 30 days in order to update her pain management contract and do appropriate urine drug screening which is required by law at least on a yearly basis and she has not had this in a fair amount of time  If there is a problem please let me know

## 2021-01-08 ENCOUNTER — Ambulatory Visit (INDEPENDENT_AMBULATORY_CARE_PROVIDER_SITE_OTHER): Payer: BLUE CROSS/BLUE SHIELD | Admitting: Orthopaedic Surgery

## 2021-01-08 ENCOUNTER — Other Ambulatory Visit: Payer: Self-pay

## 2021-01-08 ENCOUNTER — Ambulatory Visit: Payer: Self-pay

## 2021-01-08 ENCOUNTER — Encounter: Payer: Self-pay | Admitting: Orthopaedic Surgery

## 2021-01-08 VITALS — Ht 66.0 in | Wt 165.0 lb

## 2021-01-08 DIAGNOSIS — M79641 Pain in right hand: Secondary | ICD-10-CM | POA: Diagnosis not present

## 2021-01-08 DIAGNOSIS — M19042 Primary osteoarthritis, left hand: Secondary | ICD-10-CM | POA: Diagnosis not present

## 2021-01-08 DIAGNOSIS — M79642 Pain in left hand: Secondary | ICD-10-CM | POA: Diagnosis not present

## 2021-01-08 DIAGNOSIS — M19041 Primary osteoarthritis, right hand: Secondary | ICD-10-CM

## 2021-01-08 MED ORDER — BUPIVACAINE HCL 0.5 % IJ SOLN
0.5000 mL | INTRAMUSCULAR | Status: AC | PRN
  Administered 2021-01-08: .5 mL

## 2021-01-08 NOTE — Progress Notes (Signed)
Office Visit Note   Patient: Bonnie [name]           Date of Birth: 02-Apr-1971           MRN: [id number] Visit Date: 01/08/2021              Requested by: Bonnie Sciara, MD [address] [place],  [place] [address] PCP: Bonnie Sciara, MD   Assessment & Plan: Visit Diagnoses:  1. Pain in right hand   2. Pain in left hand   3. Primary osteoarthritis of both hands     Plan: Primary osteoarthritis base of both thumbs.  Bonnie [name] is more symptomatic on the right.  Will inject that with betamethasone and Marcaine.  Reevaluate in 2 weeks and consider injecting the left thumb.  Have discussed surgical correction at some point in the future.  Has been using Voltaren gel  Follow-Up Instructions: Return in about 2 weeks (around 01/22/2021).   Orders:  Orders Placed This Encounter  Procedures  . Hand/UE Inj: R thumb CMC  . XR Hand Complete Right  . XR Hand Complete Left   No orders of the defined types were placed in this encounter.     Procedures: Hand/UE Inj: R thumb CMC for osteoarthritis on 01/08/2021 11:31 AM Details: 27 G needle, dorsal approach Medications: 0.5 mL bupivacaine 0.5 %  3 mg betamethasone injected into the carpometacarpal joint right thumb with Marcaine      Clinical Data: No additional findings.   Subjective: Chief Complaint  Patient presents with  . Right Hand - Pain  Patient presents today for right hand pain. She states that she has a history of arthritis. She has seen Bonnie [name] and does not have RA. Her pain is located at her right thumb. She wants to get a cortisone injection today. Over the last few years the pain has worsened.  Has been seen in the past for similar problems with x-rays consistent with osteoarthritis.  No numbness or tingling.  Working as a Sales executive.  Right-hand-dominant  HPI  Review of Systems   Objective: Vital Signs: Ht 5\' 6"  (1.676 m)   Wt 165 lb (74.8 kg)   LMP 05/10/2013   BMI 26.63 kg/m    Physical Exam Constitutional:      Appearance: She is well-developed and well-nourished.  HENT:     Mouth/Throat:     Mouth: Oropharynx is clear and moist.  Eyes:     Extraocular Movements: EOM normal.     Pupils: Pupils are equal, round, and reactive to light.  Pulmonary:     Effort: Pulmonary effort is normal.  Skin:    General: Skin is warm and dry.  Neurological:     Mental Status: She is alert and oriented to person, place, and time.  Psychiatric:        Mood and Affect: Mood and affect normal.        Behavior: Behavior normal.     Ortho Exam awake alert and oriented x3.  Comfortable sitting.  Has lost 70 pounds feeling better.  Right hand with partial subluxation of the thumb metacarpal joint and some mild tenderness.  No redness.  No instability.  Does have some weakened grip.  Left hand with similar problem but more hypertrophic changes and probably a little bit more weakness.  Is right-hand dominant.  Neurologically intact.  Does have some degenerative changes about the metacarpal phalangeal joints good grip and release. does have Heberden's nodes across  the DIP joints also consistent with arthritis  Specialty Comments:  No specialty comments available.  Imaging: XR Hand Complete Left  Result Date: 01/08/2021 Films of the left hand were obtained in several projections.  There is significant arthritis at the base of the thumb at the carpometacarpal joint.  There is ectopic calcification both radially and ulnarly with partial subluxation and narrowing of the joint space.  Films are consistent with advanced osteoarthritis of the basal thumb joint  XR Hand Complete Right  Result Date: 01/08/2021 Films of the right hand were obtained in several projections.  There is narrowing of the joint space at the metacarpal carpal joint and partial subluxation.  Very minimal ectopic calcification compared to the films of the left hand.  Consistent with osteoarthritis the base of the  thumb.  No acute changes    PMFS History: Patient Active Problem List   Diagnosis Date Noted  . Osteoarthritis of both hands 01/08/2021  . Arthritis 02/09/2019  . Encounter for long-term opiate analgesic use 05/17/2018  . DDD (degenerative disc disease), cervical 04/25/2018  . DDD (degenerative disc disease), lumbar 04/25/2018  . Positive ANA (antinuclear antibody) 04/25/2018  . Pain of right hip joint 04/25/2018  . Primary osteoarthritis of both feet 04/25/2018  . Allergic rhinitis due to animal hair and dander 08/04/2017  . Deviated nasal septum 08/04/2017  . Epistaxis, recurrent 08/04/2017  . Tonsillar calculus 08/04/2017  . LLQ pain 11/19/2016  . Bloating 11/19/2016  . Pruritic erythematous rash 09/12/2015  . Big thyroid 03/30/2014  . Dysmetabolic syndrome 03/30/2014  . 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 Morbid obesity (HCC) 10/18/2013  . Hypothyroidism 10/18/2013  . PCOS (polycystic ovarian syndrome) 10/18/2013  . Abdominal pain 09/22/2012  . Rectal bleeding 09/22/2012   Past Medical History:  Diagnosis Date  . Depression   . Dysthymia 1999  . Hypothyroidism   . PCOS (polycystic ovarian syndrome) 2000  . Prediabetes     Family History  Problem Relation Age of Onset  . Diabetes Other   . Hypertension Father   . Diabetes Father   . Cancer Mother        adrenal, lung, pituitary, lymph nodes, ?primary  . Diabetes Brother   . Healthy Daughter   . Colon cancer Neg Hx     Past Surgical History:  Procedure Laterality Date  . CARPAL TUNNEL RELEASE Right 10/17/2015   DR. Haylyn [name]  . CARPAL TUNNEL RELEASE Left   . CESAREAN SECTION    . COSMETIC SURGERY    . DILATION AND CURETTAGE OF UTERUS    . ENDOMETRIAL ABLATION    . FACIAL COSMETIC SURGERY     oralmaxillofacial surgery, broke jaws   . FOOT SURGERY    . NECK SURGERY  May 2006  . WISDOM TOOTH EXTRACTION     Social History   Occupational History  . Occupation: stay at home  Tobacco Use  . Smoking status:  Never Smoker  . Smokeless tobacco: Never Used  Vaping Use  . Vaping Use: Never used  Substance and Sexual Activity  . Alcohol use: No  . Drug use: No  . Sexual activity: Not on file

## 2021-01-17 ENCOUNTER — Encounter: Payer: Self-pay | Admitting: Podiatry

## 2021-01-20 ENCOUNTER — Encounter: Payer: Self-pay | Admitting: Family Medicine

## 2021-01-20 ENCOUNTER — Other Ambulatory Visit: Payer: Self-pay | Admitting: *Deleted

## 2021-01-20 MED ORDER — SYNTHROID 175 MCG PO TABS: 175.0000 ug | ORAL_TABLET | Freq: Every day | ORAL | 5 refills | Status: DC

## 2021-01-20 NOTE — Telephone Encounter (Signed)
Pt states it was sent in for generic and she needs rx to be brand name only. Refill sent in for brand name only and pt was notified.

## 2021-01-22 ENCOUNTER — Ambulatory Visit: Payer: BLUE CROSS/BLUE SHIELD | Admitting: Orthopaedic Surgery

## 2021-01-22 ENCOUNTER — Other Ambulatory Visit: Payer: Self-pay

## 2021-02-11 ENCOUNTER — Ambulatory Visit: Payer: BLUE CROSS/BLUE SHIELD | Admitting: Family Medicine

## 2021-02-11 ENCOUNTER — Encounter: Payer: Self-pay | Admitting: Family Medicine

## 2021-02-11 DIAGNOSIS — E039 Hypothyroidism, unspecified: Secondary | ICD-10-CM | POA: Diagnosis not present

## 2021-02-11 DIAGNOSIS — Z79899 Other long term (current) drug therapy: Secondary | ICD-10-CM | POA: Diagnosis not present

## 2021-02-11 DIAGNOSIS — D509 Iron deficiency anemia, unspecified: Secondary | ICD-10-CM | POA: Diagnosis not present

## 2021-02-12 LAB — CBC WITH DIFFERENTIAL/PLATELET
Basophils Absolute: 0.1 10*3/uL (ref 0.0–0.2)
Basos: 1 %
EOS (ABSOLUTE): 0.5 10*3/uL — ABNORMAL HIGH (ref 0.0–0.4)
Eos: 8 %
Hematocrit: 41.7 % (ref 34.0–46.6)
Hemoglobin: 14.1 g/dL (ref 11.1–15.9)
Immature Grans (Abs): 0 10*3/uL (ref 0.0–0.1)
Immature Granulocytes: 0 %
Lymphocytes Absolute: 2.2 10*3/uL (ref 0.7–3.1)
Lymphs: 34 %
MCH: 31.5 pg (ref 26.6–33.0)
MCHC: 33.8 g/dL (ref 31.5–35.7)
MCV: 93 fL (ref 79–97)
Monocytes Absolute: 0.5 10*3/uL (ref 0.1–0.9)
Monocytes: 8 %
Neutrophils Absolute: 3.2 10*3/uL (ref 1.4–7.0)
Neutrophils: 49 %
Platelets: 258 10*3/uL (ref 150–450)
RBC: 4.47 x10E6/uL (ref 3.77–5.28)
RDW: 11.9 % (ref 11.7–15.4)
WBC: 6.4 10*3/uL (ref 3.4–10.8)

## 2021-02-12 LAB — BASIC METABOLIC PANEL
BUN/Creatinine Ratio: 14 (ref 9–23)
BUN: 15 mg/dL (ref 6–24)
CO2: 24 mmol/L (ref 20–29)
Calcium: 9 mg/dL (ref 8.7–10.2)
Chloride: 102 mmol/L (ref 96–106)
Creatinine, Ser: 1.07 mg/dL — ABNORMAL HIGH (ref 0.57–1.00)
Glucose: 84 mg/dL (ref 65–99)
Potassium: 4.8 mmol/L (ref 3.5–5.2)
Sodium: 139 mmol/L (ref 134–144)
eGFR: 63 mL/min/{1.73_m2} (ref >59)

## 2021-02-12 LAB — T4, FREE: Free T4: 1.66 ng/dL (ref 0.82–1.77)

## 2021-02-12 LAB — IRON AND TIBC
Iron Saturation: 22 % (ref 15–55)
Iron: 74 ug/dL (ref 27–159)
Total Iron Binding Capacity: 331 ug/dL (ref 250–450)
UIBC: 257 ug/dL (ref 131–425)

## 2021-02-12 LAB — TSH: TSH: 0.378 u[IU]/mL — ABNORMAL LOW (ref 0.450–4.500)

## 2021-02-12 LAB — FERRITIN: Ferritin: 75 ng/mL (ref 15–150)

## 2021-02-20 ENCOUNTER — Encounter: Payer: Self-pay | Admitting: Family Medicine

## 2021-02-20 ENCOUNTER — Other Ambulatory Visit: Payer: Self-pay | Admitting: Family Medicine

## 2021-02-20 MED ORDER — AMPHETAMINE-DEXTROAMPHETAMINE 20 MG PO TABS: ORAL_TABLET | ORAL | 0 refills | Status: DC

## 2021-02-25 ENCOUNTER — Ambulatory Visit: Payer: BLUE CROSS/BLUE SHIELD | Admitting: Podiatry

## 2021-03-04 ENCOUNTER — Ambulatory Visit: Payer: BLUE CROSS/BLUE SHIELD | Admitting: Podiatry

## 2021-03-11 ENCOUNTER — Ambulatory Visit: Payer: BLUE CROSS/BLUE SHIELD | Admitting: Family Medicine

## 2021-03-11 ENCOUNTER — Other Ambulatory Visit: Payer: Self-pay

## 2021-03-11 ENCOUNTER — Ambulatory Visit (INDEPENDENT_AMBULATORY_CARE_PROVIDER_SITE_OTHER): Payer: BLUE CROSS/BLUE SHIELD

## 2021-03-11 ENCOUNTER — Ambulatory Visit (INDEPENDENT_AMBULATORY_CARE_PROVIDER_SITE_OTHER): Payer: BLUE CROSS/BLUE SHIELD | Admitting: Podiatry

## 2021-03-11 ENCOUNTER — Encounter: Payer: Self-pay | Admitting: Family Medicine

## 2021-03-11 VITALS — BP 128/74 | Temp 97.4°F | Wt 171.6 lb

## 2021-03-11 DIAGNOSIS — Z79891 Long term (current) use of opiate analgesic: Secondary | ICD-10-CM | POA: Diagnosis not present

## 2021-03-11 DIAGNOSIS — M21621 Bunionette of right foot: Secondary | ICD-10-CM

## 2021-03-11 DIAGNOSIS — M2012 Hallux valgus (acquired), left foot: Secondary | ICD-10-CM

## 2021-03-11 DIAGNOSIS — M2041 Other hammer toe(s) (acquired), right foot: Secondary | ICD-10-CM

## 2021-03-11 DIAGNOSIS — F988 Other specified behavioral and emotional disorders with onset usually occurring in childhood and adolescence: Secondary | ICD-10-CM | POA: Insufficient documentation

## 2021-03-11 DIAGNOSIS — E039 Hypothyroidism, unspecified: Secondary | ICD-10-CM | POA: Diagnosis not present

## 2021-03-11 DIAGNOSIS — M21622 Bunionette of left foot: Secondary | ICD-10-CM

## 2021-03-11 DIAGNOSIS — M2011 Hallux valgus (acquired), right foot: Secondary | ICD-10-CM | POA: Diagnosis not present

## 2021-03-11 DIAGNOSIS — M898X7 Other specified disorders of bone, ankle and foot: Secondary | ICD-10-CM

## 2021-03-11 MED ORDER — HYDROCODONE-ACETAMINOPHEN 10-325 MG PO TABS
ORAL_TABLET | ORAL | 0 refills | Status: DC
Start: 2021-03-11 — End: 2021-06-17

## 2021-03-11 MED ORDER — AMPHETAMINE-DEXTROAMPHETAMINE 20 MG PO TABS: ORAL_TABLET | ORAL | 0 refills | Status: DC

## 2021-03-11 MED ORDER — SYNTHROID 175 MCG PO TABS: ORAL_TABLET | ORAL | 5 refills | Status: DC

## 2021-03-11 MED ORDER — HYDROCODONE-ACETAMINOPHEN 10-325 MG PO TABS: ORAL_TABLET | ORAL | 0 refills | Status: DC

## 2021-03-11 NOTE — Progress Notes (Signed)
Subjective:    Patient ID: Bonnie [name], female    DOB: 16-Aug-1971, 50 y.o.   MRN: [id number]  HPI Patient was seen today for ADD checkup.  This patient does have ADD.  Patient takes medications for this.  If this does help control overall symptoms.  Please see below. -weight, vital signs reviewed.  The following items were covered. -Compliance with medication : Adderall 20 mg one in the morning and one at noon  -Problems with completing homework, paying attention/taking good notes in school: n/a  -grades: n/a  - Eating patterns : eats ok   -sleeping: hasnt sleep well last few nights  -Additional issues or questions: sinus pressure; allergies.   This patient was seen today for chronic pain  The medication list was reviewed and updated.  Location of Pain for which the patient has been treated with regarding narcotics: Neck pain and low back pain  Onset of this pain: Has been going on for many years   -Compliance with medication: Hydrocodone   - Number patient states they take daily: 1-2  -when was the last dose patient took? Over 2 weeks ago   The patient was advised the importance of maintaining medication and not using illegal substances with these.  Here for refills and follow up  The patient was educated that we can provide 3 monthly scripts for their medication, it is their responsibility to follow the instructions.  Side effects or complications from medications: none  Patient is aware that pain medications are meant to minimize the severity of the pain to allow their pain levels to improve to allow for better function. They are aware of that pain medications cannot totally remove their pain.  Due for UDT ( at least once per year) : Later this year in the summer  Scale of 1 to 10 ( 1 is least 10 is most) Your pain level without the medicine: 8 Your pain level with medication 3  Scale 1 to 10 ( 1-helps very little, 10 helps very well) How well does your  pain medication reduce your pain so you can function better through out the day?  8  Quality of the pain: Throbbing aching  Persistence of the pain: All day long work  Modifying factors: Worse with activity  Pt also would like to go over recent lab work.  Labs reviewed Hypothyroidism, unspecified type  Encounter for long-term opiate analgesic use  Attention deficit disorder, unspecified hyperactivity presence     Review of Systems Please see above    Objective:   Physical Exam        Assessment & Plan:  1. Hypothyroidism, unspecified type Adjust thyroid medication recheck TSH again in 3 months   2. Encounter for long-term opiate analgesic use The patient was seen in followup for chronic pain. A review over at their current pain status was discussed. Drug registry was checked. Prescriptions were given.  Regular follow-up recommended. Discussion was held regarding the importance of compliance with medication as well as pain medication contract.  Patient was informed that medication may cause drowsiness and should not be combined  with other medications/alcohol or street drugs. If the patient feels medication is causing altered alertness then do not drive or operate dangerous equipment.  Drug registry checked patient typically uses a half a tablet all the way up to 2 tablets/day 45 tablets should last her monthly will adjust accordingly  3. Attention deficit disorder, unspecified hyperactivity presence Does well with ADD medicine continue this  medication.  Follow-up if any ongoing troubles  Recheck 3 months

## 2021-03-11 NOTE — Progress Notes (Signed)
HPI: 50 y.o. female presenting today for follow-up evaluation of chronic bilateral foot pain.  Patient has a history of foot surgery in 2016 to address hammertoe deformity to the second digit of the right foot.  Patient continues to have severe pain and tenderness associated to the bilateral feet.  Conservative modalities of been unsuccessful despite appropriate shoe gear, anti-inflammatories and resting and icing the foot.    Patient was originally scheduled for surgery in the latter part of last year however she was not in a position to take care of herself postoperatively.  She takes care of her 75 year old daughter, Ava, with severe autism and she is concerned about the postoperative recovery.  However today she states that she is in a position to proceed with surgery and she has help from family to allow herself time for postoperative recovery.  Today she states that her left foot is more painful and symptomatic than the right.  She presents for further treatment evaluation  Past Medical History:  Diagnosis Date  . Depression   . Dysthymia 1999  . Hypothyroidism   . PCOS (polycystic ovarian syndrome) 2000  . Prediabetes      Physical Exam: General: The patient is alert and oriented x3 in no acute distress.  Dermatology: Skin is warm, dry and supple bilateral lower extremities. Negative for open lesions or macerations.  There is a preulcerative hyperkeratotic callus lesion noted to the right foot.  Sensitivity with light touch  Vascular: Palpable pedal pulses bilaterally. No edema or erythema noted. Capillary refill within normal limits.  Neurological: Epicritic and protective threshold grossly intact bilaterally.   Musculoskeletal Exam: Range of motion within normal limits to all pedal and ankle joints bilateral. Muscle strength 5/5 in all groups bilateral.  Clinical evidence of a hallux valgus and tailor's bunionette deformities bilateral with reducible hammertoes 3, 4, 5 with  associated tenderness to palpation.  There is also a dorsal prominence to the left foot that is very symptomatic with palpation.  Dorsal bone spur is palpable  Radiographic Exam:  Normal osseous mineralization. Joint spaces preserved. No fracture/dislocation/boney destruction.  Progressed bunion deformity noted with intermetatarsal angle greater than 15 degrees bilaterally.  Tailor's bunion also noted with increased intermetatarsal angle between the fourth and fifth metatarsals bilaterally.  There is also noted a large metatarsal splay to the bilateral metatarsals.  Adductovarus hammertoe deformity also noted to digits 3, 4, and 5 of the bilateral feet. Lateral view of the left foot also demonstrates dorsal spurring with a prominence to the dorsal aspect of the left foot.  Correlate clinically  Assessment: 1.  Hallux abductovalgus deformity bilateral 2.  Tailor's bunion bilateral 3.  Metatarsal splay bilateral 4.  Benign skin lesion, preulcerative callus right foot 5.  Hammertoes 3, 4, 5 right 6.  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 Patient evaluated.  After discussing in detail the patient's home situation that she now has someone to help assist her postsurgically and also the fact that she cares for her daughter, I do agree she is now in a position to undergo surgery at this time.   2. 2. Today we discussed the conservative versus surgical management of the presenting pathology. The patient opts for surgical management. All possible complications and details of the procedure were explained. All patient questions were answered. No guarantees were expressed or implied. 3.  Surgery would consist of bunionectomy with first metatarsal osteotomy left.  Tailor's bunionectomy with fifth metatarsal osteotomy  left.  Fourth metatarsal Weil osteotomy to realign the fourth metatarsal and close the intermetatarsal angle and correct for the metatarsal splay.  Possible hammertoe  correction digits 3, 4, 5.  Exostectomy dorsal aspect of left foot  4.  Excisional debridement of the preulcerative callus lesion was performed to the right foot using a tissue nipper without incident or bleeding 5.  Return to clinic 1 week postop     [name], DPM [facility]  Dr. [name], DPM    [address] [place], [place] [address]                Office [phone]  Fax [phone]

## 2021-03-19 ENCOUNTER — Telehealth: Payer: Self-pay

## 2021-03-19 NOTE — Telephone Encounter (Signed)
Received surgery paperwork from the [place] office. Left a message for Tameca to call me and schedule surgery with Dr. Logan Bores.

## 2021-03-21 ENCOUNTER — Other Ambulatory Visit: Payer: Self-pay | Admitting: Family Medicine

## 2021-03-21 NOTE — Telephone Encounter (Signed)
03/11/21 was last med check up

## 2021-03-29 ENCOUNTER — Telehealth: Payer: Self-pay | Admitting: Family Medicine

## 2021-03-29 NOTE — Telephone Encounter (Signed)
Nurses I received [facility] from [facility] stating that her last mammogram was October 2017.  She needs a new mammogram.  Please inform her of this.  See if this is something that she would like to set up at the hospital?  Or will she be going back to [facility].  Please document accordingly.

## 2021-03-31 NOTE — Telephone Encounter (Signed)
Left message to return call 

## 2021-04-02 NOTE — Telephone Encounter (Signed)
Left message to return call; also sent my chart message 

## 2021-04-04 NOTE — Telephone Encounter (Signed)
Left message to return call 

## 2021-04-07 ENCOUNTER — Encounter: Payer: Self-pay | Admitting: Family Medicine

## 2021-04-07 ENCOUNTER — Encounter: Payer: Self-pay | Admitting: *Deleted

## 2021-04-07 NOTE — Telephone Encounter (Signed)
3 messages have been left and pt has not returned call. Do you want [place] to keep trying to call her or send a letter?

## 2021-04-07 NOTE — Telephone Encounter (Signed)
It would be fine to send a letter telling the patient that yearly mammogram is recommended and if she is having 1 we would appreciate her having wherever she got it done send [place] a copy thanks No further action after the letter

## 2021-04-15 ENCOUNTER — Telehealth: Payer: Self-pay | Admitting: Urology

## 2021-04-15 NOTE — Telephone Encounter (Signed)
DOS - 05/15/21  [place] BUNIONECTOMY LEFT --- 07622 METATARSAL OSTEO 4,5 LEFT --- 63335 CAPSULOTOMY MPJ 3,4 LEFT --- 45625 HAMMERTOE REPAIR 3,4 LEFT --- 63893   [employer] EFFECTIVE DATE - 09/16/20   PLAN DEDUCTIBLE - $4,500.00 W/ $3,620.92 REMAINING OUT OF POCKET - $8,550.00 W/ $7,342.87 REMAINING COINSURANCE - 50% COPAY - $0.00  NO PRIOR AUTH REQUIRED

## 2021-05-15 ENCOUNTER — Encounter: Payer: Self-pay | Admitting: Podiatry

## 2021-05-15 ENCOUNTER — Other Ambulatory Visit: Payer: Self-pay | Admitting: Podiatry

## 2021-05-15 DIAGNOSIS — M2012 Hallux valgus (acquired), left foot: Secondary | ICD-10-CM | POA: Diagnosis not present

## 2021-05-15 DIAGNOSIS — M85872 Other specified disorders of bone density and structure, left ankle and foot: Secondary | ICD-10-CM | POA: Diagnosis not present

## 2021-05-15 DIAGNOSIS — M21542 Acquired clubfoot, left foot: Secondary | ICD-10-CM | POA: Diagnosis not present

## 2021-05-15 DIAGNOSIS — M21622 Bunionette of left foot: Secondary | ICD-10-CM | POA: Diagnosis not present

## 2021-05-15 DIAGNOSIS — M2042 Other hammer toe(s) (acquired), left foot: Secondary | ICD-10-CM | POA: Diagnosis not present

## 2021-05-15 DIAGNOSIS — M25572 Pain in left ankle and joints of left foot: Secondary | ICD-10-CM | POA: Diagnosis not present

## 2021-05-15 DIAGNOSIS — M25775 Osteophyte, left foot: Secondary | ICD-10-CM | POA: Diagnosis not present

## 2021-05-15 MED ORDER — MELOXICAM 15 MG PO TABS: 15.0000 mg | ORAL_TABLET | Freq: Every day | ORAL | 1 refills | Status: DC

## 2021-05-15 MED ORDER — OXYCODONE-ACETAMINOPHEN 5-325 MG PO TABS: 1.0000 | ORAL_TABLET | ORAL | 0 refills | Status: DC | PRN

## 2021-05-15 NOTE — Progress Notes (Signed)
PRN postop 

## 2021-05-21 ENCOUNTER — Encounter: Payer: Self-pay | Admitting: Podiatry

## 2021-05-21 ENCOUNTER — Telehealth: Payer: Self-pay | Admitting: Podiatry

## 2021-05-22 NOTE — Telephone Encounter (Signed)
Please advise 

## 2021-05-23 ENCOUNTER — Encounter: Payer: Self-pay | Admitting: Podiatry

## 2021-05-23 ENCOUNTER — Ambulatory Visit (INDEPENDENT_AMBULATORY_CARE_PROVIDER_SITE_OTHER): Payer: BLUE CROSS/BLUE SHIELD | Admitting: Podiatry

## 2021-05-23 ENCOUNTER — Ambulatory Visit (INDEPENDENT_AMBULATORY_CARE_PROVIDER_SITE_OTHER): Payer: BLUE CROSS/BLUE SHIELD

## 2021-05-23 ENCOUNTER — Other Ambulatory Visit: Payer: Self-pay | Admitting: Podiatry

## 2021-05-23 ENCOUNTER — Other Ambulatory Visit: Payer: Self-pay

## 2021-05-23 VITALS — BP 133/83 | HR 77 | Temp 97.7°F | Resp 20

## 2021-05-23 DIAGNOSIS — Z9889 Other specified postprocedural states: Secondary | ICD-10-CM

## 2021-05-23 DIAGNOSIS — M25775 Osteophyte, left foot: Secondary | ICD-10-CM | POA: Diagnosis not present

## 2021-05-23 MED ORDER — DOXYCYCLINE HYCLATE 100 MG PO TABS: 100.0000 mg | ORAL_TABLET | Freq: Two times a day (BID) | ORAL | 0 refills | Status: DC

## 2021-05-23 MED ORDER — OXYCODONE-ACETAMINOPHEN 5-325 MG PO TABS: 1.0000 | ORAL_TABLET | ORAL | 0 refills | Status: DC | PRN

## 2021-05-23 NOTE — Telephone Encounter (Signed)
Refill sent.

## 2021-05-23 NOTE — Progress Notes (Signed)
PRN postop 

## 2021-05-23 NOTE — Progress Notes (Signed)
   Subjective:  Patient presents today status post left forefoot reconstructive surgery. DOS: 05/15/2021.  Patient states that she is doing well.  She has been minimally weightbearing in the cam boot.  She presents for further treatment and evaluation  Past Medical History:  Diagnosis Date   Depression    Dysthymia 1999   Hypothyroidism    PCOS (polycystic ovarian syndrome) 2000   Prediabetes       Objective/Physical Exam Neurovascular status intact.  Skin incisions appear to be well coapted with sutures and staples intact.  There is some very mild drainage coming from some of the incision sites.  There is maceration throughout the heel and the lateral aspect of the foot.  Patient states that she has been having hot flashes and sweating a lot.  Mild malodor noted.  Moderate edema noted.  No significant erythema  Radiographic Exam:  Orthopedic hardware and osteotomies sites appear to be stable with routine healing.  Assessment: 1. s/p left forefoot reconstructive surgery. DOS: 05/15/2021   Plan of Care:  1. Patient was evaluated. X-rays reviewed 2.  Dressings changed today.  Patient may begin to air out the foot and allow the foot to dry out when she is nonweightbearing in a recliner with the foot elevated 3.  Ace wraps provided.  Wear daily 4.  Continue minimal weightbearing in the cam boot 5.  Refill prescription for Percocet 5/325 mg sent this morning 6.  Prescription for doxycycline 100 mg 2 times daily #20  7.  Return to clinic 1 week  *Husband's name is Ulysees Barns, DPM [facility]  Dr. [name], DPM    [address] [place], [place] [address]                Office [phone]  Fax [phone]

## 2021-05-26 ENCOUNTER — Encounter: Payer: Self-pay | Admitting: Podiatry

## 2021-05-27 ENCOUNTER — Other Ambulatory Visit: Payer: Self-pay | Admitting: Podiatry

## 2021-05-27 MED ORDER — OXYCODONE-ACETAMINOPHEN 5-325 MG PO TABS: 1.0000 | ORAL_TABLET | ORAL | 0 refills | Status: DC | PRN

## 2021-05-27 NOTE — Telephone Encounter (Signed)
Called and asked for a refill on pain med to be sent to [facility] [phone]

## 2021-05-30 ENCOUNTER — Encounter: Payer: BLUE CROSS/BLUE SHIELD | Admitting: Podiatry

## 2021-06-03 ENCOUNTER — Telehealth: Payer: Self-pay

## 2021-06-03 ENCOUNTER — Other Ambulatory Visit: Payer: Self-pay | Admitting: Podiatry

## 2021-06-03 ENCOUNTER — Encounter: Payer: Self-pay | Admitting: Podiatry

## 2021-06-03 MED ORDER — OXYCODONE-ACETAMINOPHEN 5-325 MG PO TABS: 1.0000 | ORAL_TABLET | ORAL | 0 refills | Status: DC | PRN

## 2021-06-03 NOTE — Telephone Encounter (Signed)
Rx sent today. - Dr. Logan Bores

## 2021-06-03 NOTE — Telephone Encounter (Signed)
Pt would like refill for pain meds send to [facility]

## 2021-06-03 NOTE — Telephone Encounter (Signed)
Please advise 

## 2021-06-06 ENCOUNTER — Other Ambulatory Visit: Payer: Self-pay

## 2021-06-06 ENCOUNTER — Encounter: Payer: Self-pay | Admitting: Podiatry

## 2021-06-06 ENCOUNTER — Ambulatory Visit (INDEPENDENT_AMBULATORY_CARE_PROVIDER_SITE_OTHER): Payer: BLUE CROSS/BLUE SHIELD | Admitting: Podiatry

## 2021-06-06 VITALS — Temp 97.4°F

## 2021-06-06 DIAGNOSIS — Z9889 Other specified postprocedural states: Secondary | ICD-10-CM

## 2021-06-06 MED ORDER — OXYCODONE-ACETAMINOPHEN 5-325 MG PO TABS: 1.0000 | ORAL_TABLET | Freq: Four times a day (QID) | ORAL | 0 refills | Status: DC | PRN

## 2021-06-09 NOTE — Progress Notes (Signed)
   Subjective:  Patient presents today status post left forefoot reconstructive surgery. DOS: 05/15/2021.  Patient states that she is doing well.  She has been minimally weightbearing in the cam boot.  She presents for further treatment and evaluation  Past Medical History:  Diagnosis Date   Depression    Dysthymia 1999   Hypothyroidism    PCOS (polycystic ovarian syndrome) 2000   Prediabetes       Objective/Physical Exam Neurovascular status intact.  Skin incisions appear to be well coapted with sutures and staples intact.  There is some very mild drainage coming from some of the incision sites.  There is maceration throughout the heel and the lateral aspect of the foot.  Patient states that she has been having hot flashes and sweating a lot.  Mild malodor noted.  Moderate edema noted.  No significant erythema    Assessment: 1. s/p left forefoot reconstructive surgery. DOS: 05/15/2021   Plan of Care:  1. Patient was evaluated. X-rays reviewed 2.  Sutures and staples were removed today 3.  Silvadene cream was provided to apply to the incision sites daily 4.  Return to clinic in 3 weeks for percutaneous fixation pin removal and follow-up x-rays  *Husband's name is Ulysees Barns, DPM [facility]  Dr. [name], DPM    [address] [place], [place] [address]                Office [phone]  Fax [phone]

## 2021-06-13 ENCOUNTER — Encounter: Payer: BLUE CROSS/BLUE SHIELD | Admitting: Podiatry

## 2021-06-13 NOTE — Telephone Encounter (Unsigned)
Pt. Called and asked if she can have pain medications refilled

## 2021-06-16 ENCOUNTER — Encounter: Payer: Self-pay | Admitting: Podiatry

## 2021-06-17 ENCOUNTER — Other Ambulatory Visit: Payer: Self-pay | Admitting: Podiatry

## 2021-06-17 MED ORDER — OXYCODONE-ACETAMINOPHEN 5-325 MG PO TABS: 1.0000 | ORAL_TABLET | Freq: Four times a day (QID) | ORAL | 0 refills | Status: DC | PRN

## 2021-06-27 ENCOUNTER — Ambulatory Visit (INDEPENDENT_AMBULATORY_CARE_PROVIDER_SITE_OTHER): Payer: BLUE CROSS/BLUE SHIELD

## 2021-06-27 ENCOUNTER — Ambulatory Visit (INDEPENDENT_AMBULATORY_CARE_PROVIDER_SITE_OTHER): Payer: BLUE CROSS/BLUE SHIELD | Admitting: Podiatry

## 2021-06-27 ENCOUNTER — Other Ambulatory Visit: Payer: Self-pay

## 2021-06-27 DIAGNOSIS — Z9889 Other specified postprocedural states: Secondary | ICD-10-CM | POA: Diagnosis not present

## 2021-06-27 DIAGNOSIS — R600 Localized edema: Secondary | ICD-10-CM

## 2021-06-27 NOTE — Progress Notes (Signed)
   Subjective:  Patient presents today status post left forefoot reconstructive surgery. DOS: 05/15/2021.  Patient states that she is doing well.  She has been minimally weightbearing in the cam boot.  She presents for further treatment and evaluation  Past Medical History:  Diagnosis Date   Depression    Dysthymia 1999   Hypothyroidism    PCOS (polycystic ovarian syndrome) 2000   Prediabetes       Objective/Physical Exam Neurovascular status intact.  Skin incisions appear to be well coapted with and healed.  Moderate edema noted throughout the forefoot.  No erythema.  No clinical evidence of infection.  Radiographic exam Orthopedic hardware intact.  Osteotomies appear stable with good healing.  No significant change from last visit.  Percutaneous fixation pins removed    Assessment: 1. s/p left forefoot reconstructive surgery. DOS: 05/15/2021 2.  Edema surgical foot   Plan of Care:  1. Patient was evaluated. X-rays reviewed 2.  Percutaneous fixation pins were removed today 3.  Patient may now transition out of the cam boot into good supportive tennis shoes after 3-5 days of wearing the Unna boot 4.  Multilayer Unna boot compression wrap was applied to the surgical extremity due to the increased edema because the patient's increased activity 5.  Return to clinic in 4 weeks  *Husband's name is Ulysees Barns, DPM [facility]  Dr. [name], DPM    [address] [place], [place] [address]                Office [phone]  Fax [phone]

## 2021-06-29 ENCOUNTER — Encounter: Payer: Self-pay | Admitting: Podiatry

## 2021-07-01 ENCOUNTER — Other Ambulatory Visit: Payer: Self-pay | Admitting: Family Medicine

## 2021-07-01 ENCOUNTER — Telehealth: Payer: Self-pay | Admitting: Family Medicine

## 2021-07-01 ENCOUNTER — Other Ambulatory Visit: Payer: Self-pay | Admitting: Podiatry

## 2021-07-01 MED ORDER — OXYCODONE-ACETAMINOPHEN 5-325 MG PO TABS: 1.0000 | ORAL_TABLET | Freq: Four times a day (QID) | ORAL | 0 refills | Status: DC | PRN

## 2021-07-01 MED ORDER — AMPHETAMINE-DEXTROAMPHETAMINE 20 MG PO TABS: ORAL_TABLET | ORAL | 0 refills | Status: DC

## 2021-07-01 NOTE — Telephone Encounter (Signed)
Please advise. Thank you

## 2021-07-01 NOTE — Telephone Encounter (Signed)
Patient called in requesting an appt and refill on her Adderall. I have scheduled her for 07/15/21 however she is out of medication. She uses [facility] on [address]  CB# [phone]

## 2021-07-01 NOTE — Telephone Encounter (Signed)
Refill of medication was sent in keep follow-up visit

## 2021-07-02 DIAGNOSIS — Z9889 Other specified postprocedural states: Secondary | ICD-10-CM | POA: Insufficient documentation

## 2021-07-02 DIAGNOSIS — Z01419 Encounter for gynecological examination (general) (routine) without abnormal findings: Secondary | ICD-10-CM | POA: Diagnosis not present

## 2021-07-02 DIAGNOSIS — Z1231 Encounter for screening mammogram for malignant neoplasm of breast: Secondary | ICD-10-CM | POA: Diagnosis not present

## 2021-07-02 DIAGNOSIS — Z6830 Body mass index (BMI) 30.0-30.9, adult: Secondary | ICD-10-CM | POA: Diagnosis not present

## 2021-07-07 ENCOUNTER — Telehealth: Payer: Self-pay

## 2021-07-07 ENCOUNTER — Encounter: Payer: Self-pay | Admitting: Podiatry

## 2021-07-07 ENCOUNTER — Encounter: Payer: Self-pay | Admitting: Family Medicine

## 2021-07-07 NOTE — Telephone Encounter (Signed)
Patient called the [facility] nurse line and left a message on Friday 07/04/21 @2 :39pm. She reported itching, swelling and hives developing from [facility] boot. Please call

## 2021-07-08 ENCOUNTER — Other Ambulatory Visit: Payer: Self-pay | Admitting: Podiatry

## 2021-07-08 ENCOUNTER — Other Ambulatory Visit: Payer: Self-pay | Admitting: Family Medicine

## 2021-07-08 MED ORDER — AMPHETAMINE-DEXTROAMPHETAMINE 20 MG PO TABS: ORAL_TABLET | ORAL | 0 refills | Status: DC

## 2021-07-08 MED ORDER — OXYCODONE-ACETAMINOPHEN 5-325 MG PO TABS: 1.0000 | ORAL_TABLET | Freq: Four times a day (QID) | ORAL | 0 refills | Status: DC | PRN

## 2021-07-08 NOTE — Telephone Encounter (Signed)
Please tell the patient to just remove the unna boot wrap. - Dr. Logan Bores

## 2021-07-09 ENCOUNTER — Encounter: Payer: Self-pay | Admitting: Podiatry

## 2021-07-09 DIAGNOSIS — M2550 Pain in unspecified joint: Secondary | ICD-10-CM | POA: Diagnosis not present

## 2021-07-09 DIAGNOSIS — M79672 Pain in left foot: Secondary | ICD-10-CM | POA: Diagnosis not present

## 2021-07-09 DIAGNOSIS — M25561 Pain in right knee: Secondary | ICD-10-CM | POA: Diagnosis not present

## 2021-07-09 DIAGNOSIS — M79671 Pain in right foot: Secondary | ICD-10-CM | POA: Diagnosis not present

## 2021-07-09 DIAGNOSIS — M25522 Pain in left elbow: Secondary | ICD-10-CM | POA: Diagnosis not present

## 2021-07-09 DIAGNOSIS — M79642 Pain in left hand: Secondary | ICD-10-CM | POA: Diagnosis not present

## 2021-07-09 DIAGNOSIS — M1990 Unspecified osteoarthritis, unspecified site: Secondary | ICD-10-CM | POA: Diagnosis not present

## 2021-07-09 DIAGNOSIS — M25562 Pain in left knee: Secondary | ICD-10-CM | POA: Diagnosis not present

## 2021-07-09 DIAGNOSIS — M25422 Effusion, left elbow: Secondary | ICD-10-CM | POA: Diagnosis not present

## 2021-07-09 DIAGNOSIS — M79641 Pain in right hand: Secondary | ICD-10-CM | POA: Diagnosis not present

## 2021-07-15 ENCOUNTER — Ambulatory Visit (INDEPENDENT_AMBULATORY_CARE_PROVIDER_SITE_OTHER): Payer: BLUE CROSS/BLUE SHIELD | Admitting: Family Medicine

## 2021-07-15 ENCOUNTER — Encounter: Payer: Self-pay | Admitting: Family Medicine

## 2021-07-15 ENCOUNTER — Other Ambulatory Visit: Payer: Self-pay

## 2021-07-15 VITALS — BP 118/68 | Temp 97.7°F | Wt 178.8 lb

## 2021-07-15 DIAGNOSIS — F988 Other specified behavioral and emotional disorders with onset usually occurring in childhood and adolescence: Secondary | ICD-10-CM | POA: Diagnosis not present

## 2021-07-15 DIAGNOSIS — E039 Hypothyroidism, unspecified: Secondary | ICD-10-CM | POA: Diagnosis not present

## 2021-07-15 MED ORDER — HYDROCODONE-ACETAMINOPHEN 10-325 MG PO TABS: ORAL_TABLET | ORAL | 0 refills | Status: DC

## 2021-07-15 MED ORDER — AMPHETAMINE-DEXTROAMPHETAMINE 20 MG PO TABS: ORAL_TABLET | ORAL | 0 refills | Status: DC

## 2021-07-15 NOTE — Progress Notes (Signed)
Subjective:    Patient ID: Bonnie [name], female    DOB: Sep 14, 1971, 50 y.o.   MRN: [id number]  HPI Patient was seen today for ADD checkup.  This patient does have ADD.  Patient takes medications for this.  If this does help control overall symptoms.  Please see below. -weight, vital signs reviewed.  The following items were covered. -Compliance with medication : Adderall 20 mg one in the AM and one at noon  -Problems with completing homework, paying attention/taking good notes in school: n/a  -grades: n/a  - Eating patterns : doing well  -sleeping: not sleeping well  -Additional issues or questions: would like to speak with provider regarding Oxycodone   This patient was seen today for chronic pain  The medication list was reviewed and updated.  Location of Pain for which the patient has been treated with regarding narcotics: Patient has longstanding pain in her hands and her feet currently being evaluated for rheumatoid arthritis Recently went through surgery in her right foot has a walking boot Will be facing surgery in the left foot in the near future currently taking oxycodone for this  Onset of this pain: Has had chronic pain for years   -Compliance with medication: Through the years she has been good about limiting hydrocodone to no more than 2/day  - Number patient states they take daily: Currently taking oxycodone 5 mg 4 tablets a day  -when was the last dose patient took?  Earlier today  The patient was advised the importance of maintaining medication and not using illegal substances with these.  Here for refills and follow up  The patient was educated that we can provide 3 monthly scripts for their medication, it is their responsibility to follow the instructions.  Side effects or complications from medications: Denies side effects  Patient is aware that pain medications are meant to minimize the severity of the pain to allow their pain levels to improve  to allow for better function. They are aware of that pain medications cannot totally remove their pain.  Due for UDT ( at least once per year) : We talked at length about all of this we will do a trial of hydrocodone to get her away from the oxycodone if she does well over this over the next 2 weeks we will do formal consultation on follow-up in [redacted] weeks along with urine drug screen  Scale of 1 to 10 ( 1 is least 10 is most) Your pain level without the medicine: 9 Your pain level with medication 4  Scale 1 to 10 ( 1-helps very little, 10 helps very well) How well does your pain medication reduce your pain so you can function better through out the day? 8  Quality of the pain: Aching throbbing burning  Persistence of the pain: 24 hours a day  Modifying factors: Worse with activity    Will try to transition patient back over to hydrocodone and away from oxycodone.   Review of Systems     Objective:   Physical Exam General-in no acute distress Eyes-no discharge Lungs-respiratory rate normal, CTA CV-no murmurs,RRR Extremities skin warm dry no edema Neuro grossly normal Behavior normal, alert        Assessment & Plan:  1. Hypothyroidism, unspecified type Check thyroid functions within the next 30 days continue current medication - TSH - T4, free  2. Attention deficit disorder, unspecified hyperactivity presence The patient was seen today as part of the visit regarding ADD.  Patient is stable on current regimen.  Appropriate prescriptions prescribed.  Medications were reviewed with the patient as well as compliance. Side effects were checked for. Discussion regarding effectiveness was held. Prescriptions were electronically sent in.  Patient reminded to follow-up in approximately 3 months.   [facility] law with drug registry was checked and verified while present with the patient. Drug registry checked Patient benefits from medicine Continue current  medication  Chronic pain of her joints she is under the care of rheumatology they believe she may have rheumatoid arthritis she will let us know more and have them send records  She does have chronic pain of her joints as well as chronic pain in her feet from recent surgeries She is currently taking oxycodone 5 mg 4 times daily We discussed stepping this down We will reduce it to the hydrocodone 10 mg/325 no more than 2 tablets daily I encouraged her to reduce this to half tablet 4 times daily I recommend that she does a follow-up visit with [place] in 2 weeks and if this is going well then we will prescribe this medication ongoing  Down the road if she does have foot surgery she may need to be back on the oxycodone temporarily.  I have encouraged the patient to avoid being on oxycodone long-term because of addictive properties of oxycodone is greater then her current hydrocodone

## 2021-07-15 NOTE — Patient Instructions (Signed)

## 2021-07-29 ENCOUNTER — Encounter: Payer: Self-pay | Admitting: Family Medicine

## 2021-07-29 ENCOUNTER — Ambulatory Visit: Payer: BLUE CROSS/BLUE SHIELD | Admitting: Family Medicine

## 2021-07-31 DIAGNOSIS — E039 Hypothyroidism, unspecified: Secondary | ICD-10-CM | POA: Diagnosis not present

## 2021-08-01 ENCOUNTER — Ambulatory Visit (INDEPENDENT_AMBULATORY_CARE_PROVIDER_SITE_OTHER): Payer: BLUE CROSS/BLUE SHIELD | Admitting: Podiatry

## 2021-08-01 ENCOUNTER — Other Ambulatory Visit: Payer: Self-pay

## 2021-08-01 ENCOUNTER — Ambulatory Visit (INDEPENDENT_AMBULATORY_CARE_PROVIDER_SITE_OTHER): Payer: BLUE CROSS/BLUE SHIELD

## 2021-08-01 DIAGNOSIS — Z9889 Other specified postprocedural states: Secondary | ICD-10-CM | POA: Diagnosis not present

## 2021-08-01 DIAGNOSIS — M2041 Other hammer toe(s) (acquired), right foot: Secondary | ICD-10-CM | POA: Diagnosis not present

## 2021-08-01 DIAGNOSIS — M2011 Hallux valgus (acquired), right foot: Secondary | ICD-10-CM

## 2021-08-01 LAB — T4, FREE: Free T4: 1.23 ng/dL (ref 0.82–1.77)

## 2021-08-01 LAB — TSH: TSH: 3.19 u[IU]/mL (ref 0.450–4.500)

## 2021-08-04 ENCOUNTER — Telehealth: Payer: Self-pay

## 2021-08-04 NOTE — Telephone Encounter (Signed)
Received surgery paperwork from the [place] office. Left a message for Bonnie [name] to call me to schedule surgery.

## 2021-08-06 DIAGNOSIS — F909 Attention-deficit hyperactivity disorder, unspecified type: Secondary | ICD-10-CM | POA: Diagnosis not present

## 2021-08-06 DIAGNOSIS — F32A Depression, unspecified: Secondary | ICD-10-CM | POA: Diagnosis not present

## 2021-08-06 DIAGNOSIS — N959 Unspecified menopausal and perimenopausal disorder: Secondary | ICD-10-CM | POA: Diagnosis not present

## 2021-08-07 ENCOUNTER — Other Ambulatory Visit: Payer: Self-pay | Admitting: Family Medicine

## 2021-08-08 NOTE — Progress Notes (Signed)
   Subjective: 50 y.o. female presenting today status post left forefoot reconstructive surgery.  DOS: 05/15/2021.  Patient states that her left foot is doing very well.  She is walking and ambulating and doing most of her activities with minimal pain.  She continues to have little bit of swelling to the area.  Today she would like to address having surgery and proceeding with right foot surgery.  She states that she has continued to have pain and tenderness to the right foot and would like to have it surgically corrected similar to the left foot.  She presents today for surgical consult and further treatment evaluation  Past Medical History:  Diagnosis Date   Depression    Dysthymia 1999   Hypothyroidism    PCOS (polycystic ovarian syndrome) 2000   Prediabetes     Objective: Physical Exam General: The patient is alert and oriented x3 in no acute distress.  Dermatology: Skin is cool, dry and supple bilateral lower extremities. Negative for open lesions or macerations.  Vascular: Palpable pedal pulses bilaterally. No edema or erythema noted. Capillary refill within normal limits.  Neurological: Epicritic and protective threshold grossly intact bilaterally.   Musculoskeletal Exam: Clinical evidence of bunion deformity noted to the respective foot. There is moderate pain on palpation range of motion of the first MPJ. Lateral deviation of the hallux noted consistent with hallux abductovalgus. Hammertoe contracture also noted on clinical exam to digits 3, 4, 5 of the right foot. Symptomatic pain on palpation and range of motion also noted to the metatarsal phalangeal joints of the respective hammertoe digits.    Radiographic Exam RT foot taken 03/11/2021: Increased intermetatarsal angle greater than 15 with a hallux abductus angle greater than 30 noted on AP view. Moderate degenerative changes noted within the first MPJ. Contracture deformity also noted to the interphalangeal joints and MPJs of  the digits of the respective hammertoes.  Intermetatarsal splay also noted to the digits.  There is dorsal dislocation of the third digit.    Assessment: 1. HAV w/ bunion deformity right 2. Hammertoe deformity 3, 4, 5 right 3.  Splay deformity right forefoot 4.  S/P left forefoot reconstructive surgery.  DOS: 05/15/2021   Plan of Care:  1. Patient was evaluated. X-Rays reviewed. 2. Today we discussed the conservative versus surgical management of the presenting pathology. The patient opts for surgical management. All possible complications and details of the procedure were explained. All patient questions were answered. No guarantees were expressed or implied. 3. Authorization for surgery was initiated today. Surgery will consist of bunionectomy with osteotomy right.  Possible PIPJ arthroplasty 3, 4, right with MTPJ capsulotomy.  Osteotomy fourth metatarsal right with medial translocation of the capital fragment.  Tailor's bunionectomy with fifth metatarsal osteotomy right. 4.  As far as the left foot, patient may resume full activity no restrictions  5.  Return to clinic 1 week postop    [name], DPM [facility]  Dr. [name], DPM    [address]                                        [place], [place] [address]                Office [phone]  Fax [phone]

## 2021-09-01 ENCOUNTER — Encounter: Payer: Self-pay | Admitting: Podiatry

## 2021-09-01 ENCOUNTER — Other Ambulatory Visit: Payer: Self-pay | Admitting: Podiatry

## 2021-09-01 MED ORDER — OXYCODONE-ACETAMINOPHEN 5-325 MG PO TABS: 1.0000 | ORAL_TABLET | Freq: Two times a day (BID) | ORAL | 0 refills | Status: DC | PRN

## 2021-09-01 NOTE — Progress Notes (Signed)
PRN foot pain 

## 2021-09-05 ENCOUNTER — Telehealth: Payer: Self-pay | Admitting: Urology

## 2021-09-05 NOTE — Telephone Encounter (Signed)
DOS - 09/25/21  [place] BUNIONECTOMY RIGHT --- 62376 MET OSTEO 4,5 RIGHT --- 28315 CAPSULOTOMY MPJ RELEASE 3-5 RIGHT --- 17616 HAMMERTOE REPAIR 3-5 RIGHT --- 07371  [employer] EFFECTIVE DATE - 09/16/20   PLAN DEDUCTIBLE - $4,500.00 W/ $0.00 REMAINING OUT OF POCKET - $8,550.0 W/ $0.00 REMAINING COINSURANCE - 50% COPAY - $0.00     NO PRIOR AUTH IS REQUIRED

## 2021-09-12 ENCOUNTER — Encounter: Payer: Self-pay | Admitting: Podiatry

## 2021-09-25 ENCOUNTER — Other Ambulatory Visit: Payer: Self-pay | Admitting: Podiatry

## 2021-09-25 DIAGNOSIS — M2011 Hallux valgus (acquired), right foot: Secondary | ICD-10-CM | POA: Diagnosis not present

## 2021-09-25 DIAGNOSIS — M21541 Acquired clubfoot, right foot: Secondary | ICD-10-CM | POA: Diagnosis not present

## 2021-09-25 DIAGNOSIS — M7751 Other enthesopathy of right foot: Secondary | ICD-10-CM | POA: Diagnosis not present

## 2021-09-25 DIAGNOSIS — M2041 Other hammer toe(s) (acquired), right foot: Secondary | ICD-10-CM | POA: Diagnosis not present

## 2021-09-25 MED ORDER — MELOXICAM 15 MG PO TABS: 15.0000 mg | ORAL_TABLET | Freq: Every day | ORAL | 1 refills | Status: DC

## 2021-09-25 MED ORDER — DOXYCYCLINE HYCLATE 100 MG PO TABS: 100.0000 mg | ORAL_TABLET | Freq: Two times a day (BID) | ORAL | 0 refills | Status: DC

## 2021-09-25 MED ORDER — OXYCODONE-ACETAMINOPHEN 5-325 MG PO TABS: 1.0000 | ORAL_TABLET | ORAL | 0 refills | Status: DC | PRN

## 2021-09-25 NOTE — Progress Notes (Signed)
PRN postop 

## 2021-10-01 ENCOUNTER — Other Ambulatory Visit: Payer: Self-pay | Admitting: Family Medicine

## 2021-10-03 ENCOUNTER — Encounter: Payer: Self-pay | Admitting: Podiatry

## 2021-10-03 ENCOUNTER — Ambulatory Visit (INDEPENDENT_AMBULATORY_CARE_PROVIDER_SITE_OTHER): Payer: BLUE CROSS/BLUE SHIELD

## 2021-10-03 ENCOUNTER — Ambulatory Visit (INDEPENDENT_AMBULATORY_CARE_PROVIDER_SITE_OTHER): Payer: BLUE CROSS/BLUE SHIELD | Admitting: Podiatry

## 2021-10-03 ENCOUNTER — Other Ambulatory Visit: Payer: Self-pay

## 2021-10-03 DIAGNOSIS — Z9889 Other specified postprocedural states: Secondary | ICD-10-CM

## 2021-10-03 MED ORDER — OXYCODONE-ACETAMINOPHEN 5-325 MG PO TABS: 1.0000 | ORAL_TABLET | Freq: Four times a day (QID) | ORAL | 0 refills | Status: DC | PRN

## 2021-10-03 NOTE — Progress Notes (Signed)
   Subjective:  Patient presents today status post right forefoot reconstruction. DOS: 09/25/2021.  Patient states that she is doing well.  Pain is tolerable.  She is kept the dressings clean dry and intact.  She is requesting a taller cam boot because the short cam boot is painful.  She presents for further treatment evaluation  Past Medical History:  Diagnosis Date   Depression    Dysthymia 1999   Hypothyroidism    PCOS (polycystic ovarian syndrome) 2000   Prediabetes       Objective/Physical Exam Neurovascular status intact.  Skin incisions appear to be well coapted with sutures and staples intact. No sign of infectious process noted. No dehiscence. No active bleeding noted. Moderate edema noted to the surgical extremity.  Radiographic Exam:  Orthopedic hardware and osteotomies sites appear to be stable with routine healing.  Assessment: 1. s/p right forefoot reconstruction. DOS: 09/25/2021   Plan of Care:  1. Patient was evaluated. X-rays reviewed 2.  Dressings changed.  Clean dry and intact x1 week 3.  Continue the prophylactic antibiotics that were prescribed until completed 4.  Tolerate cam boot was dispensed.  Continue minimal weightbearing with the knee scooter 5.  Return to clinic in 1 week   [name], DPM [facility]  Dr. [name], DPM    [address] [place], [place] [address]                Office [phone]  Fax [phone]

## 2021-10-12 ENCOUNTER — Encounter: Payer: Self-pay | Admitting: Podiatry

## 2021-10-13 ENCOUNTER — Other Ambulatory Visit: Payer: Self-pay | Admitting: Podiatry

## 2021-10-13 MED ORDER — OXYCODONE-ACETAMINOPHEN 5-325 MG PO TABS
1.0000 | ORAL_TABLET | Freq: Four times a day (QID) | ORAL | 0 refills | Status: DC | PRN
Start: 2021-10-13 — End: 2021-10-24

## 2021-10-13 NOTE — Telephone Encounter (Signed)
Please advise 

## 2021-10-14 ENCOUNTER — Ambulatory Visit (INDEPENDENT_AMBULATORY_CARE_PROVIDER_SITE_OTHER): Payer: BLUE CROSS/BLUE SHIELD | Admitting: Podiatry

## 2021-10-14 ENCOUNTER — Encounter: Payer: BLUE CROSS/BLUE SHIELD | Admitting: Podiatry

## 2021-10-14 ENCOUNTER — Other Ambulatory Visit: Payer: Self-pay

## 2021-10-14 DIAGNOSIS — Z9889 Other specified postprocedural states: Secondary | ICD-10-CM

## 2021-10-14 NOTE — Progress Notes (Signed)
   Subjective:  Patient presents today status post right forefoot reconstruction. DOS: 09/25/2021.  Patient states that she is doing well.  Pain is tolerable.  She is kept the dressings clean dry and intact.  Patient states that the cam boot has helped significantly.  Overall she is doing well with improvement of pain and no new complaints at this time  Past Medical History:  Diagnosis Date   Depression    Dysthymia 1999   Hypothyroidism    PCOS (polycystic ovarian syndrome) 2000   Prediabetes       Objective/Physical Exam Neurovascular status intact.  Skin incisions appear to be well coapted with sutures and staples intact. No sign of infectious process noted. No dehiscence. No active bleeding noted. Moderate edema noted to the surgical extremity.   Assessment: 1. s/p right forefoot reconstruction. DOS: 09/25/2021   Plan of Care:  1. Patient was evaluated.  2.  Sutures and staples removed today 3.  Patient may begin washing and showering and getting the foot wet 4.  Continue minimal weightbearing in the cam boot 5.  Return to clinic in 2 weeks for percutaneous pin removal   [name], DPM [facility]  Dr. [name], DPM    [address] [place], [place] [address]                Office [phone]  Fax [phone]

## 2021-10-16 ENCOUNTER — Encounter: Payer: Self-pay | Admitting: Podiatry

## 2021-10-17 ENCOUNTER — Other Ambulatory Visit: Payer: Self-pay | Admitting: Podiatry

## 2021-10-17 MED ORDER — HYDROXYZINE PAMOATE 25 MG PO CAPS: 25.0000 mg | ORAL_CAPSULE | Freq: Three times a day (TID) | ORAL | 0 refills | Status: DC | PRN

## 2021-10-17 NOTE — Progress Notes (Signed)
PRN itching postop

## 2021-10-20 ENCOUNTER — Other Ambulatory Visit: Payer: Self-pay | Admitting: Podiatry

## 2021-10-20 MED ORDER — FLUCONAZOLE 150 MG PO TABS: 150.0000 mg | ORAL_TABLET | Freq: Once | ORAL | 0 refills | Status: AC

## 2021-10-20 NOTE — Progress Notes (Signed)
PRN antibiotic induced yeast infection postop

## 2021-10-24 ENCOUNTER — Other Ambulatory Visit: Payer: Self-pay | Admitting: Podiatry

## 2021-10-24 MED ORDER — OXYCODONE-ACETAMINOPHEN 5-325 MG PO TABS
1.0000 | ORAL_TABLET | Freq: Four times a day (QID) | ORAL | 0 refills | Status: DC | PRN
Start: 2021-10-24 — End: 2021-11-09

## 2021-10-24 NOTE — Progress Notes (Signed)
PRN postop 

## 2021-10-28 ENCOUNTER — Other Ambulatory Visit: Payer: Self-pay

## 2021-10-28 ENCOUNTER — Encounter: Payer: Self-pay | Admitting: Podiatry

## 2021-10-28 ENCOUNTER — Ambulatory Visit (INDEPENDENT_AMBULATORY_CARE_PROVIDER_SITE_OTHER): Payer: 59 | Admitting: Podiatry

## 2021-10-28 ENCOUNTER — Ambulatory Visit (INDEPENDENT_AMBULATORY_CARE_PROVIDER_SITE_OTHER): Payer: 59

## 2021-10-28 DIAGNOSIS — Z9889 Other specified postprocedural states: Secondary | ICD-10-CM | POA: Diagnosis not present

## 2021-10-28 MED ORDER — DOXYCYCLINE HYCLATE 100 MG PO TABS: 100.0000 mg | ORAL_TABLET | Freq: Two times a day (BID) | ORAL | 0 refills | Status: DC

## 2021-10-28 MED ORDER — FLUCONAZOLE 150 MG PO TABS: 150.0000 mg | ORAL_TABLET | Freq: Once | ORAL | 0 refills | Status: AC

## 2021-10-28 NOTE — Telephone Encounter (Signed)
Saw patient today in office. - Dr. Logan Bores

## 2021-10-28 NOTE — Telephone Encounter (Signed)
Please advise 

## 2021-10-28 NOTE — Progress Notes (Signed)
° °  Subjective:  Patient presents today status post right forefoot reconstruction. DOS: 09/25/2021.  Patient continues to do well.  She is nonweightbearing in the cam boot as instructed.  No new complaints at this time.  Past Medical History:  Diagnosis Date   Depression    Dysthymia 1999   Hypothyroidism    PCOS (polycystic ovarian syndrome) 2000   Prediabetes       Objective/Physical Exam Neurovascular status intact.  Skin incisions appear to be well coapted and healed.  No dehiscence. No active bleeding noted. Moderate edema noted to the surgical extremity.  There is some interdigital maceration with superficial skin breakdown to the third and fourth digits of the right foot.  Serous drainage.   Assessment: 1. s/p right forefoot reconstruction. DOS: 09/25/2021   Plan of Care:  1. Patient was evaluated.  Percutaneous fixation pins were removed today.  X-rays reviewed 2.  Patient may continue weightbearing in the cam boot x3 weeks 3.  Due to the maceration around the third and fourth digits of the foot and concern for possible superficial infection prescription for doxycycline 100 mg 2 times daily.  Patient has a history of yeast infections with antibiotics.  Prescription for Diflucan  4.  Castellani paint provided for the patient to apply to the interdigital areas daily  5.  Return to clinic in 4 weeks for follow-up x-rays and to transition the patient out of the cam boot   [name], DPM [facility]  Dr. [name], DPM    [address] [place], [place] [address]                Office [phone]  Fax [phone]

## 2021-10-31 ENCOUNTER — Encounter: Payer: Self-pay | Admitting: Podiatry

## 2021-11-04 ENCOUNTER — Encounter: Payer: Self-pay | Admitting: Podiatry

## 2021-11-09 ENCOUNTER — Other Ambulatory Visit: Payer: Self-pay | Admitting: Podiatry

## 2021-11-09 MED ORDER — OXYCODONE-ACETAMINOPHEN 5-325 MG PO TABS: 1.0000 | ORAL_TABLET | Freq: Three times a day (TID) | ORAL | 0 refills | Status: DC | PRN

## 2021-11-09 MED ORDER — FLUCONAZOLE 150 MG PO TABS: 150.0000 mg | ORAL_TABLET | Freq: Once | ORAL | 0 refills | Status: AC

## 2021-11-12 ENCOUNTER — Encounter: Payer: Self-pay | Admitting: Family Medicine

## 2021-11-12 NOTE — Telephone Encounter (Signed)
Nurses As for the arm tablet prescription may change it to #30 with 6 refills  As for the ADD medicine may pend send through prescription refills and I will sign off on it  Also please send Selena Batten a message-need to schedule follow-up visit given that ADD medicine is a controlled medicine requires regular visits-every 3 months although we can stretch it to 4 months-it has been 4 months-so must schedule appointment in January in order to continue with the prescription medicine  Thanks-Dr. Lorin Picket

## 2021-11-19 MED ORDER — FERRALET 90 90-1 MG PO TABS: 1.0000 | ORAL_TABLET | Freq: Every day | ORAL | 5 refills | Status: DC

## 2021-11-19 MED ORDER — AMPHETAMINE-DEXTROAMPHETAMINE 20 MG PO TABS: ORAL_TABLET | ORAL | 0 refills | Status: DC

## 2021-11-28 ENCOUNTER — Ambulatory Visit (INDEPENDENT_AMBULATORY_CARE_PROVIDER_SITE_OTHER): Payer: 59 | Admitting: Podiatry

## 2021-11-28 ENCOUNTER — Encounter: Payer: Self-pay | Admitting: Podiatry

## 2021-11-28 ENCOUNTER — Ambulatory Visit (INDEPENDENT_AMBULATORY_CARE_PROVIDER_SITE_OTHER): Payer: 59

## 2021-11-28 ENCOUNTER — Other Ambulatory Visit: Payer: Self-pay

## 2021-11-28 DIAGNOSIS — Z9889 Other specified postprocedural states: Secondary | ICD-10-CM

## 2021-11-28 DIAGNOSIS — M2041 Other hammer toe(s) (acquired), right foot: Secondary | ICD-10-CM

## 2021-11-28 MED ORDER — OXYCODONE-ACETAMINOPHEN 5-325 MG PO TABS: 1.0000 | ORAL_TABLET | Freq: Three times a day (TID) | ORAL | 0 refills | Status: DC | PRN

## 2021-11-28 NOTE — Progress Notes (Addendum)
° °  Subjective:  Patient presents today status post right forefoot reconstruction. DOS: 09/25/2021.  Patient states that she has been wearing the cam boot as instructed.  She has had some swelling to the surgical forefoot.  She also wears Ace wraps daily.  She presents for follow-up treatment and evaluation  Past Medical History:  Diagnosis Date   Depression    Dysthymia 1999   Hypothyroidism    PCOS (polycystic ovarian syndrome) 2000   Prediabetes       Objective/Physical Exam Neurovascular status intact.  Skin incisions healed.  There continues to be moderate edema noted to the surgical extremity.  There are some tenderness to palpation along the first metatarsal of the right foot.  Radiographic exam: Unfortunately there is translocation of the first metatarsal osteotomy site and distal head of the first metatarsal is noted with movement of the orthopedic screws.  The distal portion of the first metatarsal appears to be rotating slowly progressing over the past few months as noted on last x-rays on 10/28/2021.  Is been slow progressive movement.  Osteotomy sites to the lesser metatarsals appear stable with good routine healing.   Assessment: 1. s/p right forefoot reconstruction. DOS: 09/25/2021   Plan of Care:  1. Patient was evaluated. 2.  Today I had a very detailed discussion with the patient regarding the movement of the osteotomy site to the first metatarsal.  Showed the patient today's x-rays as well as previous x-rays and the slow progression and movement of the osteotomy site.  Advised the patient to be off of her foot and mostly nonweightbearing is much as reasonably possible.  Clinically the foot looks good with no significant prominence to the metatarsal head.  Good alignment of the toe and first ray clinically. 3.  Continue cam boot 4.  Compression ankle sleeve dispensed.  Wear daily 5.  Refill prescription for Percocet 5/3 2 5  mg every 8 hours #20  6.  Order placed for  knee scooter.  Recommend that the patient is nonweightbearing is much as reasonably possible to allow for healing and reduce movement to the osteotomy site to the first metatarsal   Edrick Kins, DPM [facility]  Dr. Edrick Kins, DPM    [address] [place], [place] [address]                Office [phone]  Fax [phone]

## 2021-11-28 NOTE — Addendum Note (Signed)
Addended by: [name] on: 11/28/2021 11:57 AM   Modules accepted: Orders

## 2021-12-03 ENCOUNTER — Encounter: Payer: Self-pay | Admitting: Podiatry

## 2021-12-04 ENCOUNTER — Other Ambulatory Visit: Payer: Self-pay | Admitting: Podiatry

## 2021-12-04 ENCOUNTER — Telehealth: Payer: Self-pay | Admitting: *Deleted

## 2021-12-04 MED ORDER — OXYCODONE-ACETAMINOPHEN 5-325 MG PO TABS: 1.0000 | ORAL_TABLET | Freq: Four times a day (QID) | ORAL | 0 refills | Status: DC | PRN

## 2021-12-04 NOTE — Progress Notes (Signed)
PRN postop pain 

## 2021-12-04 NOTE — Telephone Encounter (Signed)
I left Bonnie [name] a message that she can get a knee scooter at [employer] which is located at [address] or [facility] [employer] [facility] [employer] which is located at [employer]. [address].  I informed her that they can rent one to her or she can purchase one.  I also informed her that she can go online and see if she can find one.  I told her she can come by the office to get the prescription is she would like.

## 2021-12-04 NOTE — Telephone Encounter (Signed)
-----   Message from [name], DPM sent at 11/28/2021 11:38 AM EST ----- Regarding: Knee scooter Patient needs a knee scooter.  I placed the order in the patient's chart.  Can we please arrange this for her.  Thanks, Dr. Logan Bores

## 2021-12-10 ENCOUNTER — Ambulatory Visit: Payer: 59 | Admitting: Family Medicine

## 2021-12-10 ENCOUNTER — Encounter: Payer: Self-pay | Admitting: Family Medicine

## 2021-12-10 ENCOUNTER — Ambulatory Visit (INDEPENDENT_AMBULATORY_CARE_PROVIDER_SITE_OTHER): Payer: 59 | Admitting: Family Medicine

## 2021-12-10 ENCOUNTER — Other Ambulatory Visit: Payer: Self-pay

## 2021-12-10 VITALS — BP 138/83 | HR 91 | Ht 66.0 in | Wt 195.0 lb

## 2021-12-10 DIAGNOSIS — M19071 Primary osteoarthritis, right ankle and foot: Secondary | ICD-10-CM | POA: Diagnosis not present

## 2021-12-10 DIAGNOSIS — Z79891 Long term (current) use of opiate analgesic: Secondary | ICD-10-CM | POA: Diagnosis not present

## 2021-12-10 DIAGNOSIS — E039 Hypothyroidism, unspecified: Secondary | ICD-10-CM

## 2021-12-10 DIAGNOSIS — F988 Other specified behavioral and emotional disorders with onset usually occurring in childhood and adolescence: Secondary | ICD-10-CM

## 2021-12-10 DIAGNOSIS — M19072 Primary osteoarthritis, left ankle and foot: Secondary | ICD-10-CM

## 2021-12-10 MED ORDER — AMPHETAMINE-DEXTROAMPHETAMINE 20 MG PO TABS: ORAL_TABLET | ORAL | 0 refills | Status: DC

## 2021-12-10 MED ORDER — HYDROCODONE-ACETAMINOPHEN 10-325 MG PO TABS
1.0000 | ORAL_TABLET | Freq: Three times a day (TID) | ORAL | 0 refills | Status: DC | PRN
Start: 2021-12-10 — End: 2021-12-16

## 2021-12-10 NOTE — Progress Notes (Signed)
° °  Subjective:    Patient ID: Bonnie [name], female    DOB: [birth year], 51 y.o.   MRN: [id number]  HPI  Patient wants to discuss pain management. Patient is currently under the care of her podiatrist for pain management but should be released from him soon and want to discuss coming back here for pain management.   This patient had surgery on her foot.  Unfortunately it seems to be not taking the way it should.  It appears to be drifting and she may end up having to have more surgery because of a nonunion  She does take oxycodone for pain taking 2 or 3/day but she would like to shift back to hydrocodone 10 mg she states that did well for her pain and did not cause any drowsiness  Patient also has ADD attention deficit problem she has had this for years benefits with the medicine is requesting refills as well  She does not abuse medicine drug registry checked  She also takes her thyroid medicine on a regular basis.        Review of Systems     Objective:   Physical Exam General-in no acute distress Eyes-no discharge Lungs-respiratory rate normal, CTA CV-no murmurs,RRR Extremities skin warm dry no edema Neuro grossly normal Behavior normal, alert She is wearing a postop shoe on the right side       Assessment & Plan:  1. Hypothyroidism, unspecified type Continue thyroid medicine check lab work at least yearly  2. Primary osteoarthritis of both feet It looks like she may end up having to have more surgery on the left foot.  She states the oxycodone helps with the pain but she like to get away from the oxycodone  She states it would be better to have her pain management through our practice.  Previously we had her on hydrocodone that seemed to do well she did not abuse it she would take 2 or 3/day.  We will do a 1 week trial of hydrocodone she will give [place] feedback if that is working if it is working Issue and 30-day supply have the patient follow-up for follow-up  regarding pain management and yearly urine drug screen  We did go over the particulars of pain management agreement including that the medication is to take the edge off the pain will not totally take it away.  She is not to combine it with medicines other than what we prescribed.  She is to stop her oxycodone.  If for some reason she feels drowsy she is not to operate a motor vehicle or any dangerous equipment.  She understands that pain medicine is optional if she does not feel that it is helping her she should let us know and stop the medicine  3. Encounter for long-term opiate analgesic use Please see above  4. Attention deficit disorder, unspecified hyperactivity presence 3 scripts given Drug registry checked She states it does help her stay focused denies abusing the medicine.

## 2021-12-12 ENCOUNTER — Encounter: Payer: Self-pay | Admitting: Family Medicine

## 2021-12-15 ENCOUNTER — Telehealth: Payer: Self-pay | Admitting: Family Medicine

## 2021-12-15 ENCOUNTER — Encounter: Payer: Self-pay | Admitting: Family Medicine

## 2021-12-16 ENCOUNTER — Other Ambulatory Visit: Payer: Self-pay | Admitting: Family Medicine

## 2021-12-16 MED ORDER — HYDROCODONE-ACETAMINOPHEN 10-325 MG PO TABS: ORAL_TABLET | ORAL | 0 refills | Status: DC

## 2021-12-23 ENCOUNTER — Other Ambulatory Visit: Payer: Self-pay | Admitting: Family Medicine

## 2021-12-24 ENCOUNTER — Other Ambulatory Visit: Payer: Self-pay | Admitting: Family Medicine

## 2021-12-24 MED ORDER — SYNTHROID 175 MCG PO TABS: ORAL_TABLET | ORAL | 5 refills | Status: DC

## 2021-12-25 ENCOUNTER — Other Ambulatory Visit: Payer: Self-pay | Admitting: Family Medicine

## 2021-12-25 ENCOUNTER — Telehealth: Payer: Self-pay | Admitting: Family Medicine

## 2021-12-25 MED ORDER — AMPHETAMINE-DEXTROAMPHETAMINE 20 MG PO TABS: ORAL_TABLET | ORAL | 0 refills | Status: DC

## 2021-12-25 NOTE — Telephone Encounter (Signed)
So I can guarantee you that this is going to run into an issue with the patient.  I would recommend that you inform the patient that her insurance company is stating that she has to try the generic.  Nurses-we can try to get a prior approval for namebrand but most insurance companies will insist on and require generic medications because previous studies have shown those to be 95% effective.  I certainly know that the patient will state that the generic does not work for her.  Apparently in the past we have tried levothyroxine generic and she stated did not do as well.   But-once again we cannot [employer].  So if the patient wants [place] to try to get her prior approval we can try but we offer no guarantees.  Coverage of medications is strictly a decision between the insurance company and the patient and is not something we can rewrite.

## 2021-12-25 NOTE — Telephone Encounter (Signed)
PA attempted for Synthroid 175 mcg tablet; insurance has denied it due to synthroid is excluded under the members prescription drug plan. Covered meds include Armour Thyroid, Euthyrox or Levothyroxine if appropriate. Please advise. Thank you

## 2021-12-26 ENCOUNTER — Encounter: Payer: Self-pay | Admitting: Podiatry

## 2021-12-26 ENCOUNTER — Other Ambulatory Visit: Payer: Self-pay

## 2021-12-26 ENCOUNTER — Ambulatory Visit (INDEPENDENT_AMBULATORY_CARE_PROVIDER_SITE_OTHER): Payer: 59

## 2021-12-26 ENCOUNTER — Ambulatory Visit (INDEPENDENT_AMBULATORY_CARE_PROVIDER_SITE_OTHER): Payer: 59 | Admitting: Podiatry

## 2021-12-26 DIAGNOSIS — Z9889 Other specified postprocedural states: Secondary | ICD-10-CM | POA: Diagnosis not present

## 2021-12-27 NOTE — Telephone Encounter (Signed)
So if we have tried to get prior approval have not been successful in doing so-I believe the patient is up against a nonmovable [employer].  FDA studies do show that generics are effective and with thyroid we typically monitor TSH and free T4 to make sure that the proper amount is being utilized.  I sympathize but I do not know any additional things that we can do thank you

## 2021-12-29 ENCOUNTER — Encounter: Payer: Self-pay | Admitting: Family Medicine

## 2021-12-29 NOTE — Telephone Encounter (Signed)
Patient advised per Dr Nicki Reaper: We have tried to get prior approval and have not been successful in doing so-Dr Nicki Reaper believes the patient is up against a nonmovable [employer].  FDA studies do show that generics are effective and with thyroid we typically monitor TSH and free T4 to make sure that the proper amount is being utilized.  Dr Nicki Reaper sympathizes but does not know any additional things that we can do Patient verbalized understanding.

## 2021-12-29 NOTE — Telephone Encounter (Signed)
Left message to return call 

## 2022-01-05 NOTE — Progress Notes (Signed)
° °  Subjective:  Patient presents today status post right forefoot reconstruction. DOS: 09/25/2021.  Patient states that she has been wearing the cam boot as instructed.  She has had some swelling to the surgical forefoot.  She also wears Ace wraps daily.  She presents for follow-up treatment and evaluation  Past Medical History:  Diagnosis Date   Depression    Dysthymia 1999   Hypothyroidism    PCOS (polycystic ovarian syndrome) 2000   Prediabetes       Objective/Physical Exam Neurovascular status intact.  Skin incisions healed.  Today there is minimal edema noted to the foot.  Slight tenderness to palpation.  Minimal pain with range of motion of the first MTP joint  Radiographic exam: Fragmentation of the osteotomy site to the first ray with displacement of the capital fragment.  Osteotomy sites to the lesser metatarsals appear stable with good routine healing.  Assessment: 1. s/p right forefoot reconstruction. DOS: 09/25/2021 2.  Displacement of the capital fragment of the first metatarsal osteotomy site   Plan of Care:  1. Patient was evaluated. 2.  The patient is now 3 months postop. She would really like to get out of the cam boot.  Recommend that if she does discontinue the cam boot to be minimal weightbearing is much as possible to the foot 3.  Recommend good supportive tennis shoes 4.  We did discuss possible revisional first ray surgery which would include first MTP arthrodesis.  The patient would like to think about this 5.  Return to clinic in 6 weeks for follow-up x-ray   Edrick Kins, DPM [facility]  Dr. Edrick Kins, DPM    [address] [place], [place] [address]                Office [phone]  Fax [phone]

## 2022-01-14 ENCOUNTER — Other Ambulatory Visit: Payer: Self-pay | Admitting: Family Medicine

## 2022-01-14 ENCOUNTER — Encounter: Payer: Self-pay | Admitting: Family Medicine

## 2022-01-14 MED ORDER — HYDROCODONE-ACETAMINOPHEN 10-325 MG PO TABS: ORAL_TABLET | ORAL | 0 refills | Status: DC

## 2022-01-14 NOTE — Telephone Encounter (Signed)
Nurses-I did send in her prescription to [facility].  It did have a fill date for today. ? ?Please work with the patient to have her come by to give a urine for urine drug screen.  This could be today or tomorrow. ? ?Patient needs to go ahead and schedule an office visit for late March for pain management visit thank you with me ?

## 2022-01-16 ENCOUNTER — Encounter: Payer: Self-pay | Admitting: Family Medicine

## 2022-01-18 NOTE — Telephone Encounter (Signed)
Please send her a reminder to come this week for urine drug test mandatory thank you ?

## 2022-01-20 ENCOUNTER — Encounter: Payer: Self-pay | Admitting: Podiatry

## 2022-02-05 ENCOUNTER — Other Ambulatory Visit: Payer: Self-pay | Admitting: Family Medicine

## 2022-02-06 ENCOUNTER — Ambulatory Visit (INDEPENDENT_AMBULATORY_CARE_PROVIDER_SITE_OTHER): Payer: 59 | Admitting: Podiatry

## 2022-02-06 ENCOUNTER — Other Ambulatory Visit: Payer: Self-pay

## 2022-02-06 ENCOUNTER — Encounter: Payer: Self-pay | Admitting: Podiatry

## 2022-02-06 ENCOUNTER — Ambulatory Visit (INDEPENDENT_AMBULATORY_CARE_PROVIDER_SITE_OTHER): Payer: 59

## 2022-02-06 DIAGNOSIS — M7741 Metatarsalgia, right foot: Secondary | ICD-10-CM | POA: Diagnosis not present

## 2022-02-06 DIAGNOSIS — Z9889 Other specified postprocedural states: Secondary | ICD-10-CM

## 2022-02-08 NOTE — Telephone Encounter (Signed)
If the patient is still taking this may have 6 refills ?

## 2022-02-09 ENCOUNTER — Other Ambulatory Visit: Payer: 59

## 2022-02-10 ENCOUNTER — Telehealth: Payer: Self-pay | Admitting: *Deleted

## 2022-02-10 NOTE — Telephone Encounter (Signed)
Insurance denied name brand only Synthroid. Insurance stated they will only pay for the brand name only Synthroid when the the patient has documented significant allergy or serious adverse reaction to the generic equivalent. Patient was notified and stated she will pay out of pocket for the brand name synthroid because it makes her feel better than the generic. ?

## 2022-02-10 NOTE — Telephone Encounter (Signed)
So noted 

## 2022-02-15 NOTE — Progress Notes (Signed)
? ?  Subjective:  ?Patient presents today status post right forefoot reconstruction. DOS: 09/25/2021.  Patient has been wearing tennis shoes.  She states that she continues to get some mild swelling to the foot with some associated pain depending on her activity.  No new complaints at this time ? ?Past Medical History:  ?Diagnosis Date  ? Depression   ? Dysthymia 1999  ? Hypothyroidism   ? PCOS (polycystic ovarian syndrome) 2000  ? Prediabetes   ? ?  ? ?Objective/Physical Exam ?Neurovascular status intact.  Skin incisions healed.  There continues to be minimal edema.  Slight tenderness with palpation range of motion of the first MTP joint.  Overall clinically the foot appears to be doing well.  All skin incisions healed. ? ?Radiographic exam today, 02/06/2022 RT foot: ?Fragmentation of the osteotomy site to the first ray with displacement of the capital fragment appears to be consolidating and healing.  There is improvement since prior x-rays were taken.  All of the hardware and osteotomies are stable with routine healing ? ?Assessment: ?1. s/p right forefoot reconstruction. DOS: 09/25/2021 ?2.  Displacement of the capital fragment of the first metatarsal osteotomy site ? ? ?Plan of Care:  ?1. Patient was evaluated. ?2.  The patient has been in tennis shoes with mild to moderate pain.  For now we are going to simply observe over the next 4 weeks.  If there is no significant improvement we will likely need to return to the OR for revisional first MTP arthrodesis of the toe. ?3.  Compression ankle sleeve dispensed.  Wear daily  ?4.  Return to clinic 1 month ? ?[name], DPM ?[facility] ? ?Dr. [name], DPM  ?  ?[address]                                    ?[place], [place] [address]                ?Office [phone]  ?Fax [phone] ? ? ? ? ? ?

## 2022-02-16 DIAGNOSIS — N959 Unspecified menopausal and perimenopausal disorder: Secondary | ICD-10-CM | POA: Diagnosis not present

## 2022-02-23 ENCOUNTER — Other Ambulatory Visit: Payer: 59

## 2022-02-26 ENCOUNTER — Encounter: Payer: Self-pay | Admitting: Family Medicine

## 2022-02-26 NOTE — Telephone Encounter (Signed)
Hi Bonnie [name] ? ?My apologies for the issues regarding the forms. ? ?We try the best we can to keep up with not only forms from [employer] but forms from multiple other patients.  At times it is very difficult to address all of these various forms as quickly as we would like to.  As for the G-tube I had previously filled out that form and when it came back to me again I had concerns whether or not they were going to cover this so I placed that onto the form but you are correct she is no longer on a G-tube so therefore we cannot use that as an indication. ? ?Our nurse-Autumn-brought me a copy of the form for [place] to correct and resubmit.  I will work together with the nurses to make sure that this is sent in ASAP. ? ?#1 I apologize for not filling out the form quicker ?#2 I apologize for including the G-tube aspect ?#3 we will try to do better in the future ? ?We can have our social worker look into seeing if there are any funds that can help you.  But if you need temporary assistance I will be happy to also try to help you financially. ? ?I appreciate your understanding. ? ?Sincerely ?Lilyan Punt MD ? ? ?

## 2022-03-03 ENCOUNTER — Encounter: Payer: Self-pay | Admitting: Podiatry

## 2022-03-11 LAB — COLOGUARD

## 2022-03-13 ENCOUNTER — Ambulatory Visit: Payer: 59 | Admitting: Family Medicine

## 2022-03-20 ENCOUNTER — Other Ambulatory Visit: Payer: 59

## 2022-09-03 ENCOUNTER — Encounter: Payer: Self-pay | Admitting: Family Medicine

## 2022-09-03 NOTE — Telephone Encounter (Signed)
It would be fine to send in her thyroid medicine and her iron tablets to [facility].  As for the ADD medicine she needs to do in person visit this is a controlled medicine we have to meet the state guidelines of seeing people in person for this type of stuff  As for the thyroid unfortunately I cannot control the cost at that.  I know she says that she cannot take the generic but it is reasonable to consider trying it again because generic would certainly be a whole lot cheaper most insurance companies nowadays do not pay for name brand Synthroid But of course if she does not want to try that again she can certainly continue to do the name brand thyroid but we have no control over the cost She can certainly try good Rx Am sympathetic to her situation but there are absolutely no easy resolutions to this

## 2022-09-06 ENCOUNTER — Other Ambulatory Visit: Payer: Self-pay | Admitting: Family Medicine

## 2022-09-06 MED ORDER — AMPHETAMINE-DEXTROAMPHETAMINE 20 MG PO TABS: ORAL_TABLET | ORAL | 0 refills | Status: DC

## 2022-09-06 NOTE — Telephone Encounter (Signed)
Nurses-30-day prescription for ADD medicine was sent in.  Patient will need to do a in person visit before further refills can be made.

## 2022-09-07 ENCOUNTER — Encounter: Payer: Self-pay | Admitting: Family Medicine

## 2022-09-07 ENCOUNTER — Encounter: Payer: Self-pay | Admitting: Podiatry

## 2022-09-09 NOTE — Telephone Encounter (Signed)
Nurses-please check to see if we have any samples of Ensure or coupons for Ensure.  Please forward what you know to Bonnie [name]  I was able to read through Kim's message-certainly has been a pleasure taking care of both Bonnie [name] and her daughter through these years.  Anything we can do to help them out lets do.  Also social services possibly will have additional aid they can give but I am uncertain what they have available. Thanks-Dr. Nicki Reaper

## 2022-09-18 ENCOUNTER — Ambulatory Visit (INDEPENDENT_AMBULATORY_CARE_PROVIDER_SITE_OTHER): Payer: Self-pay | Admitting: Nurse Practitioner

## 2022-09-18 VITALS — BP 109/73 | HR 73 | Temp 98.1°F | Ht 66.0 in | Wt 199.0 lb

## 2022-09-18 DIAGNOSIS — D509 Iron deficiency anemia, unspecified: Secondary | ICD-10-CM

## 2022-09-18 DIAGNOSIS — F988 Other specified behavioral and emotional disorders with onset usually occurring in childhood and adolescence: Secondary | ICD-10-CM

## 2022-09-18 DIAGNOSIS — E039 Hypothyroidism, unspecified: Secondary | ICD-10-CM

## 2022-09-18 LAB — POCT HEMOGLOBIN: Hemoglobin: 13.7 g/dL (ref 11–14.6)

## 2022-09-18 MED ORDER — AMPHETAMINE-DEXTROAMPHETAMINE 20 MG PO TABS: ORAL_TABLET | ORAL | 0 refills | Status: DC

## 2022-09-18 MED ORDER — SYNTHROID 175 MCG PO TABS: ORAL_TABLET | ORAL | 5 refills | Status: DC

## 2022-09-18 NOTE — Progress Notes (Unsigned)
Subjective:    Patient ID: Bonnie [name], female    DOB: 12-15-1970, 51 y.o.   MRN: [id number]  HPI This patient has adult ADD. Takes medication responsibly. Medication does help the patient focus in be more functional. Patient relates that they are or not abusing the medication or misusing the medication. The patient understands that if they're having any negative side effects such as elevated high blood pressure severe headaches they would need stop the medication follow-up immediately. They also understand that the prescriptions are to last for 3 months then the patient will need to follow-up before having further prescriptions.  Patient compliance daily  Does medication help patient function /attention better  yes  Side effects  no   HPI: Return medication management visit to refill Adderall. States has taken Adderall for many years but no longer seeing Psych providers. Patient also inquiring again about alternative to generic Synthroid. Reports not taking iron supplement in 3 months because insurance will not cover. States feeling increased fatigue. Describes sleep as "naps off and on all night". Husband works 3rd shift which also makes it hard for her to sleep at night. Denies headaches. Denies palpitations. Denies signs and symptoms of depression. Recovering from BIL foot surgeries. Does not take anything for pain. 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 Has not taken iron for over 3 mo. Balance issues described since surgery. Feet numb since foot operations.    Review of Systems  Constitutional:  Positive for fatigue. Negative for activity change, appetite change, chills and fever.  Eyes:  Negative for visual disturbance.  Respiratory:  Negative for chest tightness and shortness of breath.   Cardiovascular:  Negative for chest pain and palpitations.  Gastrointestinal:  Negative for  constipation, diarrhea, nausea and vomiting.  Musculoskeletal:  Positive for arthralgias.  Neurological:  Negative for seizures and light-headedness.  Psychiatric/Behavioral:  Positive for sleep disturbance. Negative for agitation. The patient is not nervous/anxious.    Flowsheet Row Office Visit from 09/18/2022 in [facility]  PHQ-9 Total Score 1             Objective:   Vitals:   09/18/22 1459  BP: 109/73  Pulse: 73  Temp: 98.1 F (36.7 C)  Height: 5\' 6"  (1.676 m)  Weight: 90.3 kg  SpO2: 97%  BMI (Calculated): 32.13     Physical Exam Constitutional:      General: She is not in acute distress.    Appearance: Normal appearance. She is not ill-appearing.  Cardiovascular:     Rate and Rhythm: Regular rhythm.     Pulses: Normal pulses.     Heart sounds: Normal heart sounds. No murmur heard.    No gallop.  Pulmonary:     Effort: Pulmonary effort is normal.     Breath sounds: Normal breath sounds. No wheezing.  Skin:    General: Skin is warm and dry.  Neurological:     General: No focal deficit present.     Mental Status: She is alert and oriented to person, place, and time.  Psychiatric:        Mood and Affect: Mood normal.        Behavior: Behavior normal.        Judgment: Judgment normal.        Assessment & Plan:  Plan: Hgb POCT. Reviewed resolved lab history of anemia with patient and advised to  start taking daily MVI. Adderall and Sythroid reordered. Medication warning signs reviewed and patient verbalized understanding.  Results for orders placed or performed in visit on 09/18/22  POCT hemoglobin  Result Value Ref Range   Hemoglobin 13.7 11 - 14.6 g/dL    Return in about 3 months (around 12/19/2022).

## 2022-09-20 ENCOUNTER — Encounter: Payer: Self-pay | Admitting: Nurse Practitioner

## 2022-09-20 NOTE — Progress Notes (Signed)
Patient ID: Bonnie [name], female   DOB: 12/30/1970, 51 y.o.   MRN: [id number]

## 2022-11-23 ENCOUNTER — Encounter: Payer: Self-pay | Admitting: Family Medicine

## 2022-12-05 NOTE — Telephone Encounter (Signed)
Front Please assist with emailing the letter to the address that Maudie Mercury provided  Feel free to forward this message also to Maudie Mercury  Dear Maudie Mercury You certainly have shown love and sacrifice to Ava.  I could not imagine a mother doing more.  It is a shame that our state makes it very difficult for caretakers.  I believe you are correct that writing a letter may not change things-but writing letters brings awareness to issues that state leaders hope go  unmentioned.  2 possibilities to write letters #1 [place] and record letter to the editor #2 [place] [employer] (not only is he our local representative he is actually the head of the  Senate-possibly partially responsible for lack of funding)  Please take care-we look forward to seeing Ava in the near future  Dr. Nicki Reaper

## 2022-12-18 ENCOUNTER — Encounter: Payer: Self-pay | Admitting: Family Medicine

## 2022-12-22 ENCOUNTER — Ambulatory Visit (INDEPENDENT_AMBULATORY_CARE_PROVIDER_SITE_OTHER): Payer: BC Managed Care – PPO

## 2022-12-22 ENCOUNTER — Encounter: Payer: Self-pay | Admitting: Podiatry

## 2022-12-22 ENCOUNTER — Ambulatory Visit (INDEPENDENT_AMBULATORY_CARE_PROVIDER_SITE_OTHER): Payer: BC Managed Care – PPO | Admitting: Podiatry

## 2022-12-22 DIAGNOSIS — M778 Other enthesopathies, not elsewhere classified: Secondary | ICD-10-CM

## 2022-12-22 DIAGNOSIS — M898X7 Other specified disorders of bone, ankle and foot: Secondary | ICD-10-CM

## 2022-12-22 NOTE — Progress Notes (Signed)
   Chief Complaint  Patient presents with   Foot Pain    "This left foot hurts where the mass of arthritis is hurts and there's a popping noise."    Subjective:  52 year old female PMHx prediabetes and prior history of foot surgery bilateral presenting for evaluation of left foot pain.  Patient states that the pain is very tolerable.  Aggravated by activity.  She does have a history of exostectomy to the left midfoot.  This is the area of residual pain.  She presents for further treatment and evaluation  Past Medical History:  Diagnosis Date   Depression    Dysthymia 1999   Hypothyroidism    PCOS (polycystic ovarian syndrome) 2000   Prediabetes    Past Surgical History:  Procedure Laterality Date   CARPAL TUNNEL RELEASE Right 10/17/2015   DR. Whitfield   CARPAL TUNNEL RELEASE Left    CESAREAN SECTION     COSMETIC SURGERY     DILATION AND CURETTAGE OF UTERUS     ENDOMETRIAL ABLATION     FACIAL COSMETIC SURGERY     oralmaxillofacial surgery, broke jaws    FOOT SURGERY     NECK SURGERY  May 2006   WISDOM TOOTH EXTRACTION     Allergies  Allergen Reactions   Augmentin [Amoxicillin-Pot Clavulanate]     Muscle soreness   Diclofenac Other (See Comments)    Got GERD   Levaquin [Levofloxacin In D5w]     Stomach cramps and diarrhea   Penicillins Other (See Comments)   Tramadol     Severe headache, no pain relief.      Objective: Physical Exam General: The patient is alert and oriented x3 in no acute distress.  Dermatology: Skin is cool, dry and supple bilateral lower extremities. Negative for open lesions or macerations.  Vascular: Palpable pedal pulses bilaterally. No edema or erythema noted. Capillary refill within normal limits.  Neurological: Grossly intact via light touch  Musculoskeletal Exam: There is some tenderness with palpation throughout the left midfoot consistent with DJD/arthritis.  Radiographic exam LT foot 12/22/2022: Normal osseous mineralization.   Prior history of surgery.  No acute fractures identified  Assessment: Capsulitis/DJD left foot -Patient evaluated.  X-rays reviewed -I did offer cortisone injection which the patient declined.  Discussed different treatment options for degenerative changes throughout the midfoot including arch supports. -OTC power step insoles dispensed at checkout. -Continue compression sleeves daily -RTC as needed  Edrick Kins, DPM [facility]  Dr. Edrick Kins, DPM    [address], [place] [address]                Office [phone]  Fax [phone]

## 2023-01-08 ENCOUNTER — Other Ambulatory Visit: Payer: Self-pay | Admitting: Family Medicine

## 2023-01-08 ENCOUNTER — Encounter: Payer: Self-pay | Admitting: Family Medicine

## 2023-01-08 MED ORDER — AMPHETAMINE-DEXTROAMPHETAMINE 20 MG PO TABS: ORAL_TABLET | ORAL | 0 refills | Status: DC

## 2023-01-08 NOTE — Telephone Encounter (Signed)
Hi Bonnie [name] refill was sent in Please go ahead and schedule follow-up visit.  Please be aware that follow-up visits are booking much quicker than they were in the past.  Therefore it is wise for you to go ahead and set up [name] follow-up visit within the next 30 days so that we can send in your next refill of Adderall at that time.  (Please be aware Adderall is [name] controlled medication by the state and requires regular follow-up visits without these visits we are not allowed to keep on prescribing Adderall)  Thanks-Dr. Sallee Lange

## 2023-02-05 ENCOUNTER — Other Ambulatory Visit: Payer: Self-pay | Admitting: Family Medicine

## 2023-02-05 MED ORDER — SYNTHROID 175 MCG PO TABS: ORAL_TABLET | ORAL | 5 refills | Status: DC

## 2023-02-07 ENCOUNTER — Encounter: Payer: Self-pay | Admitting: Podiatry

## 2023-02-09 ENCOUNTER — Ambulatory Visit (INDEPENDENT_AMBULATORY_CARE_PROVIDER_SITE_OTHER): Payer: BC Managed Care – PPO | Admitting: Family Medicine

## 2023-02-09 VITALS — BP 124/86 | HR 72 | Ht 66.0 in | Wt 199.2 lb

## 2023-02-09 DIAGNOSIS — E785 Hyperlipidemia, unspecified: Secondary | ICD-10-CM | POA: Diagnosis not present

## 2023-02-09 DIAGNOSIS — F988 Other specified behavioral and emotional disorders with onset usually occurring in childhood and adolescence: Secondary | ICD-10-CM | POA: Diagnosis not present

## 2023-02-09 DIAGNOSIS — Z79899 Other long term (current) drug therapy: Secondary | ICD-10-CM | POA: Diagnosis not present

## 2023-02-09 DIAGNOSIS — E039 Hypothyroidism, unspecified: Secondary | ICD-10-CM

## 2023-02-09 MED ORDER — SYNTHROID 175 MCG PO TABS: ORAL_TABLET | ORAL | 5 refills | Status: DC

## 2023-02-09 MED ORDER — AMPHETAMINE-DEXTROAMPHETAMINE 20 MG PO TABS: ORAL_TABLET | ORAL | 0 refills | Status: DC

## 2023-02-09 MED ORDER — BUPROPION HCL ER (SR) 200 MG PO TB12: ORAL_TABLET | ORAL | 5 refills | Status: DC

## 2023-02-09 NOTE — Progress Notes (Unsigned)
   Subjective:    Patient ID: Bonnie [name], female    DOB: 1971/01/18, 52 y.o.   MRN: [id number]  HPI Patient arrives today for 3 month follow up. Patient states no concerns or issues today. Patient has adult ADD Medicine does allow her to stay focused and on task She works part-time she also takes care of her disabled daughter She denies problems with the medicine  She does have bilateral foot pain she was on narcotics but she weaned herself off she is doing well off of the medicine. Attention deficit disorder (ADD) without hyperactivity  Hypothyroidism, unspecified type - Plan: TSH  Hyperlipidemia, unspecified hyperlipidemia type - Plan: Lipid panel  High risk medication use - Plan: Comprehensive metabolic panel    Review of Systems     Objective:   Physical Exam  General-in no acute distress Eyes-no discharge Lungs-respiratory rate normal, CTA CV-no murmurs,RRR Extremities skin warm dry no edema Neuro grossly normal Behavior normal, alert       Assessment & Plan:   1. Attention deficit disorder (ADD) without hyperactivity The patient was seen today as part of the visit regarding ADD.  Patient is stable on current regimen.  Appropriate prescriptions prescribed.  Medications were reviewed with the patient as well as compliance. Side effects were checked for. Discussion regarding effectiveness was held. Prescriptions were electronically sent in.  Patient reminded to follow-up in approximately 3 months.   Plans to [facility] law with drug registry was checked and verified while present with the patient.   Medicines were sent in.  Follow-up approximately 3 to 4 months we can send in 1 additional prescription if she notifies [place]  2. Hypothyroidism, unspecified type Continue current medication check lab work may need to make adjustments - TSH  3. Hyperlipidemia, unspecified hyperlipidemia type Healthy diet check lab work - Lipid panel  Stress and related  issues doing well on Wellbutrin  Patient also with significant difficulty sleeping at nighttime.  Recommend melatonin currently.  If that does not do enough we will entertain other possibilities do not recommend any sleep medicines with this otherwise

## 2023-02-09 NOTE — Patient Instructions (Signed)

## 2023-02-15 DIAGNOSIS — R234 Changes in skin texture: Secondary | ICD-10-CM | POA: Diagnosis not present

## 2023-02-17 DIAGNOSIS — N762 Acute vulvitis: Secondary | ICD-10-CM | POA: Diagnosis not present

## 2023-02-18 ENCOUNTER — Encounter: Payer: Self-pay | Admitting: Family Medicine

## 2023-02-18 NOTE — Telephone Encounter (Signed)
Nurses I would recommend low-dose trazodone 50 mg, one half or 1 whole each evening approximately 1 hour before sleep to try to help with sleep.  To be clear this is not a sleeping pill but instead is a low-dose antidepressant that can cause drowsiness.  It is used by many specialist to try to help induce sleep.  #30 with 2 refills It is reasonable to give this a try.  She can give [place] feedback on how it is going after 5 to 7 days.  Also I would recommend until her sleep pattern gets better to cut back on the Adderall to just 1 in the morning.  Adderall is a psychostimulant which can contribute to sleeping issues.  If any ongoing issues let us know.  Ambien and Lunesta would not be safe medications to try.

## 2023-03-02 ENCOUNTER — Encounter: Payer: Self-pay | Admitting: Podiatry

## 2023-03-02 ENCOUNTER — Ambulatory Visit (INDEPENDENT_AMBULATORY_CARE_PROVIDER_SITE_OTHER): Payer: BC Managed Care – PPO | Admitting: Podiatry

## 2023-03-02 VITALS — BP 123/83 | HR 80

## 2023-03-02 DIAGNOSIS — M778 Other enthesopathies, not elsewhere classified: Secondary | ICD-10-CM

## 2023-03-02 DIAGNOSIS — M7741 Metatarsalgia, right foot: Secondary | ICD-10-CM

## 2023-03-02 NOTE — Progress Notes (Signed)
Chief Complaint  Patient presents with   Foot Pain    "The top of my left foot bothers me when I work.  I have a place coming up on the second toe on the left foot.  I still have the numbness, so my balance is an issue too.  Does he think I could benefit from physical therapy where balance is concerned?  I need to know what type of insoles or shoes I need to support the ball of my feet.."    Subjective:  52 year old female PMHx prediabetes and prior history of foot surgery bilateral presenting for follow-up evaluation of left foot pain.  Patient states that she has been experiencing some balance issues.  She feels very unsteady in her gait and is wondering if physical therapy may help improve some of her instability issues. Patient states that she continues to have some pain and tenderness associated to the midfoot as well as the bilateral forefoot.  We had discussed custom molded orthotics in the past and she would like to discuss possibly pursuing this option  Past Medical History:  Diagnosis Date   Depression    Dysthymia 1999   Hypothyroidism    PCOS (polycystic ovarian syndrome) 2000   Prediabetes    Past Surgical History:  Procedure Laterality Date   CARPAL TUNNEL RELEASE Right 10/17/2015   DR. Whitfield   CARPAL TUNNEL RELEASE Left    CESAREAN SECTION     COSMETIC SURGERY     DILATION AND CURETTAGE OF UTERUS     ENDOMETRIAL ABLATION     FACIAL COSMETIC SURGERY     oralmaxillofacial surgery, broke jaws    FOOT SURGERY     NECK SURGERY  May 2006   WISDOM TOOTH EXTRACTION     Allergies  Allergen Reactions   Augmentin [Amoxicillin-Pot Clavulanate]     Muscle soreness   Diclofenac Other (See Comments)    Got GERD   Levaquin [Levofloxacin In D5w]     Stomach cramps and diarrhea   Penicillins Other (See Comments)   Tramadol     Severe headache, no pain relief.      Objective: Physical Exam General: The patient is alert and oriented x3 in no acute  distress.  Dermatology: Skin is cool, dry and supple bilateral lower extremities. Negative for open lesions or macerations.  Vascular: Palpable pedal pulses bilaterally. No edema or erythema noted. Capillary refill within normal limits.  Neurological: Grossly intact via light touch  Musculoskeletal Exam: There is some tenderness with palpation throughout the left midfoot consistent with DJD/arthritis.  Chronic metatarsalgia with tenderness throughout the forefoot noted bilateral  Radiographic exam LT foot 12/22/2022: Normal osseous mineralization.  Prior history of surgery.  No acute fractures identified  Assessment: 1.  Capsulitis/DJD left foot.  Metatarsalgia bilateral forefoot -Patient evaluated.   -I do believe the patient would benefit from custom molded orthotics to support the medial longitudinal arch of the foot and midtarsal joints to alleviate some of the capsulitis/DJD especially to the left foot.  Also metatarsal pads to offload the forefoot and alleviate her metatarsalgia type symptoms. -Order placed for custom molded orthotics.  Appointment with orthotics department to be scanned for custom molded orthotics -Silicone toe spacers were also dispensed to the patient to alleviate pressure from the great toe and the adjacent digit  2.  Gait instability bilateral lower extremities -Order placed for physical therapy at [facility].  Description provided for the patient.  Improved gait and strengthening exercises. -Return to  clinic as needed  [name], DPM [facility]  Dr. [name], DPM    [address] [place], [place] [address]                Office [phone]  Fax [phone]

## 2023-03-26 ENCOUNTER — Ambulatory Visit (INDEPENDENT_AMBULATORY_CARE_PROVIDER_SITE_OTHER): Payer: BC Managed Care – PPO

## 2023-03-26 DIAGNOSIS — M778 Other enthesopathies, not elsewhere classified: Secondary | ICD-10-CM | POA: Diagnosis not present

## 2023-03-26 DIAGNOSIS — M7741 Metatarsalgia, right foot: Secondary | ICD-10-CM

## 2023-03-26 NOTE — Progress Notes (Incomplete)
Patient presents today to be casted for custom molded orthotics. EVANS is the treating physician.  Impression foam cast was taken. ABN signed.  Patient info-  Shoe size: 8  Shoe style: ATHLETIC  Height: 5FT 5IN  Weight: 190  Insurance: BCBS   Patient will be notified once orthotics arrive in office and reappoint for fitting at that time.

## 2023-04-20 DIAGNOSIS — M25571 Pain in right ankle and joints of right foot: Secondary | ICD-10-CM | POA: Diagnosis not present

## 2023-04-20 DIAGNOSIS — M25572 Pain in left ankle and joints of left foot: Secondary | ICD-10-CM | POA: Diagnosis not present

## 2023-04-23 ENCOUNTER — Encounter: Payer: Self-pay | Admitting: Podiatry

## 2023-04-23 DIAGNOSIS — M25572 Pain in left ankle and joints of left foot: Secondary | ICD-10-CM | POA: Diagnosis not present

## 2023-04-23 DIAGNOSIS — M25571 Pain in right ankle and joints of right foot: Secondary | ICD-10-CM | POA: Diagnosis not present

## 2023-04-26 DIAGNOSIS — M25571 Pain in right ankle and joints of right foot: Secondary | ICD-10-CM | POA: Diagnosis not present

## 2023-04-26 DIAGNOSIS — M25572 Pain in left ankle and joints of left foot: Secondary | ICD-10-CM | POA: Diagnosis not present

## 2023-05-04 NOTE — Telephone Encounter (Signed)
Carney Bern, can you please follow up with this patient?

## 2023-05-06 ENCOUNTER — Encounter: Payer: Self-pay | Admitting: Family Medicine

## 2023-05-07 ENCOUNTER — Ambulatory Visit (INDEPENDENT_AMBULATORY_CARE_PROVIDER_SITE_OTHER): Payer: BC Managed Care – PPO | Admitting: Podiatry

## 2023-05-07 ENCOUNTER — Other Ambulatory Visit: Payer: Self-pay | Admitting: Family Medicine

## 2023-05-07 DIAGNOSIS — M25572 Pain in left ankle and joints of left foot: Secondary | ICD-10-CM | POA: Diagnosis not present

## 2023-05-07 DIAGNOSIS — M7741 Metatarsalgia, right foot: Secondary | ICD-10-CM

## 2023-05-07 DIAGNOSIS — M778 Other enthesopathies, not elsewhere classified: Secondary | ICD-10-CM

## 2023-05-07 DIAGNOSIS — M25571 Pain in right ankle and joints of right foot: Secondary | ICD-10-CM | POA: Diagnosis not present

## 2023-05-07 DIAGNOSIS — M2041 Other hammer toe(s) (acquired), right foot: Secondary | ICD-10-CM

## 2023-05-07 MED ORDER — AMPHETAMINE-DEXTROAMPHETAMINE 20 MG PO TABS: ORAL_TABLET | ORAL | 0 refills | Status: DC

## 2023-05-07 NOTE — Progress Notes (Unsigned)
Patient presents today to pick up custom insoles  Patient was dispensed 1 pair of custom orthotics. Fit was satisfactory. Instructions for break-in and wear was reviewed and a copy was given to the patient.

## 2023-05-10 ENCOUNTER — Encounter: Payer: Self-pay | Admitting: Podiatry

## 2023-05-31 DIAGNOSIS — E039 Hypothyroidism, unspecified: Secondary | ICD-10-CM | POA: Diagnosis not present

## 2023-05-31 DIAGNOSIS — Z79891 Long term (current) use of opiate analgesic: Secondary | ICD-10-CM | POA: Diagnosis not present

## 2023-05-31 DIAGNOSIS — E785 Hyperlipidemia, unspecified: Secondary | ICD-10-CM | POA: Diagnosis not present

## 2023-06-01 LAB — COMPREHENSIVE METABOLIC PANEL
ALT: 10 IU/L (ref 0–32)
AST: 13 IU/L (ref 0–40)
Albumin: 4.2 g/dL (ref 3.8–4.9)
Alkaline Phosphatase: 107 IU/L (ref 44–121)
BUN/Creatinine Ratio: 14 (ref 9–23)
BUN: 11 mg/dL (ref 6–24)
Bilirubin Total: 0.2 mg/dL (ref 0.0–1.2)
CO2: 18 mmol/L — ABNORMAL LOW (ref 20–29)
Calcium: 9.2 mg/dL (ref 8.7–10.2)
Chloride: 103 mmol/L (ref 96–106)
Creatinine, Ser: 0.79 mg/dL (ref 0.57–1.00)
Globulin, Total: 2.3 g/dL (ref 1.5–4.5)
Glucose: 100 mg/dL — ABNORMAL HIGH (ref 70–99)
Potassium: 4.7 mmol/L (ref 3.5–5.2)
Sodium: 138 mmol/L (ref 134–144)
Total Protein: 6.5 g/dL (ref 6.0–8.5)
eGFR: 90 mL/min/{1.73_m2} (ref >59)

## 2023-06-01 LAB — LIPID PANEL
Chol/HDL Ratio: 3.7 ratio (ref 0.0–4.4)
Cholesterol, Total: 257 mg/dL — ABNORMAL HIGH (ref 100–199)
HDL: 69 mg/dL (ref >39)
LDL Chol Calc (NIH): 141 mg/dL — ABNORMAL HIGH (ref 0–99)
Triglycerides: 263 mg/dL — ABNORMAL HIGH (ref 0–149)
VLDL Cholesterol Cal: 47 mg/dL — ABNORMAL HIGH (ref 5–40)

## 2023-06-01 LAB — TSH: TSH: <0.005 u[IU]/mL — ABNORMAL LOW (ref 0.450–4.500)

## 2023-06-03 ENCOUNTER — Other Ambulatory Visit: Payer: Self-pay

## 2023-06-08 ENCOUNTER — Ambulatory Visit (INDEPENDENT_AMBULATORY_CARE_PROVIDER_SITE_OTHER): Payer: BC Managed Care – PPO | Admitting: Family Medicine

## 2023-06-08 VITALS — BP 128/86 | HR 87 | Temp 97.5°F | Ht 66.0 in | Wt 203.0 lb

## 2023-06-08 DIAGNOSIS — E785 Hyperlipidemia, unspecified: Secondary | ICD-10-CM

## 2023-06-08 DIAGNOSIS — F988 Other specified behavioral and emotional disorders with onset usually occurring in childhood and adolescence: Secondary | ICD-10-CM | POA: Diagnosis not present

## 2023-06-08 DIAGNOSIS — G4709 Other insomnia: Secondary | ICD-10-CM | POA: Diagnosis not present

## 2023-06-08 DIAGNOSIS — E039 Hypothyroidism, unspecified: Secondary | ICD-10-CM | POA: Diagnosis not present

## 2023-06-08 MED ORDER — AMPHETAMINE-DEXTROAMPHETAMINE 20 MG PO TABS: ORAL_TABLET | ORAL | 0 refills | Status: DC

## 2023-06-08 NOTE — Progress Notes (Unsigned)
   Subjective:    Patient ID: Bonnie [name], female    DOB: [birth year], 52 y.o.   MRN: [id number]  HPI 3 month follow up - ADD refill medications This patient has adult ADD. Takes medication responsibly. Medication does help the patient focus in be more functional. Patient relates that they are or not abusing the medication or misusing the medication. The patient understands that if they're having any negative side effects such as elevated high blood pressure severe headaches they would need stop the medication follow-up immediately. They also understand that the prescriptions are to last for 3 months then the patient will need to follow-up before having further prescriptions.  Patient compliance good compliance  Does medication help patient function /attention better patient states it does allow her to be more attentive and focused  Side effects she denies side effects   not sleeping well - very interrupted sleep patterns- racing thoughts, sciatica-under a lot of stress just not sleeping well.  Having a hard time staying calm at night finds himself feeling anxious nervous  She does have intermittent sciatica issues on the left side  Frequent falls with hx of multiple foot surgeries- currently going to PT  She is seeing physical therapy as well as podiatry  Review of Systems     Objective:   Physical Exam  The 10-year ASCVD risk score (Arnett DK, et al., 2019) is: 1.6%   Values used to calculate the score:     Age: 87 years     Sex: Female     Is Non-Hispanic African American: No     Diabetic: No     Tobacco smoker: No     Systolic Blood Pressure: 128 mmHg     Is BP treated: No     HDL Cholesterol: 69 mg/dL     Total Cholesterol: 257 mg/dL  General-in no acute distress Eyes-no discharge Lungs-respiratory rate normal, CTA CV-no murmurs,RRR Extremities skin warm dry no edema Neuro grossly normal Behavior normal, alert Negative neurologic deficits     Assessment &  Plan:   1. Attention deficit disorder (ADD) without hyperactivity The patient was seen today as part of the visit regarding ADD.  Patient is stable on current regimen.  Appropriate prescriptions prescribed.  Medications were reviewed with the patient as well as compliance. Side effects were checked for. Discussion regarding effectiveness was held. Prescriptions were electronically sent in.  Patient reminded to follow-up in approximately 3 months.   Plans to [facility] law with drug registry was checked and verified while present with the patient. 3 prescription sent in  2. Hypothyroidism, unspecified type Patient recently adjusted her medications we will check labs before next visit  3. Hyperlipidemia, unspecified hyperlipidemia type Mild hyperlipidemia but risk for heart disease is low no need to start medicine  4. Other insomnia Behavioral techniques discussed

## 2023-06-29 ENCOUNTER — Ambulatory Visit (INDEPENDENT_AMBULATORY_CARE_PROVIDER_SITE_OTHER): Payer: BC Managed Care – PPO | Admitting: Podiatry

## 2023-06-29 ENCOUNTER — Encounter: Payer: Self-pay | Admitting: Podiatry

## 2023-06-29 ENCOUNTER — Ambulatory Visit (INDEPENDENT_AMBULATORY_CARE_PROVIDER_SITE_OTHER): Payer: BC Managed Care – PPO

## 2023-06-29 DIAGNOSIS — M2011 Hallux valgus (acquired), right foot: Secondary | ICD-10-CM | POA: Diagnosis not present

## 2023-06-29 DIAGNOSIS — Z9889 Other specified postprocedural states: Secondary | ICD-10-CM

## 2023-06-30 NOTE — Progress Notes (Signed)
Chief Complaint  Patient presents with   Post-op Problem    "I'm worried about my big toe.  Since I started Physical Therapy, my toe has moved onto the other foot.  On the left foot the massive Arthritis is coming back and it's painful like before."    Subjective:  52 year old female PMHx prediabetes and prior history of foot surgery bilateral presenting for follow-up evaluation of left foot pain.  Patient states that she has been experiencing some balance issues.  She feels very unsteady in her gait and is wondering if physical therapy may help improve some of her instability issues. Patient states that she continues to have some pain and tenderness associated to the midfoot as well as the bilateral forefoot.  We had discussed custom molded orthotics in the past and she would like to discuss possibly pursuing this option  Past Medical History:  Diagnosis Date   Depression    Dysthymia 1999   Hypothyroidism    PCOS (polycystic ovarian syndrome) 2000   Prediabetes    Past Surgical History:  Procedure Laterality Date   CARPAL TUNNEL RELEASE Right 10/17/2015   DR. Whitfield   CARPAL TUNNEL RELEASE Left    CESAREAN SECTION     COSMETIC SURGERY     DILATION AND CURETTAGE OF UTERUS     ENDOMETRIAL ABLATION     FACIAL COSMETIC SURGERY     oralmaxillofacial surgery, broke jaws    FOOT SURGERY     NECK SURGERY  May 2006   WISDOM TOOTH EXTRACTION     Allergies  Allergen Reactions   Augmentin [Amoxicillin-Pot Clavulanate]     Muscle soreness   Diclofenac Other (See Comments)    Got GERD   Levaquin [Levofloxacin In D5w]     Stomach cramps and diarrhea   Penicillins Other (See Comments)   Tramadol     Severe headache, no pain relief.    06/30/2023  Objective: Physical Exam General: The patient is alert and oriented x3 in no acute distress.  Dermatology: Skin is cool, dry and supple bilateral lower extremities. Negative for open lesions or macerations.  Vascular: Palpable  pedal pulses bilaterally. No edema or erythema noted. Capillary refill within normal limits.  Neurological: Grossly intact via light touch  Musculoskeletal Exam: Recurrent hallux valgus deformity noted with lateral deviation of the hallux and prominent medial eminence of the metatarsal head.  Exacerbated with weightbearing.  Please see above noted photo.  Crossover deformity of the great toe overlying the second digit.  Radiographic exam LT foot 12/22/2022: Normal osseous mineralization.  Prior history of surgery.  No acute fractures identified  Radiographic exam RT foot 06/29/2023: Recurrent increased intermetatarsal angle with an increased hallux abductus angle noted to the right foot.  History of prior bunion surgery with orthopedic screws to the first metatarsal of the right foot.  Assessment: 1.  Capsulitis/DJD left foot.  Metatarsalgia bilateral forefoot 2.  Gait instability bilateral lower extremities - Patient no longer going to [facility]. -Continue custom orthotics and good supportive tennis shoes  3.  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  History of bunionectomy and forefoot surgery right.  DOS: 09/25/2021 -Patient evaluated.  X-rays reviewed today -Unfortunate the patient continues to have pain and symptoms associated to her recurrent hallux valgus deformity of the right great toe.  Unfortunately there was movement postoperatively of the first metatarsal which created recurrent hallux valgus.  She states that she has pain on a daily basis as it presses  against the adjacent digit.  She is also having pain and tenderness associated with shoe gear.  She has tried custom orthotics and different shoes with minimal improvement.  She states that she would like to revise the bunion and correct for the bunion to better align the toe and alleviate pressure from the forefoot.  I do believe this is appropriate at this time to consider a revisional bunionectomy of the right  foot -Risk benefits advantages and disadvantages as well as the postoperative recovery course were explained in detail to the patient.  No guarantees were expressed or implied.  I do believe the patient would benefit from a Lapidus type bunionectomy.  This procedure was explained in detail as well as the postoperative recovery course.  I would like her to be strictly nonweightbearing to the surgical foot for minimum of 2 weeks postoperatively.  She understands.  All patient questions were answered.  No guarantees were expressed or implied -Authorization for surgery was initiated today.  Surgery will consist of removal of orthopedic screws first metatarsal right foot.  Lapidus type bunionectomy right foot.-After reviewing the x-rays as well after the patient had left she does have a significant amount of arthritis to the first MTP of the right foot.  May need to consider adding consent for possible first MTP arthrodesis. -Return to clinic 1 week postop  Bonnie [name], DPM [facility]  Dr. Felecia [name], DPM    [address] [place], [place] [address]                Office [phone]  Fax [phone]

## 2023-07-05 ENCOUNTER — Ambulatory Visit: Payer: BC Managed Care – PPO | Admitting: Podiatry

## 2023-07-29 ENCOUNTER — Telehealth: Payer: Self-pay | Admitting: Podiatry

## 2023-07-29 NOTE — Telephone Encounter (Signed)
DOS-08/26/23  LAPIDUS PROCEDURE INCLUDING BUNION [id number]  [employer] EFFECTIVE DATE- 11/16/2021  DEDUCTIBLE- $4000.00 WITH REMAINING $797.60 OOP- $4000.00 WITH REMAINING $975.79 COINSURANCE- 0%  SPOKE WITH JOY D FROM [employer], SHE STATED THAT FOR CPT CODE 33295 NO PRIOR AUTHORIZATION IS REQUIRED.  CALL REFERENCE #: [id number]

## 2023-08-10 ENCOUNTER — Encounter: Payer: Self-pay | Admitting: Family Medicine

## 2023-08-10 ENCOUNTER — Encounter: Payer: Self-pay | Admitting: Podiatry

## 2023-08-10 DIAGNOSIS — M25562 Pain in left knee: Secondary | ICD-10-CM

## 2023-08-11 NOTE — Telephone Encounter (Signed)
May have referral for left knee pain to [facility] please initiate

## 2023-08-19 ENCOUNTER — Encounter: Payer: Self-pay | Admitting: Family Medicine

## 2023-08-25 ENCOUNTER — Telehealth: Payer: Self-pay

## 2023-08-25 ENCOUNTER — Other Ambulatory Visit: Payer: Self-pay

## 2023-08-25 MED ORDER — SYNTHROID 175 MCG PO TABS: ORAL_TABLET | ORAL | 5 refills | Status: DC

## 2023-08-25 NOTE — Telephone Encounter (Signed)
Drug change request alternative-  euthyrox, levothyroxine sodium, levoxyl, unithyroid please send to [facility] on [address]

## 2023-08-26 ENCOUNTER — Other Ambulatory Visit: Payer: Self-pay

## 2023-08-26 NOTE — Telephone Encounter (Signed)
Levothyroxine 175 mcg Half tablet on Sundays 1 tablet on all other days 30 days supply, 5 refills  TSH free T4 T3 in approximately 8 to 10 weeks may have to adjust medication depending on this  Nurses please send the send May use generic Please notify patient of the importance of doing follow-up lab work Previously I got a message saying that she only can utilize the name brand and her insurance will not cover it and I sent a long message explaining how most insurance companies will not cover this anymore and less is generic and so we will have to try generic please move forward with this thank you

## 2023-08-26 NOTE — Telephone Encounter (Signed)
Message left and mychart message sent for follow up regarding thyroid medication

## 2023-08-30 ENCOUNTER — Other Ambulatory Visit: Payer: Self-pay | Admitting: Family Medicine

## 2023-08-30 MED ORDER — HYDROCODONE-ACETAMINOPHEN 10-325 MG PO TABS: ORAL_TABLET | ORAL | 0 refills | Status: DC

## 2023-08-30 MED ORDER — SYNTHROID 175 MCG PO TABS: ORAL_TABLET | ORAL | 5 refills | Status: DC

## 2023-09-03 ENCOUNTER — Encounter: Payer: BC Managed Care – PPO | Admitting: Podiatry

## 2023-09-07 ENCOUNTER — Ambulatory Visit (INDEPENDENT_AMBULATORY_CARE_PROVIDER_SITE_OTHER): Payer: BC Managed Care – PPO | Admitting: Podiatry

## 2023-09-07 ENCOUNTER — Ambulatory Visit: Payer: BC Managed Care – PPO | Admitting: Family Medicine

## 2023-09-07 VITALS — BP 115/72 | HR 86 | Wt 196.6 lb

## 2023-09-07 DIAGNOSIS — E785 Hyperlipidemia, unspecified: Secondary | ICD-10-CM | POA: Diagnosis not present

## 2023-09-07 DIAGNOSIS — F988 Other specified behavioral and emotional disorders with onset usually occurring in childhood and adolescence: Secondary | ICD-10-CM

## 2023-09-07 DIAGNOSIS — Z91199 Patient's noncompliance with other medical treatment and regimen due to unspecified reason: Secondary | ICD-10-CM

## 2023-09-07 DIAGNOSIS — Z79899 Other long term (current) drug therapy: Secondary | ICD-10-CM

## 2023-09-07 DIAGNOSIS — M25562 Pain in left knee: Secondary | ICD-10-CM | POA: Diagnosis not present

## 2023-09-07 DIAGNOSIS — E039 Hypothyroidism, unspecified: Secondary | ICD-10-CM

## 2023-09-07 MED ORDER — AMPHETAMINE-DEXTROAMPHETAMINE 20 MG PO TABS: ORAL_TABLET | ORAL | 0 refills | Status: DC

## 2023-09-07 NOTE — Progress Notes (Signed)
   Complete physical exam  Patient: Bonnie [name]   DOB: 09/05/1999   52 y.o. Female  MRN: [id number]  Subjective:    No chief complaint on file.   Bonnie [name] is a 52 y.o. female who presents today for a complete physical exam. She reports consuming a {diet types:17450} diet. {types:19826} She generally feels {DESC; WELL/FAIRLY WELL/POORLY:18703}. She reports sleeping {DESC; WELL/FAIRLY WELL/POORLY:18703}. She {does/does not:200015} have additional problems to discuss today.    Most recent fall risk assessment:    05/13/2022   10:42 AM  Fall Risk   Falls in the past year? 0  Number falls in past yr: 0  Injury with Fall? 0  Risk for fall due to : No Fall Risks  Follow up Falls evaluation completed     Most recent depression screenings:    05/13/2022   10:42 AM 04/03/2021   10:46 AM  PHQ 2/9 Scores  PHQ - 2 Score 0 0  PHQ- 9 Score 5     {VISON DENTAL STD PSA (Optional):27386}  {History (Optional):23778}  Patient Care Team: Jessup, Joy, NP as PCP - General (Nurse Practitioner)   Outpatient Medications Prior to Visit  Medication Sig   fluticasone (FLONASE) 50 MCG/ACT nasal spray Place 2 sprays into both nostrils in the morning and at bedtime. After 7 days, reduce to once daily.   norgestimate-ethinyl estradiol (SPRINTEC 28) 0.25-35 MG-MCG tablet Take 1 tablet by mouth daily.   Nystatin POWD Apply liberally to affected area 2 times per day   spironolactone (ALDACTONE) 100 MG tablet Take 1 tablet (100 mg total) by mouth daily.   No facility-administered medications prior to visit.    ROS        Objective:     There were no vitals taken for this visit. {Vitals History (Optional):23777}  Physical Exam   No results found for any visits on 06/18/22. {Show previous labs (optional):23779}    Assessment & Plan:    Routine Health Maintenance and Physical Exam  Immunization History  Administered Date(s) Administered   DTaP 11/19/1999, 01/15/2000,  03/25/2000, 12/09/2000, 06/24/2004   Hepatitis A 04/20/2008, 04/26/2009   Hepatitis B 09/06/1999, 10/14/1999, 03/25/2000   HiB (PRP-OMP) 11/19/1999, 01/15/2000, 03/25/2000, 12/09/2000   IPV 11/19/1999, 01/15/2000, 09/13/2000, 06/24/2004   Influenza,inj,Quad PF,6+ Mos 07/27/2014   Influenza-Unspecified 10/26/2012   MMR 09/13/2001, 06/24/2004   Meningococcal Polysaccharide 04/25/2012   Pneumococcal Conjugate-13 12/09/2000   Pneumococcal-Unspecified 03/25/2000, 06/08/2000   Tdap 04/25/2012   Varicella 09/13/2000, 04/20/2008    Health Maintenance  Topic Date Due   HIV Screening  Never done   Hepatitis C Screening  Never done   INFLUENZA VACCINE  06/16/2022   PAP-Cervical Cytology Screening  06/18/2022 (Originally 09/04/2020)   PAP SMEAR-Modifier  06/18/2022 (Originally 09/04/2020)   TETANUS/TDAP  06/18/2022 (Originally 04/25/2022)   HPV VACCINES  Discontinued   COVID-19 Vaccine  Discontinued    Discussed health benefits of physical activity, and encouraged her to engage in regular exercise appropriate for her age and condition.  Problem List Items Addressed This Visit   None Visit Diagnoses     Annual physical exam    -  Primary   Cervical cancer screening       Need for Tdap vaccination          No follow-ups on file.     Joy Jessup, NP   

## 2023-09-07 NOTE — Progress Notes (Signed)
   Subjective:    Patient ID: Bonnie [name], female    DOB: June 11, 1971, 52 y.o.   MRN: [id number]  HPI This patient has adult ADD. Takes medication responsibly. Medication does help the patient focus in be more functional. Patient relates that they are or not abusing the medication or misusing the medication. The patient understands that if they're having any negative side effects such as elevated high blood pressure severe headaches they would need stop the medication follow-up immediately. They also understand that the prescriptions are to last for 3 months then the patient will need to follow-up before having further prescriptions.  Patient compliance good compliance takes early in the morning takes a second 1 around 12 or 1  Does medication help patient function /attention better relates that the medicine does allow her to stay more focused and on task she denies  Side effects she denies side effects  The 10-year ASCVD risk score (Arnett DK, et al., 2019) is: 1.3%   Values used to calculate the score:     Age: 48 years     Sex: Female     Is Non-Hispanic African American: No     Diabetic: No     Tobacco smoker: No     Systolic Blood Pressure: 115 mmHg     Is BP treated: No     HDL Cholesterol: 69 mg/dL     Total Cholesterol: 257 mg/dL   Review of Systems     Objective:   Physical Exam General-in no acute distress Eyes-no discharge Lungs-respiratory rate normal, CTA CV-no murmurs,RRR Extremities skin warm dry no edema Neuro grossly normal Behavior normal, alert  I do not feel any clicks or pops with her knee. No swelling.  Subjective discomfort lateral portion     Assessment & Plan:   1. Left knee pain, unspecified chronicity We will go ahead with following up on her consultation with [facility]  2. Attention deficit disorder (ADD) without hyperactivity The patient was seen today as part of the visit regarding ADD.  Patient is stable on current regimen.   Appropriate prescriptions prescribed.  Medications were reviewed with the patient as well as compliance. Side effects were checked for. Discussion regarding effectiveness was held. Prescriptions were electronically sent in.  Patient reminded to follow-up in approximately 3 months.   Plans to [facility] law with drug registry was checked and verified while present with the patient. 3 prescription sent and she will let us know when she needs a fourth she will follow-up in approximately 3-1/2 to 4 months  3. Hypothyroidism, unspecified type Blood work before her follow-up visit

## 2023-09-10 ENCOUNTER — Encounter: Payer: BC Managed Care – PPO | Admitting: Podiatry

## 2023-09-15 ENCOUNTER — Encounter: Payer: Self-pay | Admitting: Family Medicine

## 2023-09-15 NOTE — Telephone Encounter (Signed)
Nurses-please do urgent consult to [facility] Dr.Cairns they should be more responsive thank you

## 2023-09-16 ENCOUNTER — Other Ambulatory Visit: Payer: Self-pay

## 2023-09-16 DIAGNOSIS — M25562 Pain in left knee: Secondary | ICD-10-CM

## 2023-09-21 ENCOUNTER — Encounter: Payer: Self-pay | Admitting: Orthopedic Surgery

## 2023-09-21 ENCOUNTER — Ambulatory Visit (INDEPENDENT_AMBULATORY_CARE_PROVIDER_SITE_OTHER): Payer: BC Managed Care – PPO | Admitting: Orthopedic Surgery

## 2023-09-21 ENCOUNTER — Other Ambulatory Visit (INDEPENDENT_AMBULATORY_CARE_PROVIDER_SITE_OTHER): Payer: Self-pay

## 2023-09-21 VITALS — Ht 66.0 in | Wt 197.0 lb

## 2023-09-21 DIAGNOSIS — G8929 Other chronic pain: Secondary | ICD-10-CM | POA: Diagnosis not present

## 2023-09-21 DIAGNOSIS — M25562 Pain in left knee: Secondary | ICD-10-CM

## 2023-09-21 NOTE — Patient Instructions (Signed)

## 2023-09-21 NOTE — Progress Notes (Signed)
New Patient Visit  Assessment: Bonnie [name] is a 52 y.o. female with the following: 1. Chronic pain of left knee  Plan: Bonnie [name] has chronic left knee pain.  Radiographs demonstrate some evidence of arthritis.  She has osteophytes, but well-maintained joint space on available x-rays.  On physical exam, she has diffuse tenderness along the medial and lateral joint lines.  She has good range of motion.  The knee is otherwise stable.  No Baker's cyst is appreciated.  She has tried medications, bracing, heat, ice bracing and some general activities, without sustained relief.  I have offered her steroid injection, and she elected to proceed.  I would consider formal physical therapy.  In addition, if she does not have sustained relief, we can seek an MRI.  Procedure note injection Left knee joint   Verbal consent was obtained to inject the left knee joint  Timeout was completed to confirm the site of injection.  The skin was prepped with alcohol and ethyl chloride was sprayed at the injection site.  A 21-gauge needle was used to inject 40 mg of Depo-Medrol and 1% lidocaine (4 cc) into the left knee using an anterolateral approach.  There were no complications. A sterile bandage was applied.     Follow-up: Return if symptoms worsen or fail to improve.  Subjective:  Chief Complaint  Patient presents with   Knee Pain    Bilat knee pain L > R for yrs. Told she has a cyst behind knee in '13-'14 and she's been fine with it using a fluid pill and OTC meds. Has had multiple episodes of knee giving way and she's fallen.     History of Present Illness: Bonnie [name] is a 52 y.o. female who presents for evaluation of bilateral knee pain.  Left is worse than right.  She has been referred by Lilyan Punt, MD.  She reports that she has pain in both knees, for several years.  Approximate 10 years ago, she was told that she had a cyst in the back of the knee.  At that time, it was  recommended that she take a "fluid pills", to manage the cyst.  This was largely effective.  More recently, she started to have pain.  It causes her to buckle.  She has fallen as a result.  She takes medications as needed, including Goody's powder.  She has tried a brace.  She has done exercises on her own.  She remains active.  She has not had an injection.  She has a 92 year old daughter, who has special needs.  She attends to her needs on a daily basis.   Review of Systems: No fevers or chills No numbness or tingling No chest pain No shortness of breath No bowel or bladder dysfunction No GI distress No headaches   Medical History:  Past Medical History:  Diagnosis Date   Depression    Dysthymia 1999   Hypothyroidism    PCOS (polycystic ovarian syndrome) 2000   Prediabetes     Past Surgical History:  Procedure Laterality Date   CARPAL TUNNEL RELEASE Right 10/17/2015   DR. Whitfield   CARPAL TUNNEL RELEASE Left    CESAREAN SECTION     COSMETIC SURGERY     DILATION AND CURETTAGE OF UTERUS     ENDOMETRIAL ABLATION     FACIAL COSMETIC SURGERY     oralmaxillofacial surgery, broke jaws    FOOT SURGERY     NECK SURGERY  May 2006  WISDOM TOOTH EXTRACTION      Family History  Problem Relation Age of Onset   Diabetes Other    Hypertension Father    Diabetes Father    Cancer Mother        adrenal, lung, pituitary, lymph nodes, ?primary   Diabetes Brother    Healthy Daughter    Colon cancer Neg Hx    Social History   Tobacco Use   Smoking status: Never   Smokeless tobacco: Never  Vaping Use   Vaping status: Never Used  Substance Use Topics   Alcohol use: Not Currently    Comment: RARELY   Drug use: No    Allergies  Allergen Reactions   Augmentin [Amoxicillin-Pot Clavulanate]     Muscle soreness   Diclofenac Other (See Comments)    Got GERD   Levaquin [Levofloxacin In D5w]     Stomach cramps and diarrhea   Penicillins Other (See Comments)   Tramadol      Severe headache, no pain relief.    Current Meds  Medication Sig   amphetamine-dextroamphetamine (ADDERALL) 20 MG tablet Take one qam and one q noon   amphetamine-dextroamphetamine (ADDERALL) 20 MG tablet 1 qam and 1 q noon   amphetamine-dextroamphetamine (ADDERALL) 20 MG tablet 1 qam and 1 q noon   buPROPion (WELLBUTRIN SR) 200 MG 12 hr tablet TAKE 1 TABLET BY MOUTH TWICE DAILY   estradiol (VIVELLE-DOT) 0.1 MG/24HR patch APPLY 1 PATCH TOPICALLY TO THE SKIN 2 TIMES A WEEK   fexofenadine (ALLEGRA) 180 MG tablet Take 180 mg by mouth daily.   Fexofenadine HCl (ALLEGRA PO) Take by mouth daily.   HYDROcodone-acetaminophen (NORCO) 10-325 MG tablet 1 every 6 hours as needed pain use sparingly caution drowsiness   progesterone (PROMETRIUM) 100 MG capsule TAKE 1 CAPSULE BY MOUTH EVERY DAY AT BEDTIME   SYNTHROID 175 MCG tablet TAKE 1/2  TABLET BY MOUTH ON SUNDAYS AND 1 WHOLE TABLET ON ALL THE OTHER DAYS    Objective: Ht 5\' 6"  (1.676 m)   Wt 197 lb (89.4 kg)   LMP 05/10/2013   BMI 31.80 kg/m   Physical Exam:  General: Alert and oriented. and No acute distress. Gait: Left sided antalgic gait.  Evaluation of the left knee demonstrates no swelling.  No bruising.  No redness is appreciated.  Positive crepitus with range of motion, especially behind the patella.  Negative Lachman.  No increased laxity varus or valgus stress.  Tenderness to palpation along the medial and lateral joint line.  No Baker's cyst is appreciated.  Incision is intact distally.  IMAGING: I personally ordered and reviewed the following images  X-rays of the left knee were obtained in clinic today.  No acute injuries are noted.  Neutral overall alignment.  There are osteophytes throughout the knee.  Well-maintained joint space otherwise.  Large osteophyte in the lateral plateau.  Large osteophyte off to the lateral trochlea as visualized on the sunrise view.  No bony lesions.  Impression: Left knee x-ray without acute  injuries, evidence of mild to moderate degenerative changes   New Medications:  No orders of the defined types were placed in this encounter.     Oliver Barre, MD  09/21/2023 5:00 PM

## 2023-09-24 ENCOUNTER — Encounter: Payer: BC Managed Care – PPO | Admitting: Podiatry

## 2023-09-29 ENCOUNTER — Encounter: Payer: Self-pay | Admitting: Orthopedic Surgery

## 2023-09-30 MED ORDER — HYDROCODONE-ACETAMINOPHEN 5-325 MG PO TABS: 1.0000 | ORAL_TABLET | Freq: Four times a day (QID) | ORAL | 0 refills | Status: DC | PRN

## 2023-10-04 ENCOUNTER — Telehealth: Payer: Self-pay | Admitting: Family Medicine

## 2023-10-04 ENCOUNTER — Other Ambulatory Visit: Payer: Self-pay

## 2023-10-04 MED ORDER — SYNTHROID 175 MCG PO TABS: ORAL_TABLET | ORAL | 5 refills | Status: DC

## 2023-10-04 NOTE — Telephone Encounter (Signed)
Refill on  SYNTHROID 175 MCG tablet  [facility] [address]

## 2023-10-04 NOTE — Telephone Encounter (Signed)
RX for synthroid has been refilled

## 2023-10-13 ENCOUNTER — Encounter: Payer: Self-pay | Admitting: Family Medicine

## 2023-10-13 NOTE — Telephone Encounter (Signed)
Nurses I read over the message I sent in a prescription for Bonnie [name] at [facility] on [address] for ofloxacin eardrops 5 drops in the ear for up to 5 days  Please assist setting her up for a office visit next week but our office is closed Friday if she has significant troubles over the weekend she may have to go to urgent care

## 2023-12-03 DIAGNOSIS — Z124 Encounter for screening for malignant neoplasm of cervix: Secondary | ICD-10-CM | POA: Diagnosis not present

## 2023-12-03 DIAGNOSIS — Z1231 Encounter for screening mammogram for malignant neoplasm of breast: Secondary | ICD-10-CM | POA: Diagnosis not present

## 2023-12-03 DIAGNOSIS — Z01419 Encounter for gynecological examination (general) (routine) without abnormal findings: Secondary | ICD-10-CM | POA: Diagnosis not present

## 2023-12-03 DIAGNOSIS — Z6832 Body mass index (BMI) 32.0-32.9, adult: Secondary | ICD-10-CM | POA: Diagnosis not present

## 2023-12-07 ENCOUNTER — Encounter: Payer: Self-pay | Admitting: Orthopedic Surgery

## 2023-12-07 ENCOUNTER — Encounter: Payer: Self-pay | Admitting: Family Medicine

## 2023-12-08 NOTE — Telephone Encounter (Signed)
Nurses ADD medicine can cause increase of blood pressure  If blood pressure is elevated it is recommended not to be taking ADD medicine that is a stimulant  We are trying to be safe I would recommend a same-day visit with myself during one of my same-day open slots toward the end of the day so I can see her, talk with her, check blood pressure  Once we reevaluate the situation then we can act on what is best regarding the medication  We are doing the best we can to be fair and safe. I certainly tried to be sympathetic and understanding to the patient's message. It is important for her to understand that we are trying to do the best we can for each patient for their safety and wellbeing.  We are all trying to do what is best for the patient every day. I hope she has some understanding of this  Please set up the appointment-Dr. Lorin Picket

## 2023-12-10 ENCOUNTER — Other Ambulatory Visit: Payer: Self-pay | Admitting: Podiatry

## 2023-12-10 ENCOUNTER — Encounter: Payer: Self-pay | Admitting: Podiatry

## 2023-12-10 MED ORDER — SILVER SULFADIAZINE 1 % EX CREA: 1.0000 | TOPICAL_CREAM | Freq: Every day | CUTANEOUS | 1 refills | Status: AC

## 2023-12-14 ENCOUNTER — Ambulatory Visit: Payer: BC Managed Care – PPO | Admitting: Orthopedic Surgery

## 2023-12-14 ENCOUNTER — Other Ambulatory Visit (INDEPENDENT_AMBULATORY_CARE_PROVIDER_SITE_OTHER): Payer: Self-pay

## 2023-12-14 VITALS — BP 127/87 | HR 83 | Ht 66.0 in | Wt 192.0 lb

## 2023-12-14 DIAGNOSIS — G8929 Other chronic pain: Secondary | ICD-10-CM | POA: Diagnosis not present

## 2023-12-14 DIAGNOSIS — M79641 Pain in right hand: Secondary | ICD-10-CM

## 2023-12-14 DIAGNOSIS — M25562 Pain in left knee: Secondary | ICD-10-CM | POA: Diagnosis not present

## 2023-12-14 DIAGNOSIS — M65331 Trigger finger, right middle finger: Secondary | ICD-10-CM

## 2023-12-14 NOTE — Patient Instructions (Signed)

## 2023-12-15 ENCOUNTER — Encounter: Payer: Self-pay | Admitting: Orthopedic Surgery

## 2023-12-15 NOTE — Progress Notes (Signed)
Orthopaedic Clinic Return  Assessment: Bonnie [name] is a 53 y.o. female with the following: Anterior left knee pain Right long finger trigger finger.   Plan: Long finger pain and catching consistent with trigger finger.  We discussed an injection but her symptoms are improving.  Also reviewed a brace that she shoulder consider.  Regarding her knee, I think it would response to PT. She is reluctant to commit the time and money.  Home exercises provided.  She will return as needed.   Follow-up: Return if symptoms worsen or fail to improve.   Subjective:  Chief Complaint  Patient presents with   Hand Pain    Right hand pain, right middle finger.     History of Present Illness: Bonnie [name] is a 53 y.o. female who returns to clinic for evaluation of right long finger pain.  Started 1-2 weeks ago.  Has some pain that is getting better.  She notes some catching.  She continues to have left knee pain.  Injection with limited improvement.    Review of Systems: No fevers or chills No numbness or tingling No chest pain No shortness of breath No bowel or bladder dysfunction No GI distress No headaches  Objective: BP 127/87   Pulse 83   Ht 5\' 6"  (1.676 m)   Wt 192 lb (87.1 kg)   LMP 05/10/2013   BMI 30.99 kg/m   Physical Exam:  Right hand without deformity.  No swelling.  Tender over the A1 pulley to the long finger.  No active triggering.   Left knee without swelling.  Tender over the anterior knee.  No bruising.  God ROM. +crepitus.   IMAGING: I personally ordered and reviewed the following images:  XR of the right hand was obtained in clinic today.  No acute injuries.  No dislocation.  Well maintained joint spaces.  No soft tissue swelling.  No bony lesions.   Impression: negative right hand XR  Oliver Barre, MD 12/15/2023 5:28 PM

## 2023-12-17 ENCOUNTER — Ambulatory Visit (INDEPENDENT_AMBULATORY_CARE_PROVIDER_SITE_OTHER): Payer: BC Managed Care – PPO | Admitting: Podiatry

## 2023-12-17 ENCOUNTER — Encounter: Payer: Self-pay | Admitting: Podiatry

## 2023-12-17 VITALS — Ht 66.0 in | Wt 192.0 lb

## 2023-12-17 DIAGNOSIS — Z91199 Patient's noncompliance with other medical treatment and regimen due to unspecified reason: Secondary | ICD-10-CM

## 2023-12-17 NOTE — Progress Notes (Unsigned)
No chief complaint on file.   Subjective:  53 year old female PMHx prediabetes and prior history of foot surgery bilateral presenting for follow-up evaluation of left foot pain.  Patient states that she has been experiencing some balance issues.  She feels very unsteady in her gait and is wondering if physical therapy may help improve some of her instability issues. Patient states that she continues to have some pain and tenderness associated to the midfoot as well as the bilateral forefoot.  We had discussed custom molded orthotics in the past and she would like to discuss possibly pursuing this option  Past Medical History:  Diagnosis Date   Depression    Dysthymia 1999   Hypothyroidism    PCOS (polycystic ovarian syndrome) 2000   Prediabetes    Past Surgical History:  Procedure Laterality Date   CARPAL TUNNEL RELEASE Right 10/17/2015   DR. Whitfield   CARPAL TUNNEL RELEASE Left    CESAREAN SECTION     COSMETIC SURGERY     DILATION AND CURETTAGE OF UTERUS     ENDOMETRIAL ABLATION     FACIAL COSMETIC SURGERY     oralmaxillofacial surgery, broke jaws    FOOT SURGERY     NECK SURGERY  May 2006   WISDOM TOOTH EXTRACTION     Allergies  Allergen Reactions   Augmentin [Amoxicillin-Pot Clavulanate]     Muscle soreness   Diclofenac Other (See Comments)    Got GERD   Levaquin [Levofloxacin In D5w]     Stomach cramps and diarrhea   Penicillins Other (See Comments)   Tramadol     Severe headache, no pain relief.    06/30/2023  Objective: Physical Exam General: The patient is alert and oriented x3 in no acute distress.  Dermatology: Skin is cool, dry and supple bilateral lower extremities. Negative for open lesions or macerations.  Vascular: Palpable pedal pulses bilaterally. No edema or erythema noted. Capillary refill within normal limits.  Neurological: Grossly intact via light touch  Musculoskeletal Exam: Recurrent hallux valgus deformity noted with lateral  deviation of the hallux and prominent medial eminence of the metatarsal head.  Exacerbated with weightbearing.  Please see above noted photo.  Crossover deformity of the great toe overlying the second digit.  Radiographic exam LT foot 12/22/2022: Normal osseous mineralization.  Prior history of surgery.  No acute fractures identified  Radiographic exam RT foot 06/29/2023: Recurrent increased intermetatarsal angle with an increased hallux abductus angle noted to the right foot.  History of prior bunion surgery with orthopedic screws to the first metatarsal of the right foot.  Assessment: 1.  Capsulitis/DJD left foot.  Metatarsalgia bilateral forefoot 2.  Gait instability bilateral lower extremities - Patient no longer going to [name]. -Continue custom orthotics and good supportive tennis shoes  3.  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  History of bunionectomy and forefoot surgery right.  DOS: 09/25/2021 -Patient evaluated.  X-rays reviewed today -Unfortunate the patient continues to have pain and symptoms associated to her recurrent hallux valgus deformity of the right great toe.  Unfortunately there was movement postoperatively of the first metatarsal which created recurrent hallux valgus.  She states that she has pain on a daily basis as it presses against the adjacent digit.  She is also having pain and tenderness associated with shoe gear.  She has tried custom orthotics and different shoes with minimal improvement.  She states that she would like to revise the bunion and correct for the bunion to  better align the toe and alleviate pressure from the forefoot.  I do believe this is appropriate at this time to consider a revisional bunionectomy of the right foot -Risk benefits advantages and disadvantages as well as the postoperative recovery course were explained in detail to the patient.  No guarantees were expressed or implied.  I do believe the patient would benefit from a  Lapidus type bunionectomy.  This procedure was explained in detail as well as the postoperative recovery course.  I would like her to be strictly nonweightbearing to the surgical foot for minimum of 2 weeks postoperatively.  She understands.  All patient questions were answered.  No guarantees were expressed or implied -Authorization for surgery was initiated today.  Surgery will consist of removal of orthopedic screws first metatarsal right foot.  Lapidus type bunionectomy right foot.-After reviewing the x-rays as well after the patient had left she does have a significant amount of arthritis to the first MTP of the right foot.  May need to consider adding consent for possible first MTP arthrodesis. -Return to clinic 1 week postop  [name], DPM [facility]  Dr. [name], DPM    [address] [place], [place] [address]                Office [phone]  Fax [phone]

## 2023-12-29 ENCOUNTER — Ambulatory Visit: Payer: BC Managed Care – PPO | Admitting: Family Medicine

## 2024-02-29 ENCOUNTER — Ambulatory Visit: Admitting: Family Medicine

## 2024-02-29 ENCOUNTER — Ambulatory Visit (INDEPENDENT_AMBULATORY_CARE_PROVIDER_SITE_OTHER): Admitting: Family Medicine

## 2024-02-29 ENCOUNTER — Encounter: Payer: Self-pay | Admitting: Family Medicine

## 2024-02-29 VITALS — BP 124/82 | HR 94 | Temp 97.7°F | Ht 66.0 in | Wt 180.2 lb

## 2024-02-29 DIAGNOSIS — F419 Anxiety disorder, unspecified: Secondary | ICD-10-CM | POA: Diagnosis not present

## 2024-02-29 DIAGNOSIS — E039 Hypothyroidism, unspecified: Secondary | ICD-10-CM | POA: Diagnosis not present

## 2024-02-29 DIAGNOSIS — G4709 Other insomnia: Secondary | ICD-10-CM

## 2024-02-29 DIAGNOSIS — F988 Other specified behavioral and emotional disorders with onset usually occurring in childhood and adolescence: Secondary | ICD-10-CM

## 2024-02-29 MED ORDER — AMPHETAMINE-DEXTROAMPHETAMINE 20 MG PO TABS: ORAL_TABLET | ORAL | 0 refills | Status: DC

## 2024-02-29 MED ORDER — ESCITALOPRAM OXALATE 10 MG PO TABS: 10.0000 mg | ORAL_TABLET | Freq: Every day | ORAL | 5 refills | Status: DC

## 2024-02-29 MED ORDER — HYDROXYZINE PAMOATE 25 MG PO CAPS: 25.0000 mg | ORAL_CAPSULE | Freq: Every evening | ORAL | 0 refills | Status: AC | PRN

## 2024-02-29 NOTE — Progress Notes (Signed)
 Subjective:    Patient ID: Bonnie [name], female    DOB: [birth year], 53 y.o.   MRN: [id number]  HPI  Patient is a very pleasant 53 year old Caucasian female who presents today to establish care.  She states that she has a long history of depression.  She has been on sustained-release Wellbutrin 200 mg twice a day for more than years.  This has worked well for her until recently.  She states that she is having panic attacks.  She feels anxious on a daily basis.  She is having trouble sleeping at night.  She also feels sad at times.  Most of this stems from stress caring for her husband who is a noncompliant diabetic.  She states that she has had to contact 911 due to a blood sugar of 14.  She found her husband lying in the floor unresponsive and foaming at the mouth.  This traumatized her.  However he will not check his blood sugars or take care of himself.  This only exacerbates her anxiety.  She feels helpless.  She denies any suicidal thoughts.  However she states that her anxiety recently became so bad that she got dehydrated because she had stopped eating.  She also has a history of hypothyroidism as well as ADD.  She has been on Adderall 20 mg twice daily for years.  She also takes namebrand Synthroid 175 mcg daily.  She states that she cannot tolerate generic levothyroxine. Past Medical History:  Diagnosis Date   Depression    Dysthymia 1999   Hypothyroidism    PCOS (polycystic ovarian syndrome) 2000   Prediabetes    Past Surgical History:  Procedure Laterality Date   CARPAL TUNNEL RELEASE Right 10/17/2015   DR. Whitfield   CARPAL TUNNEL RELEASE Left    CESAREAN SECTION     COSMETIC SURGERY     DILATION AND CURETTAGE OF UTERUS     ENDOMETRIAL ABLATION     FACIAL COSMETIC SURGERY     oralmaxillofacial surgery, broke jaws    FOOT SURGERY     NECK SURGERY  May 2006   WISDOM TOOTH EXTRACTION     Current Outpatient Medications on File Prior to Visit  Medication Sig Dispense  Refill   amphetamine-dextroamphetamine (ADDERALL) 20 MG tablet Take one qam and one q noon 60 tablet 0   buPROPion (WELLBUTRIN SR) 200 MG 12 hr tablet TAKE 1 TABLET BY MOUTH TWICE DAILY 60 tablet 5   estradiol (VIVELLE-DOT) 0.1 MG/24HR patch APPLY 1 PATCH TOPICALLY TO THE SKIN 2 TIMES A WEEK     fexofenadine (ALLEGRA) 180 MG tablet Take 180 mg by mouth daily.     progesterone (PROMETRIUM) 100 MG capsule TAKE 1 CAPSULE BY MOUTH EVERY DAY AT BEDTIME     SYNTHROID 175 MCG tablet TAKE 1/2  TABLET BY MOUTH ON SUNDAYS AND 1 WHOLE TABLET ON ALL THE OTHER DAYS (Patient taking differently: TAKE 1/2  TABLET BY MOUTH ON SUNDAYS AND 1 WHOLE TABLET ON ALL THE OTHER DAYS. PT NEEDS NAME BRAND.) 30 tablet 5   silver sulfADIAZINE (SILVADENE) 1 % cream Apply 1 Application topically daily. (Patient not taking: Reported on 02/29/2024) 25 g 1   No current facility-administered medications on file prior to visit.   Allergies  Allergen Reactions   Augmentin [Amoxicillin-Pot Clavulanate]     Muscle soreness   Diclofenac Other (See Comments)    Got GERD   Levaquin [Levofloxacin In D5w]     Stomach cramps and diarrhea  Penicillins Other (See Comments)   Tramadol     Severe headache, no pain relief.   Social History   Socioeconomic History   Marital status: Married    Spouse name: Not on file   Number of children: 1   Years of education: Not on file   Highest education level: Not on file  Occupational History   Occupation: stay at home  Tobacco Use   Smoking status: Never   Smokeless tobacco: Never  Vaping Use   Vaping status: Never Used  Substance and Sexual Activity   Alcohol use: Not Currently    Comment: RARELY   Drug use: No   Sexual activity: Not on file  Other Topics Concern   Not on file  Social History Narrative   Not on file   Social Drivers of Health   Financial Resource Strain: Not on file  Food Insecurity: Not on file  Transportation Needs: Not on file  Physical Activity: Not  on file  Stress: Not on file  Social Connections: Not on file  Intimate Partner Violence: Not on file     Review of Systems  All other systems reviewed and are negative.      Objective:   Physical Exam Constitutional:      Appearance: Normal appearance. She is normal weight.  Cardiovascular:     Rate and Rhythm: Normal rate and regular rhythm.     Heart sounds: Normal heart sounds.  Pulmonary:     Effort: No respiratory distress.     Breath sounds: Normal breath sounds.  Neurological:     General: No focal deficit present.     Mental Status: She is alert and oriented to person, place, and time.     Cranial Nerves: No cranial nerve deficit.     Coordination: Coordination normal.     Gait: Gait normal.  Psychiatric:        Mood and Affect: Mood normal.        Behavior: Behavior normal.        Thought Content: Thought content normal.   More than 30 minutes of our encounter was spent in discussion.  Therefore, we had a very limited physical exam        Assessment & Plan:  Hypothyroidism, unspecified type  Attention deficit disorder (ADD) without hyperactivity  Other insomnia  Anxiety I have recommended supplementing the Wellbutrin with Lexapro 10 mg a day to try to help reduce her anxiety and control it better.  I will not make any adjustments to her Adderall or Synthroid at the present time.  Recommended that we recheck in 6 weeks to see how she is doing.  At that time I would like to obtain fasting lab work.

## 2024-03-03 ENCOUNTER — Other Ambulatory Visit: Payer: Self-pay | Admitting: Family Medicine

## 2024-03-03 ENCOUNTER — Encounter: Payer: Self-pay | Admitting: Family Medicine

## 2024-03-03 MED ORDER — ALPRAZOLAM 0.5 MG PO TABS: 0.5000 mg | ORAL_TABLET | Freq: Every evening | ORAL | 0 refills | Status: AC | PRN

## 2024-03-17 ENCOUNTER — Encounter: Payer: Self-pay | Admitting: Podiatry

## 2024-03-20 ENCOUNTER — Ambulatory Visit: Payer: Self-pay

## 2024-03-20 NOTE — Telephone Encounter (Signed)
 Chief Complaint: Bilateral ankle/calf swelling Symptoms: see above Frequency: 2 days Pertinent Negatives: Patient denies chest pain, difficulty breathing, warmth/redness in calves Disposition: [] ED /[] Urgent Care (no appt availability in office) / [x] Appointment(In office/virtual)/ []  [facility]/ [] Home Care/ [] Refused Recommended Disposition /[] [facility]/ []  Follow-up with PCP Additional Notes: Patient called in stating she is experiencing bilateral swelling in her ankles and calves. Patient states it appears worse than she's had in the past. Patient uncertain of cause. Patient denies chest pain, difficulty breathing, warmth/redness. Patient appt made for further evaluation. Patient has been elevating and icing ankles in the meantime.    Reason for Disposition  MILD or MODERATE ankle swelling (e.g., can't move joint normally, can't do usual activities) (Exceptions: Itchy, localized swelling; swelling is chronic.)  Answer Assessment - Initial Assessment Questions 1. LOCATION: "Which ankle is swollen?" "Where is the swelling?"     Ankle into shins and calves - both legs 2. ONSET: "When did the swelling start?"     2 days ago 3. SWELLING: "How bad is the swelling?" Or, "How large is it?" (e.g., mild, moderate, severe; size of localized swelling)    - NONE: No joint swelling.   - LOCALIZED: Localized; small area of puffy or swollen skin (e.g., insect bite, skin irritation).   - MILD: Joint looks or feels mildly swollen or puffy.   - MODERATE: Swollen; interferes with normal activities (e.g., work or school); decreased range of movement; may be limping.   - SEVERE: Very swollen; can't move swollen joint at all; limping a lot or unable to walk.     Moderate  4. PAIN: "Is there any pain?" If Yes, ask: "How bad is it?" (Scale 1-10; or mild, moderate, severe)   - NONE (0): no pain.   - MILD (1-3): doesn't interfere with normal activities.    - MODERATE (4-7): interferes  with normal activities (e.g., work or school) or awakens from sleep, limping.    - SEVERE (8-10): excruciating pain, unable to do any normal activities, unable to walk.      Patient states mild pain 5. CAUSE: "What do you think caused the ankle swelling?"     Unsure - patient had surgery on both feet in 2022  6. OTHER SYMPTOMS: "Do you have any other symptoms?" (e.g., fever, chest pain, difficulty breathing, calf pain)     No  Protocols used: Ankle Swelling-A-AH

## 2024-03-21 ENCOUNTER — Ambulatory Visit (INDEPENDENT_AMBULATORY_CARE_PROVIDER_SITE_OTHER): Admitting: Family Medicine

## 2024-03-21 VITALS — BP 124/78 | HR 83 | Temp 97.8°F | Ht 66.0 in | Wt 182.0 lb

## 2024-03-21 DIAGNOSIS — M7989 Other specified soft tissue disorders: Secondary | ICD-10-CM | POA: Diagnosis not present

## 2024-03-21 LAB — CBC WITH DIFFERENTIAL/PLATELET
Absolute Lymphocytes: 1786 {cells}/uL (ref 850–3900)
Absolute Monocytes: 701 {cells}/uL (ref 200–950)
Basophils Absolute: 31 {cells}/uL (ref 0–200)
Basophils Relative: 0.4 %
Eosinophils Absolute: 300 {cells}/uL (ref 15–500)
Eosinophils Relative: 3.9 %
HCT: 38.3 % (ref 35.0–45.0)
Hemoglobin: 13 g/dL (ref 11.7–15.5)
MCH: 31.4 pg (ref 27.0–33.0)
MCHC: 33.9 g/dL (ref 32.0–36.0)
MCV: 92.5 fL (ref 80.0–100.0)
MPV: 11.3 fL (ref 7.5–12.5)
Monocytes Relative: 9.1 %
Neutro Abs: 4882 {cells}/uL (ref 1500–7800)
Neutrophils Relative %: 63.4 %
Platelets: 283 10*3/uL (ref 140–400)
RBC: 4.14 10*6/uL (ref 3.80–5.10)
RDW: 11.4 % (ref 11.0–15.0)
Total Lymphocyte: 23.2 %
WBC: 7.7 10*3/uL (ref 3.8–10.8)

## 2024-03-21 LAB — COMPLETE METABOLIC PANEL WITHOUT GFR
AG Ratio: 2.1 (calc) (ref 1.0–2.5)
ALT: 14 U/L (ref 6–29)
AST: 19 U/L (ref 10–35)
Albumin: 4.1 g/dL (ref 3.6–5.1)
Alkaline phosphatase (APISO): 78 U/L (ref 37–153)
BUN: 9 mg/dL (ref 7–25)
CO2: 33 mmol/L — ABNORMAL HIGH (ref 20–32)
Calcium: 9.4 mg/dL (ref 8.6–10.4)
Chloride: 104 mmol/L (ref 98–110)
Creat: 0.83 mg/dL (ref 0.50–1.03)
Globulin: 2 g/dL (ref 1.9–3.7)
Glucose, Bld: 72 mg/dL (ref 65–99)
Potassium: 4.6 mmol/L (ref 3.5–5.3)
Sodium: 140 mmol/L (ref 135–146)
Total Bilirubin: 0.5 mg/dL (ref 0.2–1.2)
Total Protein: 6.1 g/dL (ref 6.1–8.1)

## 2024-03-21 MED ORDER — FUROSEMIDE 20 MG PO TABS: 20.0000 mg | ORAL_TABLET | Freq: Every day | ORAL | 3 refills | Status: AC

## 2024-03-21 NOTE — Progress Notes (Signed)
 Subjective:    Patient ID: Bonnie [name], female    DOB: 28-Jul-1971, 53 y.o.   MRN: [id number]  HPI 02/29/24 Patient is a very pleasant 52 year old Caucasian female who presents today to establish care.  She states that she has a long history of depression.  She has been on sustained-release Wellbutrin  200 mg twice a day for more than years.  This has worked well for her until recently.  She states that she is having panic attacks.  She feels anxious on a daily basis.  She is having trouble sleeping at night.  She also feels sad at times.  Most of this stems from stress caring for her husband who is a noncompliant diabetic.  She states that she has had to contact 911 due to a blood sugar of 14.  She found her husband lying in the floor unresponsive and foaming at the mouth.  This traumatized her.  However he will not check his blood sugars or take care of himself.  This only exacerbates her anxiety.  She feels helpless.  She denies any suicidal thoughts.  However she states that her anxiety recently became so bad that she got dehydrated because she had stopped eating.  She also has a history of hypothyroidism as well as ADD.  She has been on Adderall 20 mg twice daily for years.  She also takes namebrand Synthroid  175 mcg daily.  She states that she cannot tolerate generic levothyroxine .  At that time, my plan was:  have recommended supplementing the Wellbutrin  with Lexapro  10 mg a day to try to help reduce her anxiety and control it better.  I will not make any adjustments to her Adderall or Synthroid  at the present time.  Recommended that we recheck in 6 weeks to see how she is doing.  At that time I would like to obtain fasting lab work.  03/21/24 Wt Readings from Last 3 Encounters:  03/21/24 182 lb (82.6 kg)  02/29/24 180 lb 3.2 oz (81.7 kg)  12/17/23 192 lb (87.1 kg)   Over the weekend, the patient has noticed more swelling in her legs.  She has +1 pitting edema in both feet up to the mid shin  bilaterally.  Her mother and family also have a history of chronic venous insufficiency with varicose veins however she does not have any varicose veins.  She occasionally wears compression hose due to swelling in her legs after she suffered injuries to her feet.  However the swelling has been more serious this weekend than it ever has before.  She denies any chest pain or shortness of breath or dyspnea on exertion.  She denies any continence.  She denies consuming high quantity of sodium Past Medical History:  Diagnosis Date   Depression    Dysthymia 1999   Hypothyroidism    PCOS (polycystic ovarian syndrome) 2000   Prediabetes    Past Surgical History:  Procedure Laterality Date   CARPAL TUNNEL RELEASE Right 10/17/2015   DR. Whitfield   CARPAL TUNNEL RELEASE Left    CESAREAN SECTION     COSMETIC SURGERY     DILATION AND CURETTAGE OF UTERUS     ENDOMETRIAL ABLATION     FACIAL COSMETIC SURGERY     oralmaxillofacial surgery, broke jaws    FOOT SURGERY     NECK SURGERY  May 2006   WISDOM TOOTH EXTRACTION     Current Outpatient Medications on File Prior to Visit  Medication Sig Dispense Refill  ALPRAZolam  (XANAX ) 0.5 MG tablet Take 1 tablet (0.5 mg total) by mouth at bedtime as needed for sleep. 20 tablet 0   amphetamine -dextroamphetamine  (ADDERALL) 20 MG tablet Take one qam and one q noon 60 tablet 0   buPROPion  (WELLBUTRIN  SR) 200 MG 12 hr tablet TAKE 1 TABLET BY MOUTH TWICE DAILY 60 tablet 5   escitalopram  (LEXAPRO ) 10 MG tablet Take 1 tablet (10 mg total) by mouth daily. 30 tablet 5   estradiol (VIVELLE-DOT) 0.1 MG/24HR patch APPLY 1 PATCH TOPICALLY TO THE SKIN 2 TIMES A WEEK     fexofenadine (ALLEGRA) 180 MG tablet Take 180 mg by mouth daily.     hydrOXYzine  (VISTARIL ) 25 MG capsule Take 1 capsule (25 mg total) by mouth at bedtime as needed. 30 capsule 0   progesterone (PROMETRIUM) 100 MG capsule TAKE 1 CAPSULE BY MOUTH EVERY DAY AT BEDTIME     silver  sulfADIAZINE  (SILVADENE ) 1 %  cream Apply 1 Application topically daily. (Patient not taking: Reported on 02/29/2024) 25 g 1   SYNTHROID  175 MCG tablet TAKE 1/2  TABLET BY MOUTH ON SUNDAYS AND 1 WHOLE TABLET ON ALL THE OTHER DAYS (Patient taking differently: TAKE 1/2  TABLET BY MOUTH ON SUNDAYS AND 1 WHOLE TABLET ON ALL THE OTHER DAYS. PT NEEDS NAME BRAND.) 30 tablet 5   No current facility-administered medications on file prior to visit.   Allergies  Allergen Reactions   Augmentin [Amoxicillin-Pot Clavulanate]     Muscle soreness   Diclofenac  Other (See Comments)    Got GERD   Levaquin [Levofloxacin In D5w]     Stomach cramps and diarrhea   Penicillins Other (See Comments)   Tramadol      Severe headache, no pain relief.   Social History   Socioeconomic History   Marital status: Married    Spouse name: Not on file   Number of children: 1   Years of education: Not on file   Highest education level: Not on file  Occupational History   Occupation: stay at home  Tobacco Use   Smoking status: Never   Smokeless tobacco: Never  Vaping Use   Vaping status: Never Used  Substance and Sexual Activity   Alcohol use: Not Currently    Comment: RARELY   Drug use: No   Sexual activity: Not on file  Other Topics Concern   Not on file  Social History Narrative   Not on file   Social Drivers of Health   Financial Resource Strain: Not on file  Food Insecurity: Not on file  Transportation Needs: Not on file  Physical Activity: Not on file  Stress: Not on file  Social Connections: Not on file  Intimate Partner Violence: Not on file     Review of Systems  All other systems reviewed and are negative.      Objective:   Physical Exam Constitutional:      Appearance: Normal appearance. She is normal weight.  Cardiovascular:     Rate and Rhythm: Normal rate and regular rhythm.     Heart sounds: Normal heart sounds.  Pulmonary:     Effort: No respiratory distress.     Breath sounds: Normal breath sounds.   Neurological:     General: No focal deficit present.     Mental Status: She is alert and oriented to person, place, and time.     Cranial Nerves: No cranial nerve deficit.     Coordination: Coordination normal.     Gait: Gait normal.  Psychiatric:        Mood and Affect: Mood normal.        Behavior: Behavior normal.        Thought Content: Thought content normal.    +1 pitting edema in both feet      Assessment & Plan:   Leg swelling - Plan: Brain natriuretic peptide, CBC with Differential/Platelet, COMPLETE METABOLIC PANEL WITHOUT GFR Suspect leg swelling is due to chronic venous insufficiency.  Check BNP.  Check CMP.  If these are normal we will treat the patient with Lasix 20 mg p.o. daily as needed and recommend compression hose.  If BNP is elevated, consider echocardiogram

## 2024-03-22 ENCOUNTER — Encounter: Payer: Self-pay | Admitting: Family Medicine

## 2024-03-22 LAB — BRAIN NATRIURETIC PEPTIDE: Brain Natriuretic Peptide: 122 pg/mL — ABNORMAL HIGH (ref <100)

## 2024-03-23 ENCOUNTER — Encounter (HOSPITAL_COMMUNITY): Payer: Self-pay

## 2024-03-23 ENCOUNTER — Encounter: Payer: Self-pay | Admitting: Podiatry

## 2024-03-23 ENCOUNTER — Encounter: Payer: Self-pay | Admitting: Family Medicine

## 2024-03-23 ENCOUNTER — Other Ambulatory Visit: Payer: Self-pay

## 2024-03-23 DIAGNOSIS — R7989 Other specified abnormal findings of blood chemistry: Secondary | ICD-10-CM

## 2024-03-30 ENCOUNTER — Ambulatory Visit: Admitting: Family Medicine

## 2024-03-30 VITALS — BP 138/86 | HR 78 | Temp 98.6°F | Ht 66.0 in | Wt 176.0 lb

## 2024-03-30 DIAGNOSIS — E860 Dehydration: Secondary | ICD-10-CM | POA: Diagnosis not present

## 2024-03-30 DIAGNOSIS — R6883 Chills (without fever): Secondary | ICD-10-CM

## 2024-03-30 DIAGNOSIS — E039 Hypothyroidism, unspecified: Secondary | ICD-10-CM

## 2024-03-30 LAB — INFLUENZA A AND B AG, IMMUNOASSAY
INFLUENZA A ANTIGEN: NOT DETECTED
INFLUENZA B ANTIGEN: NOT DETECTED

## 2024-03-30 NOTE — Progress Notes (Signed)
 Subjective:    Patient ID: Bonnie [name], female    DOB: Apr 02, 1971, 53 y.o.   MRN: [id number]  URI    02/29/24 Patient is a very pleasant 53 year old Caucasian female who presents today to establish care.  She states that she has a long history of depression.  She has been on sustained-release Wellbutrin  200 mg twice a day for more than years.  This has worked well for her until recently.  She states that she is having panic attacks.  She feels anxious on a daily basis.  She is having trouble sleeping at night.  She also feels sad at times.  Most of this stems from stress caring for her husband who is a noncompliant diabetic.  She states that she has had to contact 911 due to a blood sugar of 14.  She found her husband lying in the floor unresponsive and foaming at the mouth.  This traumatized her.  However he will not check his blood sugars or take care of himself.  This only exacerbates her anxiety.  She feels helpless.  She denies any suicidal thoughts.  However she states that her anxiety recently became so bad that she got dehydrated because she had stopped eating.  She also has a history of hypothyroidism as well as ADD.  She has been on Adderall 20 mg twice daily for years.  She also takes namebrand Synthroid  175 mcg daily.  She states that she cannot tolerate generic levothyroxine .  At that time, my plan was:  have recommended supplementing the Wellbutrin  with Lexapro  10 mg a day to try to help reduce her anxiety and control it better.  I will not make any adjustments to her Adderall or Synthroid  at the present time.  Recommended that we recheck in 6 weeks to see how she is doing.  At that time I would like to obtain fasting lab work.  03/30/24 Wt Readings from Last 3 Encounters:  03/30/24 176 lb (79.8 kg)  03/21/24 182 lb (82.6 kg)  02/29/24 180 lb 3.2 oz (81.7 kg)   After the last visit, started the patient on Lasix  20 mg a day as needed for leg swelling.  She stated that the swelling  was getting worse so I increased her Lasix  to 40 mg a day.  Since I last saw the patient 9 days ago, she has lost 6 pounds.  She reports dry mouth and feeling tired.  She clinically looks dehydrated.  However beginning a few days ago she developed flulike symptoms.  She reports fever and chills.  She reports body aches.  She reports congestion.  Flu test today was negative.  She denies any shortness of breath or chest pain.  She denies vomiting or diarrhea. Past Medical History:  Diagnosis Date   Depression    Dysthymia 1999   Hypothyroidism    PCOS (polycystic ovarian syndrome) 2000   Prediabetes    Past Surgical History:  Procedure Laterality Date   CARPAL TUNNEL RELEASE Right 10/17/2015   DR. Whitfield   CARPAL TUNNEL RELEASE Left    CESAREAN SECTION     COSMETIC SURGERY     DILATION AND CURETTAGE OF UTERUS     ENDOMETRIAL ABLATION     FACIAL COSMETIC SURGERY     oralmaxillofacial surgery, broke jaws    FOOT SURGERY     NECK SURGERY  May 2006   WISDOM TOOTH EXTRACTION     Current Outpatient Medications on File Prior to Visit  Medication Sig  Dispense Refill   ALPRAZolam  (XANAX ) 0.5 MG tablet Take 1 tablet (0.5 mg total) by mouth at bedtime as needed for sleep. 20 tablet 0   amphetamine -dextroamphetamine  (ADDERALL) 20 MG tablet Take one qam and one q noon 60 tablet 0   buPROPion  (WELLBUTRIN  SR) 200 MG 12 hr tablet TAKE 1 TABLET BY MOUTH TWICE DAILY 60 tablet 5   escitalopram  (LEXAPRO ) 10 MG tablet Take 1 tablet (10 mg total) by mouth daily. 30 tablet 5   estradiol (VIVELLE-DOT) 0.1 MG/24HR patch APPLY 1 PATCH TOPICALLY TO THE SKIN 2 TIMES A WEEK     fexofenadine (ALLEGRA) 180 MG tablet Take 180 mg by mouth daily.     furosemide  (LASIX ) 20 MG tablet Take 1 tablet (20 mg total) by mouth daily. 30 tablet 3   hydrOXYzine  (VISTARIL ) 25 MG capsule Take 1 capsule (25 mg total) by mouth at bedtime as needed. 30 capsule 0   progesterone (PROMETRIUM) 100 MG capsule TAKE 1 CAPSULE BY MOUTH  EVERY DAY AT BEDTIME     silver  sulfADIAZINE  (SILVADENE ) 1 % cream Apply 1 Application topically daily. (Patient not taking: Reported on 02/29/2024) 25 g 1   SYNTHROID  175 MCG tablet TAKE 1/2  TABLET BY MOUTH ON SUNDAYS AND 1 WHOLE TABLET ON ALL THE OTHER DAYS (Patient taking differently: TAKE 1/2  TABLET BY MOUTH ON SUNDAYS AND 1 WHOLE TABLET ON ALL THE OTHER DAYS. PT NEEDS NAME BRAND.) 30 tablet 5   No current facility-administered medications on file prior to visit.   Allergies  Allergen Reactions   Augmentin [Amoxicillin-Pot Clavulanate]     Muscle soreness   Diclofenac  Other (See Comments)    Got GERD   Levaquin [Levofloxacin In D5w]     Stomach cramps and diarrhea   Penicillins Other (See Comments)   Tramadol      Severe headache, no pain relief.   Social History   Socioeconomic History   Marital status: Married    Spouse name: Not on file   Number of children: 1   Years of education: Not on file   Highest education level: Not on file  Occupational History   Occupation: stay at home  Tobacco Use   Smoking status: Never   Smokeless tobacco: Never  Vaping Use   Vaping status: Never Used  Substance and Sexual Activity   Alcohol use: Not Currently    Comment: RARELY   Drug use: No   Sexual activity: Not on file  Other Topics Concern   Not on file  Social History Narrative   Not on file   Social Drivers of Health   Financial Resource Strain: Not on file  Food Insecurity: Not on file  Transportation Needs: Not on file  Physical Activity: Not on file  Stress: Not on file  Social Connections: Not on file  Intimate Partner Violence: Not on file     Review of Systems  All other systems reviewed and are negative.      Objective:   Physical Exam Constitutional:      Appearance: Normal appearance. She is normal weight.  Cardiovascular:     Rate and Rhythm: Normal rate and regular rhythm.     Heart sounds: Normal heart sounds.  Pulmonary:     Effort: No  respiratory distress.     Breath sounds: Normal breath sounds.  Neurological:     General: No focal deficit present.     Mental Status: She is alert and oriented to person, place, and time.  Cranial Nerves: No cranial nerve deficit.     Coordination: Coordination normal.     Gait: Gait normal.  Psychiatric:        Mood and Affect: Mood normal.        Behavior: Behavior normal.        Thought Content: Thought content normal.        Assessment & Plan:   Chills (without fever) - Plan: Influenza A and B Ag, Immunoassay I suspect the patient's legs with chronic venous insufficiency.  However I believe that she is dehydrated now due to excessive use of Lasix .  I recommended stopping the Lasix  and pushing fluids.  Flu test today is negative.  Recommended checking a COVID test when she gets home.  I suspect that she likely has a viral upper respiratory infection.  Also the patient states the Lexapro  is helping with her anxiety.  Therefore we will continue this.

## 2024-03-31 ENCOUNTER — Ambulatory Visit: Payer: Self-pay | Admitting: Family Medicine

## 2024-03-31 ENCOUNTER — Other Ambulatory Visit: Payer: Self-pay

## 2024-03-31 DIAGNOSIS — E039 Hypothyroidism, unspecified: Secondary | ICD-10-CM

## 2024-03-31 LAB — COMPREHENSIVE METABOLIC PANEL WITH GFR
AG Ratio: 1.9 (calc) (ref 1.0–2.5)
ALT: 19 U/L (ref 6–29)
AST: 19 U/L (ref 10–35)
Albumin: 3.9 g/dL (ref 3.6–5.1)
Alkaline phosphatase (APISO): 81 U/L (ref 37–153)
BUN: 8 mg/dL (ref 7–25)
CO2: 24 mmol/L (ref 20–32)
Calcium: 9.2 mg/dL (ref 8.6–10.4)
Chloride: 101 mmol/L (ref 98–110)
Creat: 0.68 mg/dL (ref 0.50–1.03)
Globulin: 2.1 g/dL (ref 1.9–3.7)
Glucose, Bld: 136 mg/dL — ABNORMAL HIGH (ref 65–99)
Potassium: 3.8 mmol/L (ref 3.5–5.3)
Sodium: 137 mmol/L (ref 135–146)
Total Bilirubin: 0.5 mg/dL (ref 0.2–1.2)
Total Protein: 6 g/dL — ABNORMAL LOW (ref 6.1–8.1)
eGFR: 104 mL/min/{1.73_m2} (ref >=60)

## 2024-03-31 LAB — TSH: TSH: 0.01 m[IU]/L — ABNORMAL LOW

## 2024-03-31 MED ORDER — SYNTHROID 150 MCG PO TABS: 150.0000 ug | ORAL_TABLET | Freq: Every day | ORAL | 1 refills | Status: DC

## 2024-04-03 ENCOUNTER — Ambulatory Visit: Payer: Self-pay | Admitting: *Deleted

## 2024-04-03 ENCOUNTER — Telehealth: Payer: Self-pay | Admitting: Pharmacy Technician

## 2024-04-03 ENCOUNTER — Other Ambulatory Visit (HOSPITAL_COMMUNITY): Payer: Self-pay

## 2024-04-03 ENCOUNTER — Other Ambulatory Visit: Payer: Self-pay | Admitting: Family Medicine

## 2024-04-03 MED ORDER — ONDANSETRON HCL 4 MG PO TABS: 4.0000 mg | ORAL_TABLET | Freq: Three times a day (TID) | ORAL | 0 refills | Status: AC | PRN

## 2024-04-03 NOTE — Telephone Encounter (Signed)
 Chief Complaint: weakness, diarrhea dizziness, requesting to inform PCP of sx but reports she is too weak to come to OV due to caring for special needs daughter. Symptoms: night sweats, clothes soaked at night, did not report fever. Weakness standing, nausea, dry heaves at times. Diarrhea 2 times yesterday. Not able to control diarrhea. Dizziness reported but not to the point of not walking. Reports "drinking my weight in Gatorade and legs not swelling". Unable to tolerate food. Reports feeling to weak to drive for appt.  Frequency: over weekend and diarrhea yesterday  Pertinent Negatives: Patient denies chest pain no difficulty breathing no fever reported no episodes of passing out Disposition: [] ED /[] Urgent Care (no appt availability in office) / [] Appointment(In office/virtual)/ []  [facility]/ [] Home Care/ [x] Refused Recommended Disposition /[] [facility]/ []  Follow-up with PCP Additional Notes:   Recommended patient be evaluated and patient reports she is too weak to drive and had to cancel special needs daughter appt for today . Please advise for any recommendations. Instructed patient if sx worsened call 911 for assist. Recommended to continue hydration with gatorade and water and bland diet foods. Bananas bread, pretzels, crackers. Please advise.       Copied from CRM [id number]. Topic: Clinical - Red Word Triage >> Apr 03, 2024 10:19 AM Elle L wrote: Red Word that prompted transfer to Nurse Triage: The patient was seen Thursday for swelling and was put on fluid pills but states she has been experiencing worsening diarrhea, dehydration, and weakness. Reason for Disposition  [1] Mild diarrhea (e.g., 1-3 or more stools than normal in past 24 hours) without known cause AND [2] present >  7 days  Answer Assessment - Initial Assessment Questions 1. DIARRHEA SEVERITY: "How bad is the diarrhea?" "How many more stools have you had in the past 24 hours than normal?"    - NO  DIARRHEA (SCALE 0)   - MILD (SCALE 1-3): Few loose or mushy BMs; increase of 1-3 stools over normal daily number of stools; mild increase in ostomy output.   -  MODERATE (SCALE 4-7): Increase of 4-6 stools daily over normal; moderate increase in ostomy output.   -  SEVERE (SCALE 8-10; OR "WORST POSSIBLE"): Increase of 7 or more stools daily over normal; moderate increase in ostomy output; incontinence.     2 times yesterday  2. ONSET: "When did the diarrhea begin?"      On going but reported  3. BM CONSISTENCY: "How loose or watery is the diarrhea?"      Loose  4. VOMITING: "Are you also vomiting?" If Yes, ask: "How many times in the past 24 hours?"      Dry heaves  5. ABDOMEN PAIN: "Are you having any abdomen pain?" If Yes, ask: "What does it feel like?" (e.g., crampy, dull, intermittent, constant)      no 6. ABDOMEN PAIN SEVERITY: If present, ask: "How bad is the pain?"  (e.g., Scale 1-10; mild, moderate, or severe)   - MILD (1-3): doesn't interfere with normal activities, abdomen soft and not tender to touch    - MODERATE (4-7): interferes with normal activities or awakens from sleep, abdomen tender to touch    - SEVERE (8-10): excruciating pain, doubled over, unable to do any normal activities       no 7. ORAL INTAKE: If vomiting, "Have you been able to drink liquids?" "How much liquids have you had in the past 24 hours?"     Able to tolerate gatorade 8.  HYDRATION: "Any signs of dehydration?" (e.g., dry mouth [not just dry lips], too weak to stand, dizziness, new weight loss) "When did you last urinate?"     Dizziness and feels weakn 9. EXPOSURE: "Have you traveled to a foreign country recently?" "Have you been exposed to anyone with diarrhea?" "Could you have eaten any food that was spoiled?"     na 10. ANTIBIOTIC USE: "Are you taking antibiotics now or have you taken antibiotics in the past 2 months?"       na 11. OTHER SYMPTOMS: "Do you have any other symptoms?" (e.g., fever, blood  in stool)       Diarrhea x 2 yesterday,  feels weakness and dizziness with standing.  12. PREGNANCY: "Is there any chance you are pregnant?" "When was your last menstrual period?"       na  Protocols used: [facility]

## 2024-04-03 NOTE — Telephone Encounter (Signed)
 Pharmacy Patient Advocate Encounter   Received notification from Onbase that prior authorization for Synthroid  tablets is required/requested.   Insurance verification completed.   The patient is insured through [employer] .   Per test claim: PA required; PA submitted to above mentioned insurance via CoverMyMeds Key/confirmation #/EOC [id number] Status is pending

## 2024-04-04 ENCOUNTER — Telehealth: Payer: Self-pay

## 2024-04-04 NOTE — Telephone Encounter (Signed)
 Copied from CRM [id number]. Topic: Clinical - Request for Lab/Test Order >> Apr 04, 2024  1:06 PM Fonda T wrote: Reason for CRM: Dorothy from [facility] calling on behalf of patient.  Requesting a return call regarding order for echocardiogram. Per caller states there is a note indicating patient's Insurance does not cover echocardiogram test, wants to know if patient should still have test done.   Please call to advise further at Ph. [phone]

## 2024-04-06 ENCOUNTER — Other Ambulatory Visit (HOSPITAL_COMMUNITY): Payer: Self-pay

## 2024-04-11 ENCOUNTER — Other Ambulatory Visit (HOSPITAL_COMMUNITY): Payer: Self-pay

## 2024-04-11 NOTE — Telephone Encounter (Signed)
 Pharmacy Patient Advocate Encounter  Received notification from [facility] that Prior Authorization for Synthroid  tablets  has been DENIED.  Full letter of denial was uploaded to pt's media tab   PA #/Case ID/Reference #: [id number]

## 2024-04-14 ENCOUNTER — Encounter: Payer: Self-pay | Admitting: Podiatry

## 2024-04-14 ENCOUNTER — Encounter: Payer: Self-pay | Admitting: Family Medicine

## 2024-04-18 ENCOUNTER — Other Ambulatory Visit: Payer: Self-pay | Admitting: Family Medicine

## 2024-04-18 ENCOUNTER — Encounter: Payer: Self-pay | Admitting: Podiatry

## 2024-04-18 ENCOUNTER — Ambulatory Visit (INDEPENDENT_AMBULATORY_CARE_PROVIDER_SITE_OTHER): Admitting: Podiatry

## 2024-04-18 VITALS — Ht 66.0 in | Wt 176.0 lb

## 2024-04-18 DIAGNOSIS — M2011 Hallux valgus (acquired), right foot: Secondary | ICD-10-CM

## 2024-04-18 MED ORDER — GABAPENTIN 300 MG PO CAPS: 300.0000 mg | ORAL_CAPSULE | Freq: Three times a day (TID) | ORAL | 3 refills | Status: AC

## 2024-04-18 MED ORDER — METHYLPREDNISOLONE 4 MG PO TBPK: ORAL_TABLET | ORAL | 0 refills | Status: AC

## 2024-04-18 MED ORDER — BUPROPION HCL ER (SR) 200 MG PO TB12: ORAL_TABLET | ORAL | 5 refills | Status: DC

## 2024-04-18 MED ORDER — MELOXICAM 15 MG PO TABS: 15.0000 mg | ORAL_TABLET | Freq: Every day | ORAL | 1 refills | Status: AC

## 2024-04-18 NOTE — Progress Notes (Signed)
 Chief Complaint  Patient presents with   Foot Pain    Pt is here due to bilateral foot pain and swelling she states she has a longer history of foot issues states for the last month or so she been having a burning sensation to heels of her feet and swelling states she is on her feet a lot.    Subjective:  53 year old female PMHx prediabetes presenting for chronic bilateral foot pain.  History of bilateral foot surgery.  Presenting for further treatment evaluation  Past Medical History:  Diagnosis Date   Depression    Dysthymia 1999   Hypothyroidism    PCOS (polycystic ovarian syndrome) 2000   Prediabetes    Past Surgical History:  Procedure Laterality Date   CARPAL TUNNEL RELEASE Right 10/17/2015   DR. Whitfield   CARPAL TUNNEL RELEASE Left    CESAREAN SECTION     COSMETIC SURGERY     DILATION AND CURETTAGE OF UTERUS     ENDOMETRIAL ABLATION     FACIAL COSMETIC SURGERY     oralmaxillofacial surgery, broke jaws    FOOT SURGERY     NECK SURGERY  May 2006   WISDOM TOOTH EXTRACTION     Allergies  Allergen Reactions   Augmentin [Amoxicillin-Pot Clavulanate]     Muscle soreness   Diclofenac  Other (See Comments)    Got GERD   Levaquin [Levofloxacin In D5w]     Stomach cramps and diarrhea   Penicillins Other (See Comments)   Tramadol      Severe headache, no pain relief.    Objective: Physical Exam General: The patient is alert and oriented x3 in no acute distress.  Dermatology: Skin is cool, dry and supple bilateral lower extremities. Negative for open lesions or macerations.  Vascular: Palpable pedal pulses bilaterally. No edema or erythema noted. Capillary refill within normal limits.  Neurological: Grossly intact via light touch  Musculoskeletal Exam: Recurrent hallux valgus deformity noted with lateral deviation of the hallux and prominent medial eminence of the metatarsal head.  Exacerbated with weightbearing.  Please see above noted photo.  Crossover deformity  of the great toe overlying the second digit.  Radiographic exam LT foot 12/22/2022: Normal osseous mineralization.  Prior history of surgery.  No acute fractures identified  Radiographic exam RT foot 06/29/2023: Recurrent increased intermetatarsal angle with an increased hallux abductus angle noted to the right foot.  History of prior bunion surgery with orthopedic screws to the first metatarsal of the right foot.  Assessment: 1.  Capsulitis/DJD left foot.  Metatarsalgia bilateral forefoot 2.  Gait instability bilateral lower extremities 3.  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  History of bunionectomy and forefoot surgery right.  DOS: 09/25/2021  -Patient evaluated.   -Unfortunately the patient continues to have pain and symptoms associated to her recurrent hallux valgus deformity of the right great toe.  Last visit authorization for surgery was initiated however she was unable to proceed with surgery at that time. -For now we will continue to pursue conservative treatment management. -Recommend good supportive tennis shoes and sneakers.  Advise against going barefoot -Recommend compression hose daily -Return to clinic PRN  Dot Gazella, DPM [facility]  Dr. Dot Gazella, DPM    [address]                                    [place],  [place] [address]                Office [phone]  Fax [phone]

## 2024-04-18 NOTE — Telephone Encounter (Signed)
 Copied from CRM [id number]. Topic: Clinical - Medication Refill >> Apr 18, 2024 11:09 AM Tiffany S wrote: Medication: buPROPion  (WELLBUTRIN  SR) 200 MG 12 hr tablet [[id number]] Patient is out of the medication patient is asking if this can be expedited  Has the patient contacted their pharmacy? Yes (Agent: If no, request that the patient contact the pharmacy for the refill. If patient does not wish to contact the pharmacy document the reason why and proceed with request.) (Agent: If yes, when and what did the pharmacy advise?)  This is the patient's preferred pharmacy:  [id number] - [place], [place] - [address] [place] [address] Phone: [phone] Fax: [phone]  Is this the correct pharmacy for this prescription? Yes If no, delete pharmacy and type the correct one.   Has the prescription been filled recently? Yes  Is the patient out of the medication? Yes  Has the patient been seen for an appointment in the last year OR does the patient have an upcoming appointment? Yes  Can we respond through MyChart? Yes  Agent: Please be advised that Rx refills may take up to 3 business days. We ask that you follow-up with your pharmacy.

## 2024-04-18 NOTE — Telephone Encounter (Unsigned)
 Copied from CRM [id number]. Topic: Clinical - Medication Refill >> Apr 18, 2024 11:09 AM Tiffany S wrote: Medication: buPROPion  (WELLBUTRIN  SR) 200 MG 12 hr tablet [[id number]] Patient is out of the medication patient is asking if this can be expedited  Has the patient contacted their pharmacy? Yes (Agent: If no, request that the patient contact the pharmacy for the refill. If patient does not wish to contact the pharmacy document the reason why and proceed with request.) (Agent: If yes, when and what did the pharmacy advise?)  This is the patient's preferred pharmacy:  [id number] - [place], [place] - [address] [place] [address] Phone: [phone] Fax: [phone]  Is this the correct pharmacy for this prescription? Yes If no, delete pharmacy and type the correct one.   Has the prescription been filled recently? Yes  Is the patient out of the medication? Yes  Has the patient been seen for an appointment in the last year OR does the patient have an upcoming appointment? Yes  Can we respond through MyChart? Yes  Agent: Please be advised that Rx refills may take up to 3 business days. We ask that you follow-up with your pharmacy.

## 2024-04-18 NOTE — Telephone Encounter (Signed)
 First attempt to contact pt. For more information . Wellbutrin  is in need of a refill ( last refill 01/2023) . However, Lexapro  has refills as well as synthroid . WCL

## 2024-04-19 NOTE — Telephone Encounter (Signed)
 Refill approved and sent in by provider to pharmacy on file.

## 2024-04-24 ENCOUNTER — Encounter: Payer: Self-pay | Admitting: Family Medicine

## 2024-04-25 ENCOUNTER — Other Ambulatory Visit: Payer: Self-pay | Admitting: Family Medicine

## 2024-04-25 MED ORDER — AMPHETAMINE-DEXTROAMPHETAMINE 20 MG PO TABS: ORAL_TABLET | ORAL | 0 refills | Status: DC

## 2024-04-25 MED ORDER — BUPROPION HCL ER (SR) 200 MG PO TB12: ORAL_TABLET | ORAL | 5 refills | Status: DC

## 2024-05-25 ENCOUNTER — Encounter: Payer: Self-pay | Admitting: Family Medicine

## 2024-05-25 ENCOUNTER — Other Ambulatory Visit: Payer: Self-pay | Admitting: Family Medicine

## 2024-05-25 MED ORDER — AMPHETAMINE-DEXTROAMPHETAMINE 20 MG PO TABS: ORAL_TABLET | ORAL | 0 refills | Status: DC

## 2024-06-23 ENCOUNTER — Other Ambulatory Visit: Payer: Self-pay | Admitting: Family Medicine

## 2024-06-23 ENCOUNTER — Encounter: Payer: Self-pay | Admitting: Family Medicine

## 2024-06-23 NOTE — Telephone Encounter (Signed)
 Copied from CRM [id number]. Topic: Clinical - Medication Refill >> Jun 23, 2024 10:41 AM Bonnie [name] wrote: Medication: amphetamine -dextroamphetamine  (ADDERALL) 20 MG tablet  Has the patient contacted their pharmacy? No (Agent: If no, request that the patient contact the pharmacy for the refill. If patient does not wish to contact the pharmacy document the reason why and proceed with request.) (Agent: If yes, when and what did the pharmacy advise?)  This is the patient's preferred pharmacy:  [id number] - [place], [place] - [name] [place] [address] Phone: [phone] Fax: [phone]  Is this the correct pharmacy for this prescription? Yes If no, delete pharmacy and type the correct one.   Has the prescription been filled recently? Yes  Is the patient out of the medication? No, 2 remaining   Has the patient been seen for an appointment in the last year OR does the patient have an upcoming appointment? Yes  Can we respond through MyChart? No  Agent: Please be advised that Rx refills may take up to 3 business days. We ask that you follow-up with your pharmacy.

## 2024-06-25 ENCOUNTER — Other Ambulatory Visit: Payer: Self-pay | Admitting: Family Medicine

## 2024-06-25 MED ORDER — AMPHETAMINE-DEXTROAMPHETAMINE 20 MG PO TABS: ORAL_TABLET | ORAL | 0 refills | Status: DC

## 2024-06-27 NOTE — Telephone Encounter (Signed)
 Requested medication (s) are due for refill today: no  Requested medication (s) are on the active medication list:yes  Last refill:  06/25/24 #60 tabs  Future visit scheduled: no  Notes to clinic:  med not delegated to NT to Refuse   Requested Prescriptions  Pending Prescriptions Disp Refills   amphetamine -dextroamphetamine  (ADDERALL) 20 MG tablet 60 tablet 0    Sig: Take one qam and one q noon     Not Delegated - Psychiatry:  Stimulants/ADHD Failed - 06/27/2024 12:43 PM      Failed - This refill cannot be delegated      Failed - Urine Drug Screen completed in last 360 days      Passed - Last BP in normal range    BP Readings from Last 1 Encounters:  03/30/24 138/86         Passed - Last Heart Rate in normal range    Pulse Readings from Last 1 Encounters:  03/30/24 78         Passed - Valid encounter within last 6 months    Recent Outpatient Visits           2 months ago Chills (without fever)   [facility] Duanne, Butler DASEN, MD   3 months ago Leg swelling   [facility] Duanne Butler DASEN, MD   3 months ago Hypothyroidism, unspecified type   [facility] Pickard, Butler DASEN, MD

## 2024-06-29 ENCOUNTER — Telehealth: Payer: Self-pay

## 2024-06-29 MED ORDER — AMPHETAMINE-DEXTROAMPHETAMINE 20 MG PO TABS: ORAL_TABLET | ORAL | 0 refills | Status: DC

## 2024-06-29 NOTE — Telephone Encounter (Signed)
 Copied from CRM [id number]. Topic: Clinical - Prescription Issue >> Jun 29, 2024  2:33 PM Deleta RAMAN wrote: Reason for CRM: patient prescription is currently on back order Amphetamine -Dextroamphetamine . Pharmacy notified patient that the refill may have to be lowered to 10 mg for refill

## 2024-06-30 ENCOUNTER — Other Ambulatory Visit: Payer: Self-pay | Admitting: Family Medicine

## 2024-06-30 MED ORDER — AMPHETAMINE-DEXTROAMPHETAMINE 10 MG PO TABS: 20.0000 mg | ORAL_TABLET | Freq: Two times a day (BID) | ORAL | 0 refills | Status: DC

## 2024-07-13 ENCOUNTER — Ambulatory Visit: Admitting: Family Medicine

## 2024-07-25 ENCOUNTER — Ambulatory Visit (INDEPENDENT_AMBULATORY_CARE_PROVIDER_SITE_OTHER): Admitting: Family Medicine

## 2024-07-25 ENCOUNTER — Encounter: Payer: Self-pay | Admitting: Family Medicine

## 2024-07-25 ENCOUNTER — Ambulatory Visit: Admitting: Family Medicine

## 2024-07-25 VITALS — BP 120/78 | HR 75 | Temp 97.6°F | Ht 66.0 in | Wt 187.0 lb

## 2024-07-25 DIAGNOSIS — R051 Acute cough: Secondary | ICD-10-CM | POA: Diagnosis not present

## 2024-07-25 MED ORDER — MONTELUKAST SODIUM 10 MG PO TABS: 10.0000 mg | ORAL_TABLET | Freq: Every day | ORAL | 3 refills | Status: AC

## 2024-07-25 MED ORDER — HYDROCOD POLI-CHLORPHE POLI ER 10-8 MG/5ML PO SUER: 5.0000 mL | Freq: Two times a day (BID) | ORAL | 0 refills | Status: AC | PRN

## 2024-07-25 NOTE — Progress Notes (Signed)
 Subjective:    Patient ID: Bonnie [name], female    DOB: 19-Aug-1971, 53 y.o.   MRN: [id number]  URI  Associated symptoms include coughing.  Cough  Medication Refill Associated symptoms include coughing.   Patient has had a cough for approximately 1 week.  She also reports runny nose, itchy nose, sneezing, postnasal drip, hoarseness.  She denies fevers and chills.  However the cough is keeping her awake at night.  She denies any shortness of breath or pleurisy or hemoptysis or wheezing.  She denies any chest pain. Past Medical History:  Diagnosis Date   Depression    Dysthymia 1999   Hypothyroidism    PCOS (polycystic ovarian syndrome) 2000   Prediabetes    Past Surgical History:  Procedure Laterality Date   CARPAL TUNNEL RELEASE Right 10/17/2015   DR. Whitfield   CARPAL TUNNEL RELEASE Left    CESAREAN SECTION     COSMETIC SURGERY     DILATION AND CURETTAGE OF UTERUS     ENDOMETRIAL ABLATION     FACIAL COSMETIC SURGERY     oralmaxillofacial surgery, broke jaws    FOOT SURGERY     NECK SURGERY  May 2006   WISDOM TOOTH EXTRACTION     Current Outpatient Medications on File Prior to Visit  Medication Sig Dispense Refill   ALPRAZolam  (XANAX ) 0.5 MG tablet Take 1 tablet (0.5 mg total) by mouth at bedtime as needed for sleep. 20 tablet 0   amphetamine -dextroamphetamine  (ADDERALL) 10 MG tablet Take 2 tablets (20 mg total) by mouth 2 (two) times daily. 120 tablet 0   buPROPion  (WELLBUTRIN  SR) 200 MG 12 hr tablet TAKE 1 TABLET BY MOUTH TWICE DAILY 60 tablet 5   escitalopram  (LEXAPRO ) 10 MG tablet Take 1 tablet (10 mg total) by mouth daily. 30 tablet 5   estradiol (VIVELLE-DOT) 0.1 MG/24HR patch APPLY 1 PATCH TOPICALLY TO THE SKIN 2 TIMES A WEEK     fexofenadine (ALLEGRA) 180 MG tablet Take 180 mg by mouth daily.     furosemide  (LASIX ) 20 MG tablet Take 1 tablet (20 mg total) by mouth daily. 30 tablet 3   gabapentin  (NEURONTIN ) 300 MG capsule Take 1 capsule (300 mg total) by  mouth 3 (three) times daily. 90 capsule 3   hydrOXYzine  (VISTARIL ) 25 MG capsule Take 1 capsule (25 mg total) by mouth at bedtime as needed. 30 capsule 0   meloxicam  (MOBIC ) 15 MG tablet Take 1 tablet (15 mg total) by mouth daily. 60 tablet 1   methylPREDNISolone  (MEDROL  DOSEPAK) 4 MG TBPK tablet 6 day dose pack - take as directed 21 tablet 0   ondansetron  (ZOFRAN ) 4 MG tablet Take 1 tablet (4 mg total) by mouth every 8 (eight) hours as needed for nausea or vomiting. 20 tablet 0   progesterone (PROMETRIUM) 100 MG capsule TAKE 1 CAPSULE BY MOUTH EVERY DAY AT BEDTIME     silver  sulfADIAZINE  (SILVADENE ) 1 % cream Apply 1 Application topically daily. (Patient not taking: Reported on 04/18/2024) 25 g 1   SYNTHROID  150 MCG tablet Take 1 tablet (150 mcg total) by mouth daily before breakfast. PT REQUIRES NAME BRAND. 90 tablet 1   valACYclovir (VALTREX) 500 MG tablet Take 500 mg by mouth daily.     No current facility-administered medications on file prior to visit.   Allergies  Allergen Reactions   Augmentin [Amoxicillin-Pot Clavulanate]     Muscle soreness   Diclofenac  Other (See Comments)    Got GERD   Levaquin [  Levofloxacin In D5w]     Stomach cramps and diarrhea   Penicillins Other (See Comments)   Tramadol      Severe headache, no pain relief.   Social History   Socioeconomic History   Marital status: Married    Spouse name: Not on file   Number of children: 1   Years of education: Not on file   Highest education level: Not on file  Occupational History   Occupation: stay at home  Tobacco Use   Smoking status: Never   Smokeless tobacco: Never  Vaping Use   Vaping status: Never Used  Substance and Sexual Activity   Alcohol use: Not Currently    Comment: RARELY   Drug use: No   Sexual activity: Not on file  Other Topics Concern   Not on file  Social History Narrative   Not on file   Social Drivers of Health   Financial Resource Strain: Not on file  Food Insecurity: Not on  file  Transportation Needs: Not on file  Physical Activity: Not on file  Stress: Not on file  Social Connections: Not on file  Intimate Partner Violence: Not on file     Review of Systems  Respiratory:  Positive for cough.   All other systems reviewed and are negative.      Objective:   Physical Exam Constitutional:      Appearance: Normal appearance. She is normal weight.  HENT:     Right Ear: Tympanic membrane and ear canal normal.     Left Ear: Tympanic membrane and ear canal normal.     Nose: Congestion present.     Mouth/Throat:     Pharynx: Oropharynx is clear.  Cardiovascular:     Rate and Rhythm: Normal rate and regular rhythm.     Heart sounds: Normal heart sounds.  Pulmonary:     Effort: No respiratory distress.     Breath sounds: Normal breath sounds. No stridor. No wheezing, rhonchi or rales.  Chest:     Chest wall: No tenderness.  Lymphadenopathy:     Cervical: No cervical adenopathy.  Neurological:     General: No focal deficit present.     Mental Status: She is alert and oriented to person, place, and time.     Cranial Nerves: No cranial nerve deficit.     Coordination: Coordination normal.     Gait: Gait normal.  Psychiatric:        Mood and Affect: Mood normal.        Behavior: Behavior normal.        Thought Content: Thought content normal.        Assessment & Plan:   Acute cough I believe his cough is due to seasonal allergies.  However the patient has been taking Flonase and Allegra for a week with no benefit.  Will go to add Singulair  10 mg daily and also Tussionex 1 teaspoon every 12 hours as needed for cough and reassess in 1 week or sooner if worse.  If she develops fevers or chills or purulent sputum, I will change direction and treat possible infection however her exam today suggest allergies

## 2024-08-04 ENCOUNTER — Other Ambulatory Visit: Payer: Self-pay | Admitting: Family Medicine

## 2024-08-04 MED ORDER — BUPROPION HCL ER (SR) 200 MG PO TB12: ORAL_TABLET | ORAL | 3 refills | Status: AC

## 2024-08-04 MED ORDER — AMPHETAMINE-DEXTROAMPHETAMINE 10 MG PO TABS: 20.0000 mg | ORAL_TABLET | Freq: Two times a day (BID) | ORAL | 0 refills | Status: DC

## 2024-08-31 ENCOUNTER — Other Ambulatory Visit: Payer: Self-pay | Admitting: Family Medicine

## 2024-09-01 ENCOUNTER — Ambulatory Visit: Admitting: Family Medicine

## 2024-09-04 ENCOUNTER — Ambulatory Visit: Admitting: Family Medicine

## 2024-09-25 ENCOUNTER — Ambulatory Visit: Payer: Self-pay

## 2024-09-25 NOTE — Telephone Encounter (Signed)
 FYI Only or Action Required?: FYI only for provider: pt refuses ED, willing to go to UC.  Patient was last seen in primary care on 07/25/2024 by Duanne Butler DASEN, MD.  Called Nurse Triage reporting Dizziness.  Symptoms began several days ago.  Interventions attempted: Nothing.  Symptoms are: unchanged.  Triage Disposition: See Physician Within 24 Hours  Patient/caregiver understands and will follow disposition?: Yes, will follow disposition  Copied from CRM #[id number]. Topic: Clinical - Red Word Triage >> Sep 25, 2024  3:32 PM Tobias CROME wrote: Red Word that prompted transfer to Nurse Triage: patient fell Saturday night, pretty bruised on forehead Has had some dizzy spells Reason for Disposition  [1] MODERATE dizziness (e.g., interferes with normal activities) AND [2] has NOT been evaluated by doctor (or NP/PA) for this  (Exception: Dizziness caused by heat exposure, sudden standing, or poor fluid intake.)  Answer Assessment - Initial Assessment Questions 1. DESCRIPTION: Describe your dizziness.     Pt unable to describe 2. LIGHTHEADED: Do you feel lightheaded? (e.g., somewhat faint, woozy, weak upon standing)     Standing and walking without concern 4. SEVERITY: How bad is it?  Do you feel like you are going to faint? Can you stand and walk?     Can stand and walk without concern at this time 5. ONSET:  When did the dizziness begin?     After fall about 2 days ago 6. AGGRAVATING FACTORS: Does anything make it worse? (e.g., standing, change in head position)     States nothing makes dizziness better nor worse 7. HEART RATE: Can you tell me your heart rate? How many beats in 15 seconds?  (Note: Not all patients can do this.)       unsure 8. CAUSE: What do you think is causing the dizziness? (e.g., decreased fluids or food, diarrhea, emotional distress, heat exposure, new medicine, sudden standing, vomiting; unknown)     Pt had fall 2 days ago, unsure how long she was  unconsciousness 10. OTHER SYMPTOMS: Do you have any other symptoms? (e.g., fever, chest pain, vomiting, diarrhea, bleeding)       Denies vision changes, denies N/V, denies blood thinner use.  Pt states that she has had 2 episodes of dizziness since her fall 2 days ago. Pt denies mechanical fall, states that she probably had a drop in her BP. Pt not willing to go to the ED, wants to see PCP, no appts avail in protocol time frame. Pt willing to go to the UC, also requested the next appt with PCP.  Protocols used: Dizziness - Lightheadedness-A-AH

## 2024-09-29 ENCOUNTER — Encounter: Payer: Self-pay | Admitting: Family Medicine

## 2024-09-29 ENCOUNTER — Ambulatory Visit: Attending: Family Medicine

## 2024-09-29 ENCOUNTER — Telehealth: Payer: Self-pay

## 2024-09-29 ENCOUNTER — Ambulatory Visit (INDEPENDENT_AMBULATORY_CARE_PROVIDER_SITE_OTHER): Admitting: Family Medicine

## 2024-09-29 VITALS — BP 148/80 | HR 70 | Temp 97.6°F | Ht 66.0 in | Wt 185.0 lb

## 2024-09-29 DIAGNOSIS — R55 Syncope and collapse: Secondary | ICD-10-CM | POA: Diagnosis not present

## 2024-09-29 DIAGNOSIS — F988 Other specified behavioral and emotional disorders with onset usually occurring in childhood and adolescence: Secondary | ICD-10-CM | POA: Diagnosis not present

## 2024-09-29 DIAGNOSIS — S060X1A Concussion with loss of consciousness of 30 minutes or less, initial encounter: Secondary | ICD-10-CM | POA: Diagnosis not present

## 2024-09-29 MED ORDER — AMPHETAMINE-DEXTROAMPHETAMINE 10 MG PO TABS: 20.0000 mg | ORAL_TABLET | Freq: Two times a day (BID) | ORAL | 0 refills | Status: DC

## 2024-09-29 NOTE — Progress Notes (Unsigned)
 EP to read.

## 2024-09-29 NOTE — Progress Notes (Signed)
 Subjective:    Patient ID: Bonnie [name], female    DOB: [birth year], 53 y.o.   MRN: [id number]  Patient states that on Saturday, she was sitting at a table outside on her concrete patio.  It was time to go to bed.  She stood up to go to bed.  As soon as she stood up she instantly blacked out.  She fell striking her forehead on the concrete patio.  She woke up as soon as she had the patio.  She has a faded yellow brown-purple bruise on her forehead.  There is bruising around her left eye.  There are 2 abrasions on her forehead.  She denies being sick.  She states that she was not vomiting or having diarrhea prior to having this episode of passing out.  She is not taking a fluid pill.  She states that she was drinking plenty of water.  She denies drinking any alcohol or taking any other substance that would drop her blood pressure.  She states that she was just simply sitting at a table and passed out when she stood up.  She states that this has happened to her several times throughout her life.  It happened a lot when she was younger.  She also states that she had occasional seizure activity when she was younger but she never had a [place] seizure.  She denies any seizure activity with this most recent event.  After the fall, she does have a frontal headache.  However the headache is gradually getting better.  She reports feeling dizzy with activity and slightly nauseated with activity however this is getting better.  Headaches are not getting worse.  She denies any neurologic deficits. Past Medical History:  Diagnosis Date   Depression    Dysthymia 1999   Hypothyroidism    PCOS (polycystic ovarian syndrome) 2000   Prediabetes    Past Surgical History:  Procedure Laterality Date   CARPAL TUNNEL RELEASE Right 10/17/2015   DR. Whitfield   CARPAL TUNNEL RELEASE Left    CESAREAN SECTION     COSMETIC SURGERY     DILATION AND CURETTAGE OF UTERUS     ENDOMETRIAL ABLATION     FACIAL COSMETIC  SURGERY     oralmaxillofacial surgery, broke jaws    FOOT SURGERY     NECK SURGERY  May 2006   WISDOM TOOTH EXTRACTION     Current Outpatient Medications on File Prior to Visit  Medication Sig Dispense Refill   ALPRAZolam  (XANAX ) 0.5 MG tablet Take 1 tablet (0.5 mg total) by mouth at bedtime as needed for sleep. 20 tablet 0   buPROPion  (WELLBUTRIN  SR) 200 MG 12 hr tablet TAKE 1 TABLET BY MOUTH TWICE DAILY 180 tablet 3   chlorpheniramine-HYDROcodone  (TUSSIONEX) 10-8 MG/5ML Take 5 mLs by mouth every 12 (twelve) hours as needed. 120 mL 0   escitalopram  (LEXAPRO ) 10 MG tablet TAKE 1 TABLET(10 MG) BY MOUTH DAILY 30 tablet 5   estradiol (VIVELLE-DOT) 0.1 MG/24HR patch APPLY 1 PATCH TOPICALLY TO THE SKIN 2 TIMES A WEEK     fexofenadine (ALLEGRA) 180 MG tablet Take 180 mg by mouth daily.     furosemide  (LASIX ) 20 MG tablet Take 1 tablet (20 mg total) by mouth daily. 30 tablet 3   gabapentin  (NEURONTIN ) 300 MG capsule Take 1 capsule (300 mg total) by mouth 3 (three) times daily. 90 capsule 3   hydrOXYzine  (VISTARIL ) 25 MG capsule Take 1 capsule (25 mg total) by mouth  at bedtime as needed. 30 capsule 0   methylPREDNISolone  (MEDROL  DOSEPAK) 4 MG TBPK tablet 6 day dose pack - take as directed 21 tablet 0   montelukast  (SINGULAIR ) 10 MG tablet Take 1 tablet (10 mg total) by mouth at bedtime. 30 tablet 3   ondansetron  (ZOFRAN ) 4 MG tablet Take 1 tablet (4 mg total) by mouth every 8 (eight) hours as needed for nausea or vomiting. 20 tablet 0   progesterone (PROMETRIUM) 100 MG capsule TAKE 1 CAPSULE BY MOUTH EVERY DAY AT BEDTIME     silver  sulfADIAZINE  (SILVADENE ) 1 % cream Apply 1 Application topically daily. 25 g 1   SYNTHROID  150 MCG tablet Take 1 tablet (150 mcg total) by mouth daily before breakfast. PT REQUIRES NAME BRAND. 90 tablet 1   valACYclovir (VALTREX) 500 MG tablet Take 500 mg by mouth daily.     No current facility-administered medications on file prior to visit.   Allergies  Allergen  Reactions   Augmentin [Amoxicillin-Pot Clavulanate]     Muscle soreness   Diclofenac  Other (See Comments)    Got GERD   Levaquin [Levofloxacin In D5w]     Stomach cramps and diarrhea   Penicillins Other (See Comments)   Tramadol      Severe headache, no pain relief.   Social History   Socioeconomic History   Marital status: Married    Spouse name: Not on file   Number of children: 1   Years of education: Not on file   Highest education level: Not on file  Occupational History   Occupation: stay at home  Tobacco Use   Smoking status: Never   Smokeless tobacco: Never  Vaping Use   Vaping status: Never Used  Substance and Sexual Activity   Alcohol use: Not Currently    Comment: RARELY   Drug use: No   Sexual activity: Not on file  Other Topics Concern   Not on file  Social History Narrative   Not on file   Social Drivers of Health   Financial Resource Strain: Not on file  Food Insecurity: Not on file  Transportation Needs: Not on file  Physical Activity: Not on file  Stress: Not on file  Social Connections: Not on file  Intimate Partner Violence: Not on file     Review of Systems  Respiratory:  Positive for cough.   All other systems reviewed and are negative.      Objective:   Physical Exam Constitutional:      Appearance: Normal appearance. She is normal weight.  HENT:     Head: Abrasion and contusion present.     Jaw: There is normal jaw occlusion.      Right Ear: Tympanic membrane and ear canal normal.     Left Ear: Tympanic membrane and ear canal normal.     Mouth/Throat:     Pharynx: Oropharynx is clear.  Cardiovascular:     Rate and Rhythm: Normal rate and regular rhythm.     Heart sounds: Normal heart sounds.  Pulmonary:     Effort: No respiratory distress.     Breath sounds: Normal breath sounds. No stridor. No wheezing, rhonchi or rales.  Chest:     Chest wall: No tenderness.  Lymphadenopathy:     Cervical: No cervical adenopathy.   Neurological:     General: No focal deficit present.     Mental Status: She is alert and oriented to person, place, and time. Mental status is at baseline.  Cranial Nerves: No cranial nerve deficit.     Sensory: No sensory deficit.     Motor: No weakness.     Coordination: Coordination normal.     Gait: Gait normal.  Psychiatric:        Mood and Affect: Mood normal.        Behavior: Behavior normal.        Thought Content: Thought content normal.        Assessment & Plan:   Attention deficit disorder (ADD) without hyperactivity - Plan: amphetamine -dextroamphetamine  (ADDERALL) 10 MG tablet  Syncope, unspecified syncope type - Plan: CBC with Differential/Platelet, Comprehensive metabolic panel with GFR, TSH  Concussion with loss of consciousness of 30 minutes or less, initial encounter Patient has 2 separate problems.  First she suffered a concussion.  I believe the dizziness, the headaches, and the nausea are a direct result of the concussion she suffered when she hit her head on the pavement.  Thankfully, that occurred a week ago.  Therefore I am not concerned about a worsening intercranial hemorrhage.  I believe symptoms would have worsened much sooner if she were bleeding in her brain.  Therefore I do not feel that she needs emergent imaging of the brain.  I recommended rest and I believe that the postconcussive symptoms will gradually improve over the next 2 to 3 weeks.  However the second problem is the syncope.  It sounds like the patient had orthostatic hypotension for an unknown reason and caused her to pass out.  I plan on getting a long-term cardiac monitor to rule out cardiac arrhythmias as well as an echocardiogram.  If all this is normal, the patient may have POTS syndrome

## 2024-09-29 NOTE — Telephone Encounter (Signed)
 Per Delon, front office staff: Pharmacy doesn't have 10MG  of Adderrall which is was sent. They need a new script for 20 MG. Will you please advise Dr. SHAUNNA?

## 2024-09-30 LAB — COMPREHENSIVE METABOLIC PANEL WITH GFR
AG Ratio: 1.9 (calc) (ref 1.0–2.5)
ALT: 13 U/L (ref 6–29)
AST: 19 U/L (ref 10–35)
Albumin: 4 g/dL (ref 3.6–5.1)
Alkaline phosphatase (APISO): 76 U/L (ref 37–153)
BUN: 8 mg/dL (ref 7–25)
CO2: 25 mmol/L (ref 20–32)
Calcium: 9.1 mg/dL (ref 8.6–10.4)
Chloride: 104 mmol/L (ref 98–110)
Creat: 0.8 mg/dL (ref 0.50–1.03)
Globulin: 2.1 g/dL (ref 1.9–3.7)
Glucose, Bld: 109 mg/dL — ABNORMAL HIGH (ref 65–99)
Potassium: 3.9 mmol/L (ref 3.5–5.3)
Sodium: 140 mmol/L (ref 135–146)
Total Bilirubin: 0.4 mg/dL (ref 0.2–1.2)
Total Protein: 6.1 g/dL (ref 6.1–8.1)
eGFR: 88 mL/min/1.73m2 (ref >=60)

## 2024-09-30 LAB — CBC WITH DIFFERENTIAL/PLATELET
Absolute Lymphocytes: 1817 {cells}/uL (ref 850–3900)
Absolute Monocytes: 523 {cells}/uL (ref 200–950)
Basophils Absolute: 39 {cells}/uL (ref 0–200)
Basophils Relative: 0.5 %
Eosinophils Absolute: 351 {cells}/uL (ref 15–500)
Eosinophils Relative: 4.5 %
HCT: 44.3 % (ref 35.0–45.0)
Hemoglobin: 14.6 g/dL (ref 11.7–15.5)
MCH: 30.7 pg (ref 27.0–33.0)
MCHC: 33 g/dL (ref 32.0–36.0)
MCV: 93.3 fL (ref 80.0–100.0)
MPV: 10.5 fL (ref 7.5–12.5)
Monocytes Relative: 6.7 %
Neutro Abs: 5070 {cells}/uL (ref 1500–7800)
Neutrophils Relative %: 65 %
Platelets: 321 Thousand/uL (ref 140–400)
RBC: 4.75 Million/uL (ref 3.80–5.10)
RDW: 11.9 % (ref 11.0–15.0)
Total Lymphocyte: 23.3 %
WBC: 7.8 Thousand/uL (ref 3.8–10.8)

## 2024-09-30 LAB — TSH: TSH: 0.04 m[IU]/L — ABNORMAL LOW

## 2024-10-02 ENCOUNTER — Other Ambulatory Visit: Payer: Self-pay

## 2024-10-02 ENCOUNTER — Ambulatory Visit: Payer: Self-pay | Admitting: Family Medicine

## 2024-10-02 DIAGNOSIS — E039 Hypothyroidism, unspecified: Secondary | ICD-10-CM

## 2024-10-02 MED ORDER — SYNTHROID 100 MCG PO TABS: 100.0000 ug | ORAL_TABLET | Freq: Every day | ORAL | 3 refills | Status: AC

## 2024-12-18 ENCOUNTER — Other Ambulatory Visit: Payer: Self-pay | Admitting: Family Medicine

## 2024-12-18 DIAGNOSIS — F988 Other specified behavioral and emotional disorders with onset usually occurring in childhood and adolescence: Secondary | ICD-10-CM

## 2024-12-18 NOTE — Telephone Encounter (Unsigned)
 Copied from CRM #[id number]. Topic: Clinical - Medication Refill >> Dec 18, 2024  4:57 PM Winona R wrote: Medication: amphetamine -dextroamphetamine  (ADDERALL) 10 MG tablet  Has the patient contacted their pharmacy? Yes- no refills   (Agent: If no, request that the patient contact the pharmacy for the refill. If patient does not wish to contact the pharmacy document the reason why and proceed with request.) (Agent: If yes, when and what did the pharmacy advise?)  This is the patient's preferred pharmacy:  [id number] - [place], [place] - [address] [place] [address] Phone: [phone] Fax: [phone]  Is this the correct pharmacy for this prescription? Yes If no, delete pharmacy and type the correct one.   Has the prescription been filled recently? Yes  Is the patient out of the medication? Yes  Has the patient been seen for an appointment in the last year OR does the patient have an upcoming appointment? Yes  Can we respond through MyChart? Yes  Agent: Please be advised that Rx refills may take up to 3 business days. We ask that you follow-up with your pharmacy.

## 2024-12-20 MED ORDER — AMPHETAMINE-DEXTROAMPHETAMINE 10 MG PO TABS: 20.0000 mg | ORAL_TABLET | Freq: Two times a day (BID) | ORAL | 0 refills | Status: AC

## 2024-12-20 NOTE — Telephone Encounter (Signed)
 Requested medication (s) are due for refill today: yes  Requested medication (s) are on the active medication list: yes  Last refill:  09/29/24 #120  Future visit scheduled: no  Notes to clinic:  Med not delegated to NT to RF   Requested Prescriptions  Pending Prescriptions Disp Refills   amphetamine -dextroamphetamine  (ADDERALL) 10 MG tablet 120 tablet 0    Sig: Take 2 tablets (20 mg total) by mouth 2 (two) times daily.     Not Delegated - Psychiatry:  Stimulants/ADHD Failed - 12/20/2024 11:21 AM      Failed - This refill cannot be delegated      Failed - Urine Drug Screen completed in last 360 days      Failed - Last BP in normal range    BP Readings from Last 1 Encounters:  09/29/24 (!) 148/80         Failed - Valid encounter within last 6 months    Recent Outpatient Visits           2 months ago Attention deficit disorder (ADD) without hyperactivity   [place] [facility] Duanne, Butler DASEN, MD   4 months ago Acute cough   [place] [facility] Duanne Butler DASEN, MD   8 months ago Chills (without fever)   [place] [facility] Duanne Butler DASEN, MD   9 months ago Leg swelling   [place] [facility] Duanne Butler DASEN, MD   9 months ago Hypothyroidism, unspecified type   [place] [facility] Duanne Butler DASEN, MD              Passed - Last Heart Rate in normal range    Pulse Readings from Last 1 Encounters:  09/29/24 70
